# Patient Record
Sex: Female | Born: 1935 | Race: Black or African American | Hispanic: No | Marital: Married | Smoking: Never smoker
Health system: Southern US, Community
[De-identification: ages and names within clinical notes are randomized; demographics above are authoritative.]

## PROBLEM LIST (undated history)

## (undated) DIAGNOSIS — K222 Esophageal obstruction: Secondary | ICD-10-CM

## (undated) DIAGNOSIS — T8859XA Other complications of anesthesia, initial encounter: Secondary | ICD-10-CM

## (undated) DIAGNOSIS — K2940 Chronic atrophic gastritis without bleeding: Secondary | ICD-10-CM

## (undated) DIAGNOSIS — K219 Gastro-esophageal reflux disease without esophagitis: Secondary | ICD-10-CM

## (undated) DIAGNOSIS — I493 Ventricular premature depolarization: Secondary | ICD-10-CM

## (undated) DIAGNOSIS — K902 Blind loop syndrome, not elsewhere classified: Secondary | ICD-10-CM

## (undated) DIAGNOSIS — I10 Essential (primary) hypertension: Secondary | ICD-10-CM

## (undated) DIAGNOSIS — K760 Fatty (change of) liver, not elsewhere classified: Secondary | ICD-10-CM

## (undated) DIAGNOSIS — T4145XA Adverse effect of unspecified anesthetic, initial encounter: Secondary | ICD-10-CM

## (undated) DIAGNOSIS — I341 Nonrheumatic mitral (valve) prolapse: Secondary | ICD-10-CM

## (undated) DIAGNOSIS — D649 Anemia, unspecified: Secondary | ICD-10-CM

## (undated) DIAGNOSIS — K449 Diaphragmatic hernia without obstruction or gangrene: Secondary | ICD-10-CM

## (undated) DIAGNOSIS — E538 Deficiency of other specified B group vitamins: Secondary | ICD-10-CM

## (undated) DIAGNOSIS — K56609 Unspecified intestinal obstruction, unspecified as to partial versus complete obstruction: Secondary | ICD-10-CM

## (undated) DIAGNOSIS — M1990 Unspecified osteoarthritis, unspecified site: Secondary | ICD-10-CM

## (undated) DIAGNOSIS — F419 Anxiety disorder, unspecified: Secondary | ICD-10-CM

## (undated) DIAGNOSIS — T7840XA Allergy, unspecified, initial encounter: Secondary | ICD-10-CM

## (undated) HISTORY — PX: TUBAL LIGATION: SHX77

## (undated) HISTORY — PX: REPLACEMENT TOTAL KNEE: SUR1224

## (undated) HISTORY — DX: Nonrheumatic mitral (valve) prolapse: I34.1

## (undated) HISTORY — DX: Anxiety disorder, unspecified: F41.9

## (undated) HISTORY — DX: Chronic atrophic gastritis without bleeding: K29.40

## (undated) HISTORY — DX: Essential (primary) hypertension: I10

## (undated) HISTORY — PX: HEMICOLECTOMY: SHX854

## (undated) HISTORY — DX: Anemia, unspecified: D64.9

## (undated) HISTORY — DX: Ventricular premature depolarization: I49.3

## (undated) HISTORY — DX: Diaphragmatic hernia without obstruction or gangrene: K44.9

## (undated) HISTORY — DX: Blind loop syndrome, not elsewhere classified: K90.2

## (undated) HISTORY — DX: Fatty (change of) liver, not elsewhere classified: K76.0

## (undated) HISTORY — DX: Esophageal obstruction: K22.2

## (undated) HISTORY — DX: Unspecified osteoarthritis, unspecified site: M19.90

## (undated) HISTORY — DX: Deficiency of other specified B group vitamins: E53.8

## (undated) HISTORY — DX: Gastro-esophageal reflux disease without esophagitis: K21.9

## (undated) HISTORY — DX: Allergy, unspecified, initial encounter: T78.40XA

---

## 1997-08-20 ENCOUNTER — Other Ambulatory Visit: Admission: RE | Admit: 1997-08-20 | Discharge: 1997-08-20 | Payer: Self-pay | Admitting: Obstetrics and Gynecology

## 1998-08-27 ENCOUNTER — Other Ambulatory Visit: Admission: RE | Admit: 1998-08-27 | Discharge: 1998-08-27 | Payer: Self-pay | Admitting: Obstetrics and Gynecology

## 1998-11-02 ENCOUNTER — Emergency Department (HOSPITAL_COMMUNITY): Admission: EM | Admit: 1998-11-02 | Discharge: 1998-11-02 | Payer: Self-pay | Admitting: *Deleted

## 1998-11-09 ENCOUNTER — Emergency Department (HOSPITAL_COMMUNITY): Admission: EM | Admit: 1998-11-09 | Discharge: 1998-11-09 | Payer: Self-pay | Admitting: Emergency Medicine

## 1999-09-01 ENCOUNTER — Other Ambulatory Visit: Admission: RE | Admit: 1999-09-01 | Discharge: 1999-09-01 | Payer: Self-pay | Admitting: Obstetrics and Gynecology

## 2000-09-06 ENCOUNTER — Other Ambulatory Visit: Admission: RE | Admit: 2000-09-06 | Discharge: 2000-09-06 | Payer: Self-pay | Admitting: Obstetrics and Gynecology

## 2001-11-15 ENCOUNTER — Encounter: Payer: Self-pay | Admitting: Emergency Medicine

## 2001-11-15 ENCOUNTER — Emergency Department (HOSPITAL_COMMUNITY): Admission: EM | Admit: 2001-11-15 | Discharge: 2001-11-15 | Payer: Self-pay | Admitting: Emergency Medicine

## 2002-01-06 ENCOUNTER — Encounter: Payer: Self-pay | Admitting: Emergency Medicine

## 2002-01-06 ENCOUNTER — Emergency Department (HOSPITAL_COMMUNITY): Admission: EM | Admit: 2002-01-06 | Discharge: 2002-01-06 | Payer: Self-pay | Admitting: Emergency Medicine

## 2002-07-17 ENCOUNTER — Encounter: Payer: Self-pay | Admitting: Otolaryngology

## 2002-07-17 ENCOUNTER — Encounter: Admission: RE | Admit: 2002-07-17 | Discharge: 2002-07-17 | Payer: Self-pay | Admitting: Otolaryngology

## 2003-06-25 ENCOUNTER — Ambulatory Visit (HOSPITAL_COMMUNITY): Admission: RE | Admit: 2003-06-25 | Discharge: 2003-06-25 | Payer: Self-pay | Admitting: Orthopedic Surgery

## 2003-07-25 ENCOUNTER — Inpatient Hospital Stay (HOSPITAL_COMMUNITY): Admission: RE | Admit: 2003-07-25 | Discharge: 2003-07-26 | Payer: Self-pay | Admitting: Orthopedic Surgery

## 2004-04-29 ENCOUNTER — Ambulatory Visit: Payer: Self-pay | Admitting: Gastroenterology

## 2004-07-01 ENCOUNTER — Ambulatory Visit (HOSPITAL_COMMUNITY): Admission: RE | Admit: 2004-07-01 | Discharge: 2004-07-01 | Payer: Self-pay | Admitting: Internal Medicine

## 2004-08-13 ENCOUNTER — Ambulatory Visit: Payer: Self-pay | Admitting: Gastroenterology

## 2004-09-09 ENCOUNTER — Ambulatory Visit: Payer: Self-pay | Admitting: Gastroenterology

## 2004-12-16 ENCOUNTER — Ambulatory Visit: Payer: Self-pay | Admitting: Gastroenterology

## 2005-01-09 ENCOUNTER — Ambulatory Visit: Payer: Self-pay | Admitting: Gastroenterology

## 2005-07-06 ENCOUNTER — Ambulatory Visit (HOSPITAL_COMMUNITY): Admission: RE | Admit: 2005-07-06 | Discharge: 2005-07-06 | Payer: Self-pay | Admitting: Internal Medicine

## 2005-08-21 ENCOUNTER — Ambulatory Visit: Payer: Self-pay | Admitting: Gastroenterology

## 2005-08-26 ENCOUNTER — Ambulatory Visit: Payer: Self-pay | Admitting: Gastroenterology

## 2006-07-28 ENCOUNTER — Emergency Department (HOSPITAL_COMMUNITY): Admission: EM | Admit: 2006-07-28 | Discharge: 2006-07-28 | Payer: Self-pay | Admitting: Family Medicine

## 2006-08-27 ENCOUNTER — Encounter: Payer: Self-pay | Admitting: Gastroenterology

## 2007-07-25 ENCOUNTER — Encounter: Payer: Self-pay | Admitting: Gastroenterology

## 2007-07-25 DIAGNOSIS — K2940 Chronic atrophic gastritis without bleeding: Secondary | ICD-10-CM | POA: Insufficient documentation

## 2007-07-25 DIAGNOSIS — K219 Gastro-esophageal reflux disease without esophagitis: Secondary | ICD-10-CM

## 2007-07-25 DIAGNOSIS — E538 Deficiency of other specified B group vitamins: Secondary | ICD-10-CM

## 2007-07-25 DIAGNOSIS — K630 Abscess of intestine: Secondary | ICD-10-CM

## 2007-07-25 DIAGNOSIS — I4949 Other premature depolarization: Secondary | ICD-10-CM

## 2007-07-25 DIAGNOSIS — K449 Diaphragmatic hernia without obstruction or gangrene: Secondary | ICD-10-CM | POA: Insufficient documentation

## 2007-07-25 DIAGNOSIS — I059 Rheumatic mitral valve disease, unspecified: Secondary | ICD-10-CM | POA: Insufficient documentation

## 2007-07-25 DIAGNOSIS — I10 Essential (primary) hypertension: Secondary | ICD-10-CM

## 2007-07-25 DIAGNOSIS — K210 Gastro-esophageal reflux disease with esophagitis: Secondary | ICD-10-CM

## 2007-07-26 ENCOUNTER — Ambulatory Visit: Payer: Self-pay | Admitting: Gastroenterology

## 2007-07-26 DIAGNOSIS — R498 Other voice and resonance disorders: Secondary | ICD-10-CM | POA: Insufficient documentation

## 2007-08-29 ENCOUNTER — Telehealth: Payer: Self-pay | Admitting: Gastroenterology

## 2008-07-17 ENCOUNTER — Ambulatory Visit: Payer: Self-pay | Admitting: Gastroenterology

## 2008-07-17 DIAGNOSIS — R197 Diarrhea, unspecified: Secondary | ICD-10-CM

## 2008-07-19 ENCOUNTER — Telehealth: Payer: Self-pay | Admitting: Gastroenterology

## 2008-12-24 ENCOUNTER — Telehealth: Payer: Self-pay | Admitting: Gastroenterology

## 2008-12-25 ENCOUNTER — Encounter: Payer: Self-pay | Admitting: Gastroenterology

## 2008-12-25 ENCOUNTER — Ambulatory Visit: Payer: Self-pay | Admitting: Internal Medicine

## 2008-12-25 DIAGNOSIS — R1319 Other dysphagia: Secondary | ICD-10-CM

## 2008-12-25 DIAGNOSIS — R634 Abnormal weight loss: Secondary | ICD-10-CM

## 2008-12-25 LAB — CONVERTED CEMR LAB
ALT: 20 units/L (ref 0–35)
AST: 24 units/L (ref 0–37)
Albumin: 4 g/dL (ref 3.5–5.2)
Alkaline Phosphatase: 47 units/L (ref 39–117)
Basophils Absolute: 0 10*3/uL (ref 0.0–0.1)
Calcium: 9.6 mg/dL (ref 8.4–10.5)
Creatinine, Ser: 1.1 mg/dL (ref 0.4–1.2)
Eosinophils Absolute: 0.1 10*3/uL (ref 0.0–0.7)
GFR calc non Af Amer: 61.7 mL/min (ref >60)
Glucose, Bld: 98 mg/dL (ref 70–99)
HCT: 37.4 % (ref 36.0–46.0)
Hemoglobin: 12.4 g/dL (ref 12.0–15.0)
Lymphs Abs: 0.8 10*3/uL (ref 0.7–4.0)
Monocytes Absolute: 0.2 10*3/uL (ref 0.1–1.0)
Neutro Abs: 2.2 10*3/uL (ref 1.4–7.7)
Neutrophils Relative %: 69.6 % (ref 43.0–77.0)
Potassium: 4.7 meq/L (ref 3.5–5.1)
RBC: 4.35 M/uL (ref 3.87–5.11)
Total Bilirubin: 0.6 mg/dL (ref 0.3–1.2)
Total Protein: 7.2 g/dL (ref 6.0–8.3)
WBC: 3.3 10*3/uL — ABNORMAL LOW (ref 4.5–10.5)

## 2008-12-26 ENCOUNTER — Ambulatory Visit: Payer: Self-pay | Admitting: Gastroenterology

## 2008-12-26 DIAGNOSIS — K2970 Gastritis, unspecified, without bleeding: Secondary | ICD-10-CM | POA: Insufficient documentation

## 2008-12-26 DIAGNOSIS — K2990 Gastroduodenitis, unspecified, without bleeding: Secondary | ICD-10-CM

## 2008-12-31 ENCOUNTER — Encounter: Payer: Self-pay | Admitting: Gastroenterology

## 2009-07-17 ENCOUNTER — Telehealth: Payer: Self-pay | Admitting: Gastroenterology

## 2009-08-19 ENCOUNTER — Telehealth: Payer: Self-pay | Admitting: Gastroenterology

## 2009-08-26 ENCOUNTER — Ambulatory Visit: Payer: Self-pay | Admitting: Gastroenterology

## 2009-08-26 DIAGNOSIS — K902 Blind loop syndrome, not elsewhere classified: Secondary | ICD-10-CM

## 2009-08-26 LAB — CONVERTED CEMR LAB: Magnesium: 1.9 mg/dL (ref 1.5–2.5)

## 2009-09-03 ENCOUNTER — Ambulatory Visit (HOSPITAL_COMMUNITY): Admission: RE | Admit: 2009-09-03 | Discharge: 2009-09-03 | Payer: Self-pay | Admitting: Gastroenterology

## 2009-09-09 ENCOUNTER — Ambulatory Visit: Payer: Self-pay | Admitting: Cardiovascular Disease

## 2009-09-20 ENCOUNTER — Telehealth (INDEPENDENT_AMBULATORY_CARE_PROVIDER_SITE_OTHER): Payer: Self-pay | Admitting: *Deleted

## 2010-01-23 ENCOUNTER — Encounter: Payer: Self-pay | Admitting: Gastroenterology

## 2010-01-23 ENCOUNTER — Encounter (INDEPENDENT_AMBULATORY_CARE_PROVIDER_SITE_OTHER): Payer: Self-pay | Admitting: *Deleted

## 2010-01-23 ENCOUNTER — Ambulatory Visit
Admission: RE | Admit: 2010-01-23 | Discharge: 2010-01-23 | Payer: Self-pay | Source: Home / Self Care | Attending: Gastroenterology | Admitting: Gastroenterology

## 2010-01-23 ENCOUNTER — Other Ambulatory Visit: Payer: Self-pay | Admitting: Gastroenterology

## 2010-01-23 DIAGNOSIS — R1310 Dysphagia, unspecified: Secondary | ICD-10-CM | POA: Insufficient documentation

## 2010-01-23 LAB — HEPATIC FUNCTION PANEL
ALT: 22 U/L (ref 0–35)
AST: 28 U/L (ref 0–37)
Albumin: 3.8 g/dL (ref 3.5–5.2)
Alkaline Phosphatase: 60 U/L (ref 39–117)
Bilirubin, Direct: 0.1 mg/dL (ref 0.0–0.3)
Total Bilirubin: 0.3 mg/dL (ref 0.3–1.2)
Total Protein: 7.2 g/dL (ref 6.0–8.3)

## 2010-01-23 LAB — CBC WITH DIFFERENTIAL/PLATELET
Basophils Absolute: 0 10*3/uL (ref 0.0–0.1)
Basophils Relative: 0 % (ref 0.0–3.0)
Eosinophils Absolute: 0.1 10*3/uL (ref 0.0–0.7)
Eosinophils Relative: 1.2 % (ref 0.0–5.0)
HCT: 37.1 % (ref 36.0–46.0)
Hemoglobin: 12.2 g/dL (ref 12.0–15.0)
Lymphocytes Relative: 20.7 % (ref 12.0–46.0)
Lymphs Abs: 0.9 10*3/uL (ref 0.7–4.0)
MCHC: 32.9 g/dL (ref 30.0–36.0)
MCV: 85.8 fl (ref 78.0–100.0)
Monocytes Absolute: 0.3 10*3/uL (ref 0.1–1.0)
Monocytes Relative: 5.9 % (ref 3.0–12.0)
Neutro Abs: 3.1 10*3/uL (ref 1.4–7.7)
Neutrophils Relative %: 72.2 % (ref 43.0–77.0)
Platelets: 222 10*3/uL (ref 150.0–400.0)
RBC: 4.32 Mil/uL (ref 3.87–5.11)
RDW: 14.7 % — ABNORMAL HIGH (ref 11.5–14.6)
WBC: 4.3 10*3/uL — ABNORMAL LOW (ref 4.5–10.5)

## 2010-01-23 LAB — BASIC METABOLIC PANEL
BUN: 10 mg/dL (ref 6–23)
CO2: 24 mEq/L (ref 19–32)
Calcium: 9.4 mg/dL (ref 8.4–10.5)
Chloride: 103 mEq/L (ref 96–112)
Creatinine, Ser: 0.9 mg/dL (ref 0.4–1.2)
GFR: 75.62 mL/min (ref >60.00)
Glucose, Bld: 89 mg/dL (ref 70–99)
Potassium: 4.6 mEq/L (ref 3.5–5.1)
Sodium: 135 mEq/L (ref 135–145)

## 2010-01-23 LAB — TSH: TSH: 1.79 u[IU]/mL (ref 0.35–5.50)

## 2010-01-23 LAB — IBC PANEL
Iron: 61 ug/dL (ref 42–145)
Saturation Ratios: 13.6 % — ABNORMAL LOW (ref 20.0–50.0)
Transferrin: 319.3 mg/dL (ref 212.0–360.0)

## 2010-01-23 LAB — SEDIMENTATION RATE: Sed Rate: 29 mm/hr — ABNORMAL HIGH (ref 0–22)

## 2010-01-23 LAB — IGA: IgA: 275 mg/dL (ref 68–378)

## 2010-01-23 LAB — FOLATE: Folate: 11.7 ng/mL

## 2010-01-23 LAB — FERRITIN: Ferritin: 30.4 ng/mL (ref 10.0–291.0)

## 2010-01-23 LAB — VITAMIN B12: Vitamin B-12: 620 pg/mL (ref 211–911)

## 2010-01-23 LAB — CONVERTED CEMR LAB: Tissue Transglutaminase Ab, IgA: 4.6 units (ref <20)

## 2010-01-27 ENCOUNTER — Ambulatory Visit
Admission: RE | Admit: 2010-01-27 | Discharge: 2010-01-27 | Payer: Self-pay | Source: Home / Self Care | Attending: Gastroenterology | Admitting: Gastroenterology

## 2010-01-27 ENCOUNTER — Encounter: Payer: Self-pay | Admitting: Gastroenterology

## 2010-02-20 NOTE — Assessment & Plan Note (Signed)
Summary: food getting stuck, diarrhea, stomach pain, horseness..em    History of Present Illness Visit Type: Follow-up Visit Primary GI MD: Sheryn Bison MD FACP FAGA Primary Provider: Creola Corn, MD Requesting Provider: na  Chief Complaint: dysphagia, diarrhea, some hoarseness History of Present Illness:   77 year old African American female with chronic active liver growth related to previous right hemicolectomy. She continued with gas, bloating, and constipation. She also complains of new onset dysphagia for solid foods in her retrosternal area. She had endoscopy and dilation one year ago. She has known lactose intolerance and has chronic malabsorption of non-digested carbohydrates. With her gas ablating his vague abdominal discomfort but no rectal bleeding. She is up-to-date on her colonoscopy exams. She denies anorexia, weight loss, or systemic complaints.   GI Review of Systems    Reports abdominal pain, acid reflux, bloating, and  dysphagia with solids.     Location of  Abdominal pain: lower abdomen.    Denies belching, chest pain, dysphagia with liquids, heartburn, loss of appetite, nausea, vomiting, vomiting blood, weight loss, and  weight gain.      Reports diarrhea.     Denies anal fissure, black tarry stools, change in bowel habit, constipation, diverticulosis, fecal incontinence, heme positive stool, hemorrhoids, irritable bowel syndrome, jaundice, light color stool, liver problems, rectal bleeding, and  rectal pain.    Current Medications (verified): 1)  Altace 10 Mg  Caps (Ramipril) .... One Tablet By Mouth Two Times A Day 2)  Vitamin C 500 Mg  Tabs (Ascorbic Acid) .... One Tablet By Mouth Once Daily 3)  Tylenol Ex St Arthritis Pain 500 Mg  Tabs (Acetaminophen) .... Two Tablets By Mouth Once Daily 4)  Omega 3 Capsule (Unknown Dosage) .... Take 3 Capsules By Mouth Once Daily 5)  Vitamin D (Ergocalciferol) 50000 Unit Caps (Ergocalciferol) .... Take 1 Tablet By Mouth Every  Week 6)  Dexilant 60 Mg Cpdr (Dexlansoprazole) .... Take 1 Capsule Daily 30 Min Prior To Breakfast 7)  Ensure  Liqd (Nutritional Supplements) .... One in The Morning and One At Night 8)  [facility] Colon Health  Caps (Probiotic Product) .... Take 1 Capsule By Mouth Two Times A Day 9)  Alprazolam 0.25 Mg Tabs (Alprazolam) .... Take 1 Tablet By Mouth Once Daily As Needed 10)  Align 4 Mg Caps (Probiotic Product) .... Take 1 Capsule By Mouth Once Daily 11)  Alka-Seltzer [phone] Mg Tbef (Aspirin Effervescent) .... As Needed 12)  Tums Calcium For Life Bone 750 Mg Chew (Calcium Carbonate Antacid) .... As Needed 13)  Beano To Go  Tabs (Alpha-D-Galactosidase) .... As Needed 14)  Geri-Lanta 200-200-20 Mg/73ml Susp (Alum & Mag Hydroxide-Simeth) .... As Needed  Allergies (verified): No Known Drug Allergies  Past History:  Past medical, surgical, family and social histories (including risk factors) reviewed for relevance to current acute and chronic problems.  Past Medical History: Reviewed history from 07/26/2007 and no changes required. Current Problems:  HOARSENESS (ICD-784.49) ABSCESS OF INTESTINE (ICD-569.5) PREMATURE VENTRICULAR CONTRACTIONS (ICD-427.69) MITRAL VALVE PROLAPSE (ICD-424.0) B12 DEFICIENCY (ICD-266.2) HYPERTENSION (ICD-401.9) GERD (ICD-530.81) HIATAL HERNIA (ICD-553.3) ESOPHAGITIS, REFLUX (ICD-530.11) GASTRITIS, CHRONIC (ICD-535.10)  Past Surgical History: Reviewed history from 07/25/2007 and no changes required. right hemicolectomy and ileectomy  Dr. Rolene Course  Family History: Reviewed history from 07/17/2008 and no changes required. Family History of Diabetes: Sister Family History of Heart Disease: Brother, Neices x 2 No FH of Colon Cancer: Family History of Kidney Disease: Neice x 2  Social History: Reviewed history from 07/17/2008 and no changes required.  Alcohol Use - no Occupation: retired Patient has never smoked.  Illicit Drug Use - no Patient gets  regular exercise.  Review of Systems  The patient denies allergy/sinus, anemia, anxiety-new, arthritis/joint pain, back pain, blood in urine, breast changes/lumps, change in vision, confusion, cough, coughing up blood, depression-new, fainting, fatigue, fever, headaches-new, hearing problems, heart murmur, heart rhythm changes, itching, menstrual pain, muscle pains/cramps, night sweats, nosebleeds, pregnancy symptoms, shortness of breath, skin rash, sleeping problems, sore throat, swelling of feet/legs, swollen lymph glands, thirst - excessive , urination - excessive , urination changes/pain, urine leakage, vision changes, and voice change.    Vital Signs:  Patient profile:   77 year old female Height:      65 inches Weight:      161.25 pounds BMI:     26.93 Pulse rate:   68 / minute Pulse rhythm:   regular BP sitting:   128 / 80  (left arm) Cuff size:   regular  Vitals Entered By: June McMurray CMA Duncan Dull) (January 23, 2010 10:29 AM)  Physical Exam  General:  Well developed, well nourished, no acute distress.healthy appearing. She appears younger than her stated age.  Head:  Normocephalic and atraumatic. Eyes:  PERRLA, no icterus.exam deferred to patient's ophthalmologist.   Neck:  Supple; no masses or thyromegaly. Lungs:  Clear throughout to auscultation. Heart:  Regular rate and rhythm; no murmurs, rubs,  or bruits. Abdomen:  Soft, nontender and nondistended. No masses, hepatosplenomegaly or hernias noted. Normal bowel sounds. Extremities:  No clubbing, cyanosis, edema or deformities noted. Neurologic:  Alert and  oriented x4;  grossly normal neurologically. Cervical Nodes:  No significant cervical adenopathy. Psych:  Alert and cooperative. Normal mood and affect.   Impression & Recommendations:  Problem # 1:  DYSPHAGIA UNSPECIFIED (ICD-787.20) Assessment Deteriorated Probable recurrent esophageal stricture from chronic GERD. She requested discontinuation of Dexilant and trial  of Nexium 40 mg a day. Endoscopy and esophageal dilation schedule her convenience. Standard anti-reflux maneuvers reviewed. Orders: TLB-CBC Platelet - w/Differential (85025-CBCD) TLB-BMP (Basic Metabolic Panel-BMET) (80048-METABOL) TLB-Hepatic/Liver Function Pnl (80076-HEPATIC) TLB-TSH (Thyroid Stimulating Hormone) (84443-TSH) TLB-B12, Serum-Total ONLY (29562-Z30) TLB-Ferritin (82728-FER) TLB-Folic Acid (Folate) (82746-FOL) TLB-IBC Pnl (Iron/FE;Transferrin) (83550-IBC) TLB-Sedimentation Rate (ESR) (85652-ESR) T-Beta Carotene [phone]) T-Sprue Panel (Celiac Disease Aby Eval) (83516x3/[address]) TLB-IgA (Immunoglobulin A) (82784-IGA) EGD (EGD)  Problem # 2:  BLIND LOOP SYNDROME (ICD-579.2) Assessment: Deteriorated Trial of Xifaxan 550 mg b.i.d. for 2 weeks. We will continue probiotic therapy. She has had excellent response in the past to this therapy. Also advised to continue Ensure supplementation and multivitamins.  Problem # 3:  WEIGHT LOSS-ABNORMAL (ICD-783.21) Assessment: Improved  Problem # 4:  HOARSENESS (ICD-784.49) Assessment: Improved Related chronic GERD and seems to be improving with regular PPI therapy.  Problem # 5:  B12 DEFICIENCY (ICD-266.2) Assessment: Improved Continue parenteral replacement therapy as needed. His B12 deficiency is associated with her bacterial overgrowth syndrome.Repeat anemia profile ordered.  Patient Instructions: 1)  Copy sent to : Creola Corn, MD 2)  Your prescription(s) have been sent to you pharmacy.  3)  Please go to the basement today for your labs.  4)  Your procedure has been scheduled for 01/27/2010, please follow the seperate instructions.  5)  [facility] Patient Information Guide given to patient.  6)  Upper Endoscopy brochure given.  7)  The medication list was reviewed and reconciled.  All changed / newly prescribed medications were explained.  A complete medication list was provided to the patient / caregiver. 8)  Avoid foods high in acid content ( tomatoes, citrus juices, spicy foods) . Avoid eating within 3 to 4 hours of lying down or before exercising. Do not over eat; try smaller more frequent meals. Elevate head of bed four inches when sleeping.  Prescriptions: XIFAXAN 550 MG TABS (RIFAXIMIN) take one by mouth two times a day for 10 days  #20 x 0   Entered by:   Harlow Mares CMA (AAMA)   Authorized by:   Mardella Layman MD [facility]   Signed by:   Harlow Mares CMA (AAMA) on 01/23/2010   Method used:   Electronically to        [facility]  [address] [phone]* (retail)       [address]       [place], [place]  [id number]       Ph: [phone] or [phone]       Fax: [phone]   RxID:   [id number]

## 2010-02-20 NOTE — Progress Notes (Signed)
Summary: Samples of Dexilant  Medications Added DEXILANT 60 MG CPDR (DEXLANSOPRAZOLE) Take 1 capsule daily 30 min prior to breakfast       Phone Note Call from Patient Call back at Home Phone [phone]   Caller: Patient Call For: Dr. Jarold Motto Reason for Call: Talk to Nurse Summary of Call: pt is asking for samples of Dexilant Initial call taken by: Karna Christmas,  July 17, 2009 2:40 PM  Follow-up for Phone Call        Samples given. Follow-up by: Ashok Cordia RN,  July 17, 2009 3:21 PM    New/Updated Medications: DEXILANT 60 MG CPDR (DEXLANSOPRAZOLE) Take 1 capsule daily 30 min prior to breakfast

## 2010-02-20 NOTE — Assessment & Plan Note (Signed)
Summary: Loss of appetite/dfs    History of Present Illness Visit Type: Follow-up Visit Primary GI MD: Sheryn Bison MD FACP FAGA Primary Provider: Creola Corn, MD Requesting Provider: na  Chief Complaint: loss of appetite and bad taste in her mouth  History of Present Illness:   pleasant 77 year old African American female followed for many years with bacterial overgrowth related to previous right hemicolectomy from an ileal abscess many years ago. She is up-to-date on her colonoscopy exams. She is followed by Dr. Timothy Lasso in  primary care and otherwise is doing well taking daily Dexilant 60 mg for chronic GERD, Altace 10 mg a day for hypertension, and vitamin D.  Then a gastrointestinal complaints except for loss of appetite but no associated weight loss. She is taking Ensure one can twice a day. Chronically she has gas and bloating but denies current bowel irregularity, melena or hematochezia. She is on B12 replacement therapy. Last ultrasound was 7 years ago.She Denies any hepatobiliary symptomatology.   GI Review of Systems    Reports loss of appetite.      Denies abdominal pain, acid reflux, belching, bloating, chest pain, dysphagia with liquids, dysphagia with solids, heartburn, nausea, vomiting, vomiting blood, weight loss, and  weight gain.        Denies anal fissure, black tarry stools, change in bowel habit, constipation, diarrhea, diverticulosis, fecal incontinence, heme positive stool, hemorrhoids, irritable bowel syndrome, jaundice, light color stool, liver problems, rectal bleeding, and  rectal pain.    Current Medications (verified): 1)  Altace 10 Mg  Caps (Ramipril) .... One Tablet By Mouth Two Times A Day 2)  Vitamin C 500 Mg  Tabs (Ascorbic Acid) .... One Tablet By Mouth Once Daily 3)  Tylenol Ex St Arthritis Pain 500 Mg  Tabs (Acetaminophen) .... Two Tablets By Mouth Once Daily 4)  Omega 3 Capsule (Unknown Dosage) .... Take 3 Capsules By Mouth Once Daily 5)  Vitamin D  (Ergocalciferol) 50000 Unit Caps (Ergocalciferol) .... Take 1 Tablet By Mouth Every Week 6)  Dexilant 60 Mg Cpdr (Dexlansoprazole) .... Take 1 Capsule Daily 30 Min Prior To Breakfast 7)  Ensure  Liqd (Nutritional Supplements) .... One in The Morning and One At Night  Allergies (verified): No Known Drug Allergies  Past History:  Past medical, surgical, family and social histories (including risk factors) reviewed for relevance to current acute and chronic problems.  Past Medical History: Reviewed history from 07/26/2007 and no changes required. Current Problems:  HOARSENESS (ICD-784.49) ABSCESS OF INTESTINE (ICD-569.5) PREMATURE VENTRICULAR CONTRACTIONS (ICD-427.69) MITRAL VALVE PROLAPSE (ICD-424.0) B12 DEFICIENCY (ICD-266.2) HYPERTENSION (ICD-401.9) GERD (ICD-530.81) HIATAL HERNIA (ICD-553.3) ESOPHAGITIS, REFLUX (ICD-530.11) GASTRITIS, CHRONIC (ICD-535.10)  Past Surgical History: Reviewed history from 07/25/2007 and no changes required. right hemicolectomy and ileectomy  Dr. Rolene Course  Family History: Reviewed history from 07/17/2008 and no changes required. Family History of Diabetes: Sister Family History of Heart Disease: Brother, Neices x 2 No FH of Colon Cancer: Family History of Kidney Disease: Neice x 2  Social History: Reviewed history from 07/17/2008 and no changes required. Alcohol Use - no Occupation: retired Patient has never smoked.  Illicit Drug Use - no Patient gets regular exercise.  Review of Systems       The patient complains of anxiety-new, arthritis/joint pain, back pain, depression-new, fatigue, and headaches-new.  The patient denies allergy/sinus, anemia, blood in urine, breast changes/lumps, change in vision, confusion, cough, coughing up blood, fainting, fever, hearing problems, heart murmur, heart rhythm changes, itching, menstrual pain, muscle pains/cramps, night sweats, nosebleeds,  pregnancy symptoms, shortness of breath, skin rash, sleeping  problems, sore throat, swelling of feet/legs, swollen lymph glands, thirst - excessive , urination - excessive , urination changes/pain, urine leakage, vision changes, and voice change.    Vital Signs:  Patient profile:   77 year old female Height:      65 inches Weight:      164 pounds BMI:     27.39 BSA:     1.82 Pulse rate:   76 / minute Pulse rhythm:   regular BP sitting:   132 / 84  (left arm) Cuff size:   regular  Vitals Entered By: Ok Anis CMA (August 26, 2009 2:26 PM)  Physical Exam  General:  Well developed, well nourished, no acute distress.healthy appearing.   Head:  Normocephalic and atraumatic. Eyes:  PERRLA, no icterus.exam deferred to patient's ophthalmologist.   Mouth:  No deformity or lesions, dentition normal. Lungs:  Clear throughout to auscultation. Heart:  Regular rate and rhythm; no murmurs, rubs,  or bruits. Abdomen:  Soft, nontender and nondistended. No masses, hepatosplenomegaly or hernias noted. Normal bowel sounds.Multiple well-healed surgical scars visible. Bowel sounds are nonobstructive in nature. Extremities:  No clubbing, cyanosis, edema or deformities noted. Neurologic:  Alert and  oriented x4;  grossly normal neurologically. Psych:  Alert and cooperative. Normal mood and affect.   Impression & Recommendations:  Problem # 1:  BLIND LOOP SYNDROME (ICD-579.2) Assessment Improved Trial of [employer] Colon Health probiotic tabs twice a day. Orders: TLB-Magnesium (Mg) (83735-MG) T- * Misc. Laboratory test [phone]) T- * Misc. Laboratory test [phone]) Ultrasound Abdomen (UAS)  Problem # 2:  WEIGHT LOSS-ABNORMAL (ICD-783.21) ultrasound tech that of upper abdomen scheduled. Orders: TLB-Magnesium (Mg) (83735-MG) T- * Misc. Laboratory test [phone]) T- * Misc. Laboratory test [phone]) Ultrasound Abdomen (UAS)  Problem # 3:  B12 DEFICIENCY (ICD-266.2) Assessment: Improved Continue replacement therapy. Because of a constant bad taste in her mouth  despite PPI therapy have ordered serum zinc, magnesium, and thiamine levels.  Problem # 4:  GERD (ICD-530.81) Assessment: Improved Continue Exelon 60 mg a day as tolerated and Ensure p.o. supplementation.  Patient Instructions: 1)  Please go to the basement to have your lab tests drawn today.  2)  Your Abdominal Ultrasound is scheduled at [facility].  See seperate instructions. 3)  We will call you with further follow up after reviewing these results. 4)  Please purchase [employer] [facility] at your pharmacy.  You will need to take this two times a day. 5)  The medication list was reviewed and reconciled.  All changed / newly prescribed medications were explained.  A complete medication list was provided to the patient / caregiver. 6)  Copy sent to : Dr. Creola Corn 7)  Please continue current medications.

## 2010-02-20 NOTE — Procedures (Signed)
Summary: Upper Endoscopy  Patient: [name] [name] Note: All result statuses are Final unless otherwise noted.  Tests: (1) Upper Endoscopy (EGD)   EGD Upper Endoscopy       DONE     [facility]     [address]     [place], [place]  [address]           ENDOSCOPY PROCEDURE REPORT           PATIENT:  [name] [name], [name] [name]  MR#:  [id number]     BIRTHDATE:  Sep 15, 1928, 81 yrs. old  GENDER:  female           ENDOSCOPIST:  Vania Rea. Jarold Motto, MD, [facility]     Referred by:           PROCEDURE DATE:  01/27/2010     PROCEDURE:  EGD, diagnostic 43235, Maloney Dilation of Esophagus     ASA CLASS:  Class II     INDICATIONS:  dysphagia, early satiety, weight loss           MEDICATIONS:   Fentanyl 50 mcg IV, Versed 5 mg IV     TOPICAL ANESTHETIC:  Exactacain Spray           DESCRIPTION OF PROCEDURE:   After the risks benefits and     alternatives of the procedure were thoroughly explained, informed     consent was obtained.  The LB [id number] endoscope was     introduced through the mouth and advanced to the second portion of     the duodenum, without limitations.  The instrument was slowly     withdrawn as the mucosa was fully examined.     <<PROCEDUREIMAGES>>           A hiatal hernia was found. 4 CM. HH NOTED.DILATED#1F MALONEY     DILATOR.  The upper, middle, and distal third of the esophagus     were carefully inspected and no abnormalities were noted. The     z-line was well seen at the GEJ. The endoscope was pushed into the     fundus which was normal including a retroflexed view. The     antrum,gastric body, first and second part of the duodenum were     unremarkable.    Retroflexed views revealed a hiatal hernia.     The scope was then withdrawn from the patient and the procedure     completed.           COMPLICATIONS:  None           ENDOSCOPIC IMPRESSION:     1) Hiatal hernia     2) Normal EGD     3) A hiatal hernia     CHRONIC GERD AND SECONDARY STRICTURE  DILATED.     RECOMMENDATIONS:     1) Clear liquids until, then soft foods rest iof day. Resume     prior diet tomorrow.     2) continue PPI           REPEAT EXAM:  No           ______________________________     Vania Rea. Jarold Motto, MD, Clement[facility] Graham           CC:  Creola Corn, MD           n.     Rosalie Doctor:   Vania Rea. Michoel Kunin at 01/27/2010 04:03 PM           Maxwell Caul, [id number]  Note: An  exclamation mark (!) indicates a result that was not dispersed into the flowsheet. Document Creation Date: 01/27/2010 4:03 PM _______________________________________________________________________  (1) Order result status: Final Collection or observation date-time: 01/27/2010 15:54 Requested date-time:  Receipt date-time:  Reported date-time:  Referring Physician:   Ordering Physician: Sheryn Bison [phone]) Specimen Source:  Source: Launa Grill Order Number: [id number] Lab site:

## 2010-02-20 NOTE — Progress Notes (Signed)
Summary: Supporting lab dx code  ---- Converted from flag ---- ---- 09/18/2009 9:43 AM, Mardella Layman MD [facility] wrote: CHRONIC MALABSORPTION...  ---- 09/18/2009 8:44 AM, Leodis Liverpool wrote: Could you supply supporting diagnosis codes for Zinc, and VitB1. [facility] did not accept 579.2 and 783.21.Thanks ------------------------------

## 2010-02-20 NOTE — Progress Notes (Signed)
Summary: Triage   Phone Note Call from Patient Call back at Home Phone [phone]   Caller: Patient Call For: Dr. Jarold Motto Reason for Call: Talk to Nurse Summary of Call: pt. has no appetite, bitter taste in her throat.Marland Kitchen...would like to discuss Initial call taken by: Karna Christmas,  August 19, 2009 12:15 PM  Follow-up for Phone Call        Pt c/o loss of appetitie and bitter taste in mouth.  No pain.  REq OV.  Apt sch for 08/26/09 with Dr. Jarold Motto. Follow-up by: Ashok Cordia RN,  August 19, 2009 12:28 PM

## 2010-02-20 NOTE — Miscellaneous (Signed)
Summary: Dexilant samples  Clinical Lists Changes  Medications: Added new medication of DEXILANT 60 MG CPDR (DEXLANSOPRAZOLE) 1 by mouth once daily

## 2010-02-20 NOTE — Letter (Signed)
Summary: EGD Instructions  [facility]  [address] [place], [place] [address]   Phone: [phone]  Fax: [phone]       [name]    Feb 24, 1928    MRN: [id number]       Procedure Day /Date: Monday 01/27/2010     Arrival Time: 2:00pm     Procedure Time: 3:00pm     Location of Procedure:                    [facility] (4th Floor)   PREPARATION FOR ENDOSCOPY   On 01/27/2010 THE DAY OF THE PROCEDURE:  1.   No solid foods, milk or milk products are allowed after midnight the night before your procedure.  2.   Do not drink anything colored red or purple.  Avoid juices with pulp.  No orange juice.  3.  You may drink clear liquids until 1:00pm, which is 2 hours before your procedure.                                                                                                CLEAR LIQUIDS INCLUDE: Water Jello Ice Popsicles Tea (sugar ok, no milk/cream) Powdered fruit flavored drinks Coffee (sugar ok, no milk/cream) Gatorade Juice: apple, white grape, white cranberry  Lemonade Clear bullion, consomm, broth Carbonated beverages (any kind) Strained chicken noodle soup Hard Candy   MEDICATION INSTRUCTIONS  Unless otherwise instructed, you should take regular prescription medications with a small sip of water as early as possible the morning of your procedure.                 OTHER INSTRUCTIONS  You will need a responsible adult at least 75 years of age to accompany you and drive you home.   This person must remain in the waiting room during your procedure.  Wear loose fitting clothing that is easily removed.  Leave jewelry and other valuables at home.  However, you may wish to bring a book to read or an iPod/MP3 player to listen to music as you wait for your procedure to start.  Remove all body piercing jewelry and leave at home.  Total time from sign-in until discharge is approximately 2-3 hours.  You should go home  directly after your procedure and rest.  You can resume normal activities the day after your procedure.  The day of your procedure you should not:   Drive   Make legal decisions   Operate machinery   Drink alcohol   Return to work  You will receive specific instructions about eating, activities and medications before you leave.    The above instructions have been reviewed and explained to me by   _______________________    I fully understand and can verbalize these instructions _____________________________ Date _________

## 2010-03-12 ENCOUNTER — Ambulatory Visit (INDEPENDENT_AMBULATORY_CARE_PROVIDER_SITE_OTHER): Payer: Medicare HMO | Admitting: Cardiovascular Disease

## 2010-03-12 DIAGNOSIS — R079 Chest pain, unspecified: Secondary | ICD-10-CM

## 2010-05-23 ENCOUNTER — Other Ambulatory Visit: Payer: Self-pay | Admitting: Nurse Practitioner

## 2010-06-06 NOTE — Assessment & Plan Note (Signed)
[facility]                           GASTROENTEROLOGY OFFICE NOTE   ELNORA, QUIZON                      MRN:          [id number]  DATE:08/21/2005                            DOB:          12/06/1928    [name] [name] is a 77 year old black female who has had previous right  hemicolectomy and ileectomy because of an ileal abscess many years ago of  unexplained etiology by Dr. Rolene Course.  It was either a perforated appendix or  Crohn's disease and the pathology report is unclear as to what the cause of  her perforation was.  She also had follow-up surgery and had repair of an  ileostomy and had multiple adhesions which were lysed.  She has had  admission since that time for partial small-bowel obstruction secondary to  adhesions but has not been hospitalized in several years.  I have done  frequent colonoscopies on this patient for screening purposes, and her last  exam did show a well formed right hemicolectomy with patent anastomosis.  She has also been treated for chronic acid reflux with chronic PPI therapy.  As mentioned above, last colonoscopic exam was September 2004, and she is  due for follow-up exam at this time.  The last endoscopy was in January of  2004, she had a peptic stricture of her esophagus and was dilated.   She comes to the office today complaining of two weeks of postprandial pain  in her lower abdomen 15 to 30 minutes after eating with associated nausea  but no emesis, change in bowel habits, fever, chills, or systemic  complaints.  She does relate that she has been using a fair amount of NSAIDs  recently.  She has known NSAID sensitivity.  She denies any hepatobiliary  complaints or upper GI problems.   MEDICATIONS:  1.  Altace 100 mg a day for hypertension.  2.  Vitamin C daily.  3.  Caltrate daily.  4.  Protonix 40 mg a day.  5.  Carafate p.r.n.   ALLERGIES:  NO KNOWN DRUG ALLERGIES.   LABORATORY DATA:  Hemoccult cards  were negative in December of 2004.  CBC,  liver function tests, B12 and folate levels have been normal.  Last upper  abdominal ultrasound was in December of 2004 and was normal.   FAMILY HISTORY:  Noncontributory.   PHYSICAL EXAMINATION:  GENERAL APPEARANCE:  She is a healthy-appearing,  nontoxic black female appearing her stated age.  VITAL SIGNS:  The patient weighs 158 pounds.  Her blood pressure is 118/70.  CHEST:  Clear.  CARDIOVASCULAR:  I could not appreciate murmurs, rubs, or gallops.  She has  a regular rhythm.  ABDOMEN:  Multiple well-healed lower abdominal and midline surgical scars.  I could not appreciate any definite distention, organomegaly, masses or  tenderness.  She did have a prominent epigastric bruit.  RECTUM:  Unremarkable except for external hemorrhoids.  There was soft stool  in the rectal vault that was +2 guaiac positive, however.  MENTAL STATUS:  Clear.   ASSESSMENT:  1.  Chronic acid reflux disease doing well on proton pump inhibitor therapy.  2.  Probable nonsteroidal anti-inflammatory drug damage to her lower      gastrointestinal tract accounting for guaiac positive stools and      symptomatology.  3.  Need for further colonoscopy for a vague history of possible      inflammatory bowel disease, although I think this is unlikely.  4.  Well controlled essential hypertension.  5.  Status post right hemicolectomy with a history of some mild anastomotic      stenosis.   RECOMMENDATIONS:  1.  Check laboratory parameters.  2.  Follow-up colonoscopy exam.  3.  Because of her bloating, gas, and symptomatology, I have decided to      empirically treat her for bacterial overgrowth syndrome with Xifaxan 400      mg t.i.d. for 10 days along with Flora-Q probiotic therapy.  4.  Continue other medications as per primary care physician, Dr. Creola Corn.                                   Vania Rea. Jarold Motto, MD, Clementeen Graham, [place]   DRP/MedQ  DD:  08/21/2005  DT:   08/21/2005  Job #:  [id number]   cc:   Gwen Pounds, MD

## 2010-06-06 NOTE — Op Note (Signed)
[name] [name], [name] [name]:  [id number]   MEDICAL RECORD NO.:  [id number]                   PATIENT TYPE:  AMB   LOCATION:  DAY                                  FACILITY:  [facility]   PHYSICIAN:  Marlowe Kays, M.D.               DATE OF BIRTH:  1928-02-10   DATE OF PROCEDURE:  07/25/2003  DATE OF DISCHARGE:                                 OPERATIVE REPORT   PREOPERATIVE DIAGNOSES:  1. Degenerative arthritis acromioclavicular joint.  2. Full-thickness retracted rotator cuff tear, right shoulder.   POSTOPERATIVE DIAGNOSES:  1. Degenerative arthritis acromioclavicular joint.  2. Full-thickness retracted rotator cuff tear, right shoulder.   OPERATION:  Anterior acromionectomy and repair of torn rotator cuff and open  excision distal clavicle right shoulder.   SURGEON:  Marlowe Kays, M.D.   ASSISTANTDruscilla Brownie. Cherlynn June.   ANESTHESIA:  General.   PATHOLOGY AND JUSTIFICATION FOR PROCEDURE:  Progressive pain right shoulder  over the last year.  MR arthrogram has demonstrated a complete retracted  rotator cuff tear.  This is confirmed at surgery as discussed above. She  also had hypertrophic arthritis of the [facility] joint.   DESCRIPTION OF PROCEDURE:  Prophylactic antibiotics, satisfactory general  anesthesia, beach chair position on the Schlein frame. The right shoulder  girdle was prepped with Duraprep, draped in a sterile field, Ioban employed.  The anatomy of the shoulder joint was marked out and a vertical incision was  made going superiorly over the distal clavicle. With subperiosteal  dissection, I exposed the distal clavicle, AC joint and anterior acromion.  I measured off a centimeter and a half from the [facility] joint, undermined the  distal clavicle at that point and protecting it with baby Bennett's resected  the clavicle with a microsaw.  I then removed the distal clavicle grasping  it with a towel clip and excising with cautery.  Small  spicules of bone were  removed from the underneath surface of the distal clavicle and I then  covered the cut bone with bone wax.  I then placed a Cobb elevator beneath  the anterior acromion dissecting off the muscle fibers and with a microsaw  made my initial anterior acromionectomy removing the anterior acromion. I  then undermined with a saw fully decompressing the subacromial joint.  The  rotator cuff was identified and the tear delineated. There was a long  vertical tear going up beneath the clavicle with almost no visible posterior  cuff remaining. I was able to dissect this out from the overlying tissue.  The anterior flap of rotator cuff was very movable and I was able to suture  it to the small posterior remnant with multiple interrupted #1 Ethibond  suture without any undue tension on the repair. I was able to accomplish  this with downward pull on the arm and upward pull with the Sims retractor  on the remaining anterior acromion.  I then worked laterally and used a  super Mitek anchor stabilizing the terminal portions of the two parts of  rotator cuff and then supplement it with additional side to side suture of  both #1 Ethibond and #1 Vicryl.  This gave what I felt to be stable repair.  I then irrigated the wound well with sterile saline. She had had an  interscalene block and I did not infiltrate with Marcaine.  I repaired the  deltoid muscle in two layers with interrupted #1 Vicryl and the fascia over  the distal acromion after placing Gelfoam in the resected site and also  closed the fascia over the anterior acromion with the same and got a nice  tight repair. The subcutaneous tissue was closed with 2-0 Vicryl deep, 4-0  Vicryl subcuticularly and Steri-Strips on the skin. A dry sterile dressing  and shoulder immobilizer applied. She tolerated the procedure well and was  taken to the recovery room in satisfactory condition with no known  complications.                                                Marlowe Kays, M.D.    JA/MEDQ  D:  07/25/2003  T:  07/25/2003  Job:  [id number]

## 2010-09-16 ENCOUNTER — Other Ambulatory Visit: Payer: Self-pay | Admitting: Nurse Practitioner

## 2010-09-17 ENCOUNTER — Other Ambulatory Visit: Payer: Self-pay | Admitting: Nurse Practitioner

## 2010-09-20 HISTORY — PX: OTHER SURGICAL HISTORY: SHX169

## 2010-09-23 ENCOUNTER — Other Ambulatory Visit: Payer: Self-pay | Admitting: Nurse Practitioner

## 2010-09-24 ENCOUNTER — Other Ambulatory Visit: Payer: Self-pay | Admitting: Gastroenterology

## 2010-09-24 NOTE — Telephone Encounter (Signed)
Advised pt that we have been oking rx refills since 05/2010 and pt states that they have not received rx so I called it in, pt aware

## 2010-09-27 ENCOUNTER — Emergency Department (HOSPITAL_COMMUNITY): Payer: Medicare HMO

## 2010-09-27 ENCOUNTER — Inpatient Hospital Stay (HOSPITAL_COMMUNITY)
Admission: EM | Admit: 2010-09-27 | Discharge: 2010-09-29 | DRG: 313 | Disposition: A | Discharge status: Home / Self Care | Payer: Medicare HMO | Attending: Cardiovascular Disease | Admitting: Cardiovascular Disease

## 2010-09-27 DIAGNOSIS — I10 Essential (primary) hypertension: Secondary | ICD-10-CM | POA: Diagnosis present

## 2010-09-27 DIAGNOSIS — K219 Gastro-esophageal reflux disease without esophagitis: Secondary | ICD-10-CM | POA: Diagnosis present

## 2010-09-27 DIAGNOSIS — R197 Diarrhea, unspecified: Secondary | ICD-10-CM | POA: Diagnosis present

## 2010-09-27 DIAGNOSIS — Z79899 Other long term (current) drug therapy: Secondary | ICD-10-CM

## 2010-09-27 DIAGNOSIS — R079 Chest pain, unspecified: Secondary | ICD-10-CM

## 2010-09-27 DIAGNOSIS — E871 Hypo-osmolality and hyponatremia: Secondary | ICD-10-CM | POA: Diagnosis present

## 2010-09-27 DIAGNOSIS — R0789 Other chest pain: Principal | ICD-10-CM | POA: Diagnosis present

## 2010-09-27 LAB — CBC
Hemoglobin: 12.8 g/dL (ref 12.0–15.0)
MCH: 30 pg (ref 26.0–34.0)
MCHC: 34 g/dL (ref 30.0–36.0)
MCV: 88.3 fL (ref 78.0–100.0)
RBC: 4.27 MIL/uL (ref 3.87–5.11)
WBC: 11.8 10*3/uL — ABNORMAL HIGH (ref 4.0–10.5)

## 2010-09-27 LAB — COMPREHENSIVE METABOLIC PANEL
ALT: 23 U/L (ref 0–35)
CO2: 25 mEq/L (ref 19–32)
Calcium: 9.3 mg/dL (ref 8.4–10.5)
Chloride: 92 mEq/L — ABNORMAL LOW (ref 96–112)
GFR calc non Af Amer: 52 mL/min — ABNORMAL LOW (ref >60)
Glucose, Bld: 126 mg/dL — ABNORMAL HIGH (ref 70–99)
Potassium: 4.3 mEq/L (ref 3.5–5.1)
Sodium: 128 mEq/L — ABNORMAL LOW (ref 135–145)

## 2010-09-27 LAB — POCT I-STAT TROPONIN I

## 2010-09-27 LAB — CK TOTAL AND CKMB (NOT AT ARMC): Relative Index: 1.4 (ref 0.0–2.5)

## 2010-09-28 LAB — PROTIME-INR: Prothrombin Time: 13.2 seconds (ref 11.6–15.2)

## 2010-09-28 LAB — LIPID PANEL
HDL: 62 mg/dL (ref >39)
Total CHOL/HDL Ratio: 2.7 RATIO
Triglycerides: 130 mg/dL (ref <150)
VLDL: 26 mg/dL (ref 0–40)

## 2010-09-28 LAB — CARDIAC PANEL(CRET KIN+CKTOT+MB+TROPI)
CK, MB: 3.7 ng/mL (ref 0.3–4.0)
Relative Index: 1.5 (ref 0.0–2.5)
Total CK: 344 U/L — ABNORMAL HIGH (ref 7–177)
Total CK: 295 U/L — ABNORMAL HIGH (ref 7–177)
Total CK: 240 U/L — ABNORMAL HIGH (ref 7–177)

## 2010-09-28 NOTE — H&P (Signed)
NAMERENISHA, COCKRUM NO.:  [id number]  MEDICAL RECORD NO.:  [id number]  LOCATION:  2034                         FACILITY:  [facility]  PHYSICIAN:  Zacarias Pontes, MD       DATE OF BIRTH:  19-Feb-1928  DATE OF ADMISSION:  09/27/2010 DATE OF DISCHARGE:                             HISTORY & PHYSICAL   PRIMARY CARDIOLOGIST:  Vesta Mixer, MD  CHIEF COMPLAINT:  Chest pain.  HISTORY OF PRESENT ILLNESS:  Ms. Goodhart is a pleasant 77 year old woman with no prior coronary history and with an extensive GI history, including GERD and esophageal stricture, who presents with new-onset chest pain.  This week her usually good appetite has been poor.  She has not had any nausea but has had diarrhea and overall has felt tired.  She denies any overt fevers, chills, or night sweats.  On the day of admission, she felt substernal burning, radiating to her back which was slightly worse when lying flat.  It was not pleuritic and was not associated with diaphoresis or shortness of breath.  The pain became significantly more bothersome upon her return from church in the evening and thus she made the decision to seek medical attention for further evaluation and management.  Of note, she did receive nitroglycerin in the emergency room with eventual relief of her chest burning and discomfort.  PAST MEDICAL HISTORY: 1. Hypertension. 2. GERD. 3. Esophageal stricture status post dilatation in the past. 4. Status post right hemicolectomy in the past. 5. Status post partial small bowel obstructions subsequent to her     right hemicolectomy.  SOCIAL HISTORY:  She is a nonsmoker and nondrinker and exercises regularly and takes pride in a heart healthy diet.  FAMILY HISTORY:  Noncontributory.  REVIEW OF SYSTEMS:  As per HPI, otherwise, is comprehensively negative.  ALLERGIES:  No known drug allergies.  MEDICATIONS: 1. Ramipril 10 mg p.o. b.i.d. 2. Flonase inhaler daily. 3.  Acetaminophen as needed for pain. 4. Dexlansoprazole 60 mg p.o. daily.  PHYSICAL EXAMINATION:  VITAL SIGNS:  The patient's heart rate is 75 with a blood pressure of 123/82, respiratory rate is 14 with an oxygen saturation of 98% on room air. GENERAL:  She is in no acute distress and is breathing comfortably with no use of accessory muscles. NECK:  Notable for a supple neck with no masses or lymphadenopathy.  Her JVP is not appreciably elevated. CARDIAC:  Normal S1 and S2 and a regular rate with no murmurs, rubs appreciated. LUNGS:  Clear to auscultation bilaterally with no crackles or wheezing present. ABDOMEN:  Soft, nontender, nondistended with positive bowel sounds and no abdominal bruits. EXTREMITIES:  Warm and well perfused.  No lower extremity edema.  Her pulses are 2+ in the DP location bilaterally. NEUROLOGIC:  She is alert and oriented x3 with no focal neurologic deficits detected.  LABORATORY EVALUATION:  Her white count is 11.8, hematocrit 37, platelets 318,000.  Sodium is low at 128, potassium 4.3, chloride 92, bicarb 25, BUN 13, creatinine 1.0 with a glucose of 126.  Initial set of cardiac biomarkers include a CK of 416 and CK-MB of 5.8, a troponin of 0.01.  Her electrocardiogram demonstrates normal sinus  rhythm with no ST changes or deviation and no Q-waves. A chest x-ray shows no focal infiltrates or effusions.  IMPRESSION:  This is an 77 year old woman with chest and back pain today suggesting the potential for myocardial ischemia versus esophageal spasm versus gastroesophageal reflux disease or some other gastrointestinal etiology.  Given her negative electrocardiogram and negative initial enzymes, we will plan to complete an enzyme trend without the need or use of intravenous heparin.  We will admit her to telemetry for serial enzymes and ECGs.  Also continue her home blood pressure medication and home gastroesophageal reflux disease medication.  If her ECGs  and enzymes remain negative, we would then consider alternative diagnosis such as esophageal spasm, particularly given its response to sublingual nitroglycerin.  Further reassurance regarding her coronary status could result from more elective noninvasive stress testing.  Her appetite has been poor over the last several days in the setting of some diarrhea.  Her poor p.o. intake together with her labs suggest that she might be somewhat volume depleted.  She will also receive gentle hydration in the form of intravenous boluses, and we will reevaluate her volume and gastrointestinal status accordingly.  The patient voiced understanding with the plan, and her questions were answered to the best of my ability.          ______________________________ Zacarias Pontes, MD     DM/MEDQ  D:  09/28/2010  T:  09/28/2010  Job:  [id number]  Electronically Signed by Zacarias Pontes MD on 09/28/2010 05:21:03 PM

## 2010-09-29 ENCOUNTER — Inpatient Hospital Stay (HOSPITAL_COMMUNITY): Payer: Medicare HMO

## 2010-09-29 DIAGNOSIS — R079 Chest pain, unspecified: Secondary | ICD-10-CM

## 2010-09-29 MED ORDER — TECHNETIUM TC 99M TETROFOSMIN IV KIT
30.0000 | PACK | Freq: Once | INTRAVENOUS | Status: AC | PRN
  Administered 2010-09-29: 30 via INTRAVENOUS

## 2010-09-29 MED ORDER — TECHNETIUM TC 99M TETROFOSMIN IV KIT
10.0000 | PACK | Freq: Once | INTRAVENOUS | Status: AC | PRN
  Administered 2010-09-29: 10 via INTRAVENOUS

## 2010-10-02 ENCOUNTER — Encounter: Payer: Self-pay | Admitting: Nurse Practitioner

## 2010-10-06 ENCOUNTER — Encounter: Payer: Self-pay | Admitting: *Deleted

## 2010-10-06 NOTE — Discharge Summary (Addendum)
[name], WILL NO.:  [id number]  MEDICAL RECORD NO.:  [id number]  LOCATION:  2034                         FACILITY:  [facility]  PHYSICIAN:  Vesta Mixer, M.D. DATE OF BIRTH:  1928/02/02  DATE OF ADMISSION:  09/27/2010 DATE OF DISCHARGE:  09/29/2010                              DISCHARGE SUMMARY   DISCHARGE DIAGNOSES: 1. Chest pain.     a.     Elevated CKs and MBs up to 416/5.8, but negative troponins      x5.     b.     Negative Lexiscan Myoview with no evidence of ischemia with      EF 56% by stress test on September 29, 2010. 2. Hyponatremia, for outpatient followup. 3. Hypertension. 4. Esophageal stricture status post dilatation in the past. 5. Gastroesophageal reflux disease. 6. Status post right hemicolectomy in the past. 7. Status post partial small-bowel obstruction subsequent to her right     hemicolectomy.  HOSPITAL COURSE:  [name] [name] is an 77 year old female with no prior history of coronary artery disease with an extensive GI history who presented with new onset chest pain.  She had not had any nausea, but had, had diarrhea and overall felt tired.  On day of admission, she felt substernal chest burning radiating to her back, which was slightly worse when lying flat.  The pain became more bothersome when she presented to the ER for further evaluation and management.  EKG demonstrated normal sinus rhythm with no ST changes or deviation and no Q-waves.  Initial enzymes were negative.  Her pain was felt somewhat atypical in etiology, consideration of alternative diagnosis such as esophageal spasm particularly given his response to sublingual nitroglycerin.  The patient was observed over the weekend in the hospital with some elevation in her CKs and MBs as above, but negative troponins.  She underwent Lexiscan nuclear stress testing today September 29, 2010, showing no evidence of ischemia.  EF of 56%.  She was noted to have hyponatremia  this admission with sodium 128, but this may have been related to her diarrhea and she had no mental status changes.  Dr. Elease Hashimoto would like to address this as an outpatient.  Dr. Elease Hashimoto has seen and examined the patient today and feels she is stable for discharge.  DISCHARGE LABS:  WBC 11.8, hemoglobin 12.8, hematocrit 37.7, platelet count 388.  Sodium 128, potassium 4.2, chloride 92, CO2 of 25, glucose 126, BUN 13, creatinine 1.03.  LFTs were normal with the exception of total bilirubin of 0.2.  A1c 6.  Total cholesterol 166, triglycerides 130, HDL 62, LDL 70.  STUDIES: 1. Chest x-ray September 27, 2010, showed minimal left basilar     atelectasis.  Lungs otherwise clear. 2. Nuclear stress test September 29, 2010, showed normal examination     without evidence of pharmacologically-induced myocardial ischemia.     EF is 56%.  DISCHARGE MEDICATIONS: 1. Acetaminophen 500 mg 1-2 tablets q.6 h. p.r.n. pain. 2. [place] Probiotic 1 tablet daily. 3. Dexilant 60 mg daily 30 minutes before breakfast. 4. Flonase 1 spray each nostril b.i.d. p.r.n. seasonal allergies. 5. Ramipril 10 mg b.i.d. 6. Vitamin D2 50,000 every Friday.  DISPOSITION:  [name] [name] will be discharged in stable condition to home.  She is to follow a low-sodium heart-healthy diet and keep her IV site clean and dry.  She will follow up with Dr. Elease Hashimoto as an outpatient and our office will call her with this appointment.  DURATION OF DISCHARGE ENCOUNTER:  Greater than 30 minutes including physician and PA time.     Ronie Spies, P.A.C.   ______________________________ Vesta Mixer, M.D.    DD/MEDQ  D:  09/29/2010  T:  09/30/2010  Job:  [id number]  Electronically Signed by Kristeen Miss M.D. on 10/06/2010 05:36:03 AM Electronically Signed by Ronie Spies  on 10/06/2010 07:48:57 AM

## 2010-10-09 ENCOUNTER — Encounter: Payer: Self-pay | Admitting: Gastroenterology

## 2010-10-09 ENCOUNTER — Ambulatory Visit (INDEPENDENT_AMBULATORY_CARE_PROVIDER_SITE_OTHER): Payer: Medicare HMO | Admitting: Gastroenterology

## 2010-10-09 ENCOUNTER — Other Ambulatory Visit (INDEPENDENT_AMBULATORY_CARE_PROVIDER_SITE_OTHER): Payer: Medicare HMO

## 2010-10-09 VITALS — BP 120/70 | HR 72 | Ht 65.0 in | Wt 162.6 lb

## 2010-10-09 DIAGNOSIS — K6389 Other specified diseases of intestine: Secondary | ICD-10-CM

## 2010-10-09 DIAGNOSIS — E538 Deficiency of other specified B group vitamins: Secondary | ICD-10-CM

## 2010-10-09 DIAGNOSIS — R1310 Dysphagia, unspecified: Secondary | ICD-10-CM | POA: Insufficient documentation

## 2010-10-09 DIAGNOSIS — K219 Gastro-esophageal reflux disease without esophagitis: Secondary | ICD-10-CM

## 2010-10-09 DIAGNOSIS — Z9049 Acquired absence of other specified parts of digestive tract: Secondary | ICD-10-CM

## 2010-10-09 DIAGNOSIS — R1314 Dysphagia, pharyngoesophageal phase: Secondary | ICD-10-CM

## 2010-10-09 DIAGNOSIS — I10 Essential (primary) hypertension: Secondary | ICD-10-CM

## 2010-10-09 DIAGNOSIS — Z79899 Other long term (current) drug therapy: Secondary | ICD-10-CM

## 2010-10-09 DIAGNOSIS — Z9889 Other specified postprocedural states: Secondary | ICD-10-CM

## 2010-10-09 LAB — CBC WITH DIFFERENTIAL/PLATELET
Eosinophils Relative: 1.1 % (ref 0.0–5.0)
HCT: 40.6 % (ref 36.0–46.0)
Hemoglobin: 13.2 g/dL (ref 12.0–15.0)
Lymphs Abs: 1 10*3/uL (ref 0.7–4.0)
Monocytes Relative: 5.8 % (ref 3.0–12.0)
Neutro Abs: 3.5 10*3/uL (ref 1.4–7.7)
RBC: 4.75 Mil/uL (ref 3.87–5.11)
WBC: 4.8 10*3/uL (ref 4.5–10.5)

## 2010-10-09 LAB — SEDIMENTATION RATE: Sed Rate: 29 mm/hr — ABNORMAL HIGH (ref 0–22)

## 2010-10-09 LAB — IBC PANEL
Iron: 87 ug/dL (ref 42–145)
Transferrin: 361.6 mg/dL — ABNORMAL HIGH (ref 212.0–360.0)

## 2010-10-09 LAB — BASIC METABOLIC PANEL
BUN: 10 mg/dL (ref 6–23)
CO2: 25 mEq/L (ref 19–32)
Calcium: 10 mg/dL (ref 8.4–10.5)
Chloride: 103 mEq/L (ref 96–112)
Creatinine, Ser: 0.9 mg/dL (ref 0.4–1.2)
GFR: 75.48 mL/min (ref >60.00)
Glucose, Bld: 93 mg/dL (ref 70–99)
Potassium: 5.2 mEq/L — ABNORMAL HIGH (ref 3.5–5.1)
Sodium: 137 mEq/L (ref 135–145)

## 2010-10-09 LAB — HEPATIC FUNCTION PANEL
ALT: 35 U/L (ref 0–35)
AST: 30 U/L (ref 0–37)
Albumin: 4.7 g/dL (ref 3.5–5.2)
Alkaline Phosphatase: 73 U/L (ref 39–117)
Bilirubin, Direct: 0.1 mg/dL (ref 0.0–0.3)
Total Bilirubin: 0.6 mg/dL (ref 0.3–1.2)
Total Protein: 8.5 g/dL — ABNORMAL HIGH (ref 6.0–8.3)

## 2010-10-09 LAB — LIPASE: Lipase: 43 U/L (ref 11.0–59.0)

## 2010-10-09 LAB — FERRITIN: Ferritin: 41.6 ng/mL (ref 10.0–291.0)

## 2010-10-09 LAB — TSH: TSH: 1.61 u[IU]/mL (ref 0.35–5.50)

## 2010-10-09 NOTE — Progress Notes (Signed)
This is a 77 year old African American female with recurrent bacterial overgrowth syndrome, chronic GERD, and associated B12 deficiencies. She complains of mild reflux symptoms but has had progressive solid food dysphagia over the last several weeks, and is currently on a clear liquid diet. She denies any specific hepatobiliary complaints. Other problems include periodic diarrhea, gas, bloating, and distention. She is on Dexilant 60 mg a day and Altace 10 mg a day. She denies abuse of alcohol, cigarettes, or NSAIDs. She has had several abdominal ultrasounds with no evidence of cholelithiasis. Previously she had partial colectomy because of a perforated viscus. This is been felt to be the source of her recurrent bacterial overgrowth syndrome. She has not had postsurgical problems with intestinal obstruction.  Current Medications, Allergies, Past Medical History, Past Surgical History, Family History and Social History were reviewed in [employer] record.  Pertinent Review of Systems Negative.. she denies current cardiovascular, pulmonary, genitourinary or general medical problems such as fever or chills. There is no past history of hepatitis or pancreatitis.   Physical Exam: Awake and alert in no distress appearing much younger than her stated age. Cannot appreciate stigmata of chronic liver disease. Chest is clear, and she is in a regular rhythm without murmurs gallops or rubs. There is no organomegaly, abdominal masses or distention. Bowel sounds are normal.  Extremities unremarkable. Mental status is normal.    Assessment and Plan: Current bacterial overgrowth related to previous right hemicolectomy. She has had excellent response the past to Xifaxan therapy which I have reinstituted 550 mg twice a day for 2 weeks with probiotic therapy. Because of her dysphagia we will repeat her endoscopy and dilatation. Renal laboratory parameters also repeated today were reviewed. Otherwise  she is to continue medications as listed and reviewed her record. Encounter Diagnosis  Name Primary?  . Esophageal reflux Yes

## 2010-10-09 NOTE — Patient Instructions (Signed)
Your procedure has been scheduled for 10/13/2010, please follow the seperate instructions.  Please go to the basement today for your labs.  Take Xifaxan one tablet by mouth twice a day for 10 days (rx sent to your pharmacy), also take Align (Samples given)  one tablet by mouth once a day while taking Xifaxan. Alternate drinking water and gateraid.

## 2010-10-10 ENCOUNTER — Ambulatory Visit: Payer: Medicare HMO | Admitting: Nurse Practitioner

## 2010-10-10 LAB — CELIAC PANEL 10
Tissue Transglut Ab: 27 U/mL — ABNORMAL HIGH (ref <20)
Tissue Transglutaminase Ab, IgA: 7.6 U/mL (ref <20)

## 2010-10-13 ENCOUNTER — Telehealth: Payer: Self-pay | Admitting: *Deleted

## 2010-10-13 ENCOUNTER — Ambulatory Visit: Payer: Medicare HMO | Admitting: Nurse Practitioner

## 2010-10-13 ENCOUNTER — Encounter: Payer: Self-pay | Admitting: Gastroenterology

## 2010-10-13 ENCOUNTER — Ambulatory Visit (AMBULATORY_SURGERY_CENTER): Payer: Medicare HMO | Admitting: Gastroenterology

## 2010-10-13 ENCOUNTER — Encounter: Payer: Self-pay | Admitting: *Deleted

## 2010-10-13 DIAGNOSIS — D1330 Benign neoplasm of unspecified part of small intestine: Secondary | ICD-10-CM

## 2010-10-13 DIAGNOSIS — D130 Benign neoplasm of esophagus: Secondary | ICD-10-CM

## 2010-10-13 DIAGNOSIS — R634 Abnormal weight loss: Secondary | ICD-10-CM

## 2010-10-13 DIAGNOSIS — K900 Celiac disease: Secondary | ICD-10-CM

## 2010-10-13 DIAGNOSIS — R1310 Dysphagia, unspecified: Secondary | ICD-10-CM

## 2010-10-13 DIAGNOSIS — K219 Gastro-esophageal reflux disease without esophagitis: Secondary | ICD-10-CM

## 2010-10-13 DIAGNOSIS — R1314 Dysphagia, pharyngoesophageal phase: Secondary | ICD-10-CM

## 2010-10-13 DIAGNOSIS — K6389 Other specified diseases of intestine: Secondary | ICD-10-CM

## 2010-10-13 MED ORDER — SODIUM CHLORIDE 0.9 % IV SOLN: 500.0000 mL | INTRAVENOUS | Status: DC

## 2010-10-13 NOTE — Telephone Encounter (Signed)
Spoke with [name] [name] to inform her Dr Jarold Motto would like for you to begin a Gluten Free diet and f/u with him in 1 month. [name] [name] stated understanding; she has a procedure here today in the [facility], so I took info on diet and appt to [facility].

## 2010-10-13 NOTE — Telephone Encounter (Signed)
Message copied by Florene Glen on Mon Oct 13, 2010 10:51 AM ------      Message from: Jarold Motto, DAVID R      Created: Fri Oct 10, 2010  4:54 PM       PLACE ON GLUTEN FREE DIET  SEE ME 1 MONTH

## 2010-10-13 NOTE — Patient Instructions (Signed)
Follow your instructions for your diet for the remainder of the day. Nothing to eat or drink until 3 pm. Clear liquids only from 3 pm to 4 pm today. Soft diet from 4 pm through the remainder of the day.  New prescription for Xifaxan twice daily.Samples given.  Resume your other medications.  Await pathology results.

## 2010-10-14 ENCOUNTER — Telehealth: Payer: Self-pay | Admitting: *Deleted

## 2010-10-14 NOTE — Telephone Encounter (Signed)

## 2010-10-20 ENCOUNTER — Encounter: Payer: Self-pay | Admitting: Nurse Practitioner

## 2010-10-20 ENCOUNTER — Ambulatory Visit (INDEPENDENT_AMBULATORY_CARE_PROVIDER_SITE_OTHER): Payer: Medicare HMO | Admitting: Nurse Practitioner

## 2010-10-20 ENCOUNTER — Encounter: Payer: Self-pay | Admitting: Gastroenterology

## 2010-10-20 DIAGNOSIS — R079 Chest pain, unspecified: Secondary | ICD-10-CM | POA: Insufficient documentation

## 2010-10-20 NOTE — Assessment & Plan Note (Signed)
She has had no recurrence of symptoms. Her stress test was ok. She will continue with her GI follow up. She will let us know if she has any more problems. We will see her back prn and otherwise defer her management back to primary care. Patient is agreeable to this plan and will call if any problems develop in the interim.

## 2010-10-20 NOTE — Progress Notes (Signed)
[name] [name] Date of Birth: 1928-12-28   History of Present Illness: [name] [name] is seen today for a follow up visit. She is seen for Dr. Elease Hashimoto. She was in the hospital earlier this month with chest pain. She had a negative stress test. Her symptoms were felt to be more GI related. She is seeing Dr. Jarold Motto for GI follow up. She did have some hyponatremia which has already been rechecked by her PCP. She has had no further symptoms and feels well. Blood pressure at home is good. She is tolerating her medicines.   Current Outpatient Prescriptions on File Prior to Visit  Medication Sig Dispense Refill  . acetaminophen (TYLENOL) 500 MG tablet Take 500 mg by mouth every 6 (six) hours as needed.        . ALPRAZolam (XANAX) 0.25 MG tablet Take 0.25 mg by mouth at bedtime as needed.        Marland Kitchen DEXILANT 60 MG capsule TAKE 1 CAPSULE DAILY 30 MIN PRIOR TO BREAKFAST  30 capsule  3  . fexofenadine (ALLEGRA) 180 MG tablet Take 180 mg by mouth as needed.        . fish oil-omega-3 fatty acids 1000 MG capsule Take 3 g by mouth daily.        Marland Kitchen loratadine (CLARITIN) 10 MG tablet Take 10 mg by mouth as needed.        . Probiotic Product (ALIGN) 4 MG CAPS Take by mouth daily.        . ramipril (ALTACE) 10 MG tablet Take 10 mg by mouth 2 (two) times daily.        . vitamin C (ASCORBIC ACID) 500 MG tablet Take 500 mg by mouth daily.        . Vitamin D, Ergocalciferol, (DRISDOL) 50000 UNITS CAPS Take 50,000 Units by mouth every 7 (seven) days.        Marland Kitchen DISCONTD: cyanocobalamin 1000 MCG tablet Take 100 mcg by mouth as needed.          No Known Allergies  Past Medical History  Diagnosis Date  . Chest heaviness Sept 8th, 2012    negative stress test in the hospital Sept 2012  . Hypertension   . GERD (gastroesophageal reflux disease)   . MVP (mitral valve prolapse)   . PVC's (premature ventricular contractions)   . Hiatal hernia   . Anemia   . Fatty liver   . Blind loop syndrome   . Loss of weight     . Schatzki's ring   . Abscess of intestine   . Vitamin B12 deficiency   . 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 Allergy     SEASONAL  . Anxiety   . Arthritis     KNEES/HANDS  . Cataract     BILATERAL    Past Surgical History  Procedure Date  . Hemicolectomy     and ileectomy Dr Rolene Course  . Rotator cuff repair   . Nuclear stress test Sept 2012    Normal    History  Smoking status  . Never Smoker   Smokeless tobacco  . Never Used    History  Alcohol Use No    Family History  Problem Relation Age of Onset  . Hypertension Sister   . Diabetes Sister   . Diabetes Brother   . Coronary artery disease Brother   . Deep vein thrombosis Sister     Review of Systems: The review of systems is positive for  GERD. Blood pressure at home is good.  All other systems were reviewed and are negative.  Physical Exam: BP 142/82  Pulse 72  Ht 5\' 5"  (1.651 m)  Wt 164 lb 12.8 oz (74.753 kg)  BMI 27.42 kg/m2 Patient is very pleasant and in no acute distress. Skin is warm and dry. Color is normal.  HEENT is unremarkable. Normocephalic/atraumatic. PERRL. Sclera are nonicteric. Neck is supple. No masses. No JVD. Lungs are clear. Cardiac exam shows a regular rate and rhythm. Abdomen is soft. Extremities are without edema. Gait and ROM are intact. No gross neurologic deficits noted.   LABORATORY DATA:  N/A   Assessment / Plan:

## 2010-10-20 NOTE — Patient Instructions (Signed)
I think you are doing well. Keep a check on your blood pressure at home. Let us know if it stays above 135/85  We will see you back as needed. If you have any more chest pain, let us know or go back to the ER.  Call for any problems.

## 2010-10-28 ENCOUNTER — Other Ambulatory Visit: Payer: Self-pay | Admitting: Orthopaedic Surgery

## 2010-10-28 ENCOUNTER — Encounter (HOSPITAL_COMMUNITY): Payer: Medicare HMO

## 2010-10-28 LAB — URINE MICROSCOPIC-ADD ON

## 2010-10-28 LAB — BASIC METABOLIC PANEL
Calcium: 9.4 mg/dL (ref 8.4–10.5)
GFR calc Af Amer: 74 mL/min — ABNORMAL LOW (ref >90)
GFR calc non Af Amer: 64 mL/min — ABNORMAL LOW (ref >90)
Glucose, Bld: 82 mg/dL (ref 70–99)
Sodium: 139 mEq/L (ref 135–145)

## 2010-10-28 LAB — URINALYSIS, ROUTINE W REFLEX MICROSCOPIC
Bilirubin Urine: NEGATIVE
Glucose, UA: NEGATIVE mg/dL
Ketones, ur: NEGATIVE mg/dL
pH: 7 (ref 5.0–8.0)

## 2010-10-28 LAB — CBC
MCH: 26.6 pg (ref 26.0–34.0)
MCV: 85.5 fL (ref 78.0–100.0)
Platelets: 233 10*3/uL (ref 150–400)
RDW: 15.2 % (ref 11.5–15.5)

## 2010-10-28 LAB — SURGICAL PCR SCREEN
MRSA, PCR: NEGATIVE
Staphylococcus aureus: NEGATIVE

## 2010-10-31 ENCOUNTER — Inpatient Hospital Stay (HOSPITAL_COMMUNITY): Payer: Medicare HMO

## 2010-10-31 ENCOUNTER — Inpatient Hospital Stay (HOSPITAL_COMMUNITY)
Admission: RE | Admit: 2010-10-31 | Discharge: 2010-11-05 | DRG: 470 | Disposition: A | Discharge status: Skilled Nursing Facility | Payer: Medicare HMO | Source: Ambulatory Visit | Attending: Orthopaedic Surgery | Admitting: Orthopaedic Surgery

## 2010-10-31 DIAGNOSIS — D62 Acute posthemorrhagic anemia: Secondary | ICD-10-CM | POA: Diagnosis not present

## 2010-10-31 DIAGNOSIS — K219 Gastro-esophageal reflux disease without esophagitis: Secondary | ICD-10-CM | POA: Diagnosis present

## 2010-10-31 DIAGNOSIS — I10 Essential (primary) hypertension: Secondary | ICD-10-CM | POA: Diagnosis present

## 2010-10-31 DIAGNOSIS — F411 Generalized anxiety disorder: Secondary | ICD-10-CM | POA: Diagnosis present

## 2010-10-31 DIAGNOSIS — M1710 Unilateral primary osteoarthritis, unspecified knee: Principal | ICD-10-CM | POA: Diagnosis present

## 2010-10-31 DIAGNOSIS — Z01812 Encounter for preprocedural laboratory examination: Secondary | ICD-10-CM

## 2010-11-01 LAB — CBC
HCT: 30.5 % — ABNORMAL LOW (ref 36.0–46.0)
MCH: 26.8 pg (ref 26.0–34.0)
MCV: 85.2 fL (ref 78.0–100.0)
RDW: 15.3 % (ref 11.5–15.5)
WBC: 7.4 10*3/uL (ref 4.0–10.5)

## 2010-11-01 LAB — BASIC METABOLIC PANEL
BUN: 5 mg/dL — ABNORMAL LOW (ref 6–23)
CO2: 25 mEq/L (ref 19–32)
Calcium: 8.7 mg/dL (ref 8.4–10.5)
Creatinine, Ser: 0.78 mg/dL (ref 0.50–1.10)
Glucose, Bld: 129 mg/dL — ABNORMAL HIGH (ref 70–99)

## 2010-11-02 LAB — BASIC METABOLIC PANEL WITH GFR
BUN: 6 mg/dL (ref 6–23)
CO2: 25 meq/L (ref 19–32)
Calcium: 8.9 mg/dL (ref 8.4–10.5)
Chloride: 103 meq/L (ref 96–112)
Creatinine, Ser: 0.71 mg/dL (ref 0.50–1.10)
GFR calc Af Amer: >90 mL/min
GFR calc non Af Amer: 79 mL/min — ABNORMAL LOW
Glucose, Bld: 128 mg/dL — ABNORMAL HIGH (ref 70–99)
Potassium: 3.9 meq/L (ref 3.5–5.1)
Sodium: 136 meq/L (ref 135–145)

## 2010-11-02 LAB — CBC
HCT: 29.7 % — ABNORMAL LOW (ref 36.0–46.0)
Hemoglobin: 9.6 g/dL — ABNORMAL LOW (ref 12.0–15.0)
MCH: 27.4 pg (ref 26.0–34.0)
MCHC: 32.3 g/dL (ref 30.0–36.0)
MCV: 84.6 fL (ref 78.0–100.0)
Platelets: 181 K/uL (ref 150–400)
RBC: 3.51 MIL/uL — ABNORMAL LOW (ref 3.87–5.11)
RDW: 15.6 % — ABNORMAL HIGH (ref 11.5–15.5)
WBC: 9.5 K/uL (ref 4.0–10.5)

## 2010-11-03 LAB — CBC
MCH: 27.6 pg (ref 26.0–34.0)
Platelets: 175 10*3/uL (ref 150–400)
RBC: 3.19 MIL/uL — ABNORMAL LOW (ref 3.87–5.11)
RDW: 15.2 % (ref 11.5–15.5)

## 2010-11-03 LAB — BASIC METABOLIC PANEL
Calcium: 8.7 mg/dL (ref 8.4–10.5)
Creatinine, Ser: 0.7 mg/dL (ref 0.50–1.10)
GFR calc non Af Amer: 80 mL/min — ABNORMAL LOW (ref >90)
Glucose, Bld: 112 mg/dL — ABNORMAL HIGH (ref 70–99)
Sodium: 137 mEq/L (ref 135–145)

## 2010-11-04 NOTE — Discharge Summary (Signed)
  NAMEBRITANEE, [name] [name]:  [id number]  MEDICAL RECORD NO.:  [id number]  LOCATION:  1609                         FACILITY:  [facility]  PHYSICIAN:  Vanita Panda. Magnus Ivan, M.D.DATE OF BIRTH:  [birth year]  DATE OF ADMISSION:  10/31/2010 DATE OF DISCHARGE:  11/04/2010                              DISCHARGE SUMMARY   ADMITTING DIAGNOSES:  Severe osteoarthritis and degenerative joint disease, right knee.  SECONDARY DIAGNOSES: 1. Hypertension. 2. Gastroesophageal reflux disease. 3. Anemia. 4. Anxiety. 5. Bilateral cataract surgery.  DISCHARGE DIAGNOSIS:  Status post, right total knee arthroplasty on October 31, 2010.  PROCEDURE:  Right total knee arthroplasty on October 31, 2010.  HOSPITAL COURSE:  [name] is a very pleasant 77 year old female, well-known to me.  She has debilitating arthritis of both the knees with the right, worse than the left.  She had multiple injections in both these knees and has now got to the point where it affects her activities of daily living.  She tried to ambulate with the cane.  She does not want injections anymore because they are not working and she wished to proceed with total knee arthroplasty on her right knee.  I have talked to her about this in length and have this affects her activities of daily living.  She would like to have the surgery.  She was taken to the operating room on the day of admission where she underwent a right total knee arthroplasty without difficulty.  She was then admitted to the orthopedic floor and had an uneventful hospital stay.  It was felt that she would benefit from skilled nursing facility short-term somewhat secondary to her need for further therapy as well as the fact that the person at home with her is her husband who has mild dementia.  By the day of discharge, her incision was clean, dry, and intact.  She was afebrile with stable vital signs and it is felt that she will be discharged  safely to skilled nursing facility.  DISPOSITION:  Discharged to short-term skilled nursing facility.  DISCHARGE INSTRUCTIONS:  While she is at the skilled nursing facility, she can get her incision wet daily.  A dry dressing should be placed daily on the right knee.  She can weightbear as tolerated with assistance and therapy can work on range of motion of her right knee as well as some subtle strengthening.  Followup with home health should be established in my office at [facility] and 2 weeks after discharge, area code [phone].  DISCHARGE MEDICATIONS: 1. Percocet 5/325 1 to 2 p.o. q.6 hours p.r.n. pain. 2. Xarelto 10 mg p.o. daily x1 week. 3. Robaxin 500 mg one p.o. q.6 hours p.r.n. spasms. 4. Vitamin B12 injection q.monthly. 5. Calcium carbonate/vitamin D 2 tablets p.o. q.a.m. 6. Vitamin D2 50,000 units p.o. q.weekly. 7. Loratadine 10 mg p.o. q.a.m. 8. Ramipril 10 mg p.o. b.i.d. 9. Dexilant one tablet p.o. q.a.m. 10.Probiotic one tablet p.o. daily. 11.Xanax 0.25 mg daily p.r.n.     Vanita Panda. Magnus Ivan, M.D.     CYB/MEDQ  D:  11/03/2010  T:  11/03/2010  Job:  [id number]  Electronically Signed by Doneen Poisson M.D. on 11/04/2010 09:11:21 PM

## 2010-11-04 NOTE — Op Note (Signed)
[name] [name], [name] NO.:  [id number]  MEDICAL RECORD NO.:  [id number]  LOCATION:  1609                         FACILITY:  [facility]  PHYSICIAN:  Vanita Panda. Magnus Ivan, M.D.DATE OF BIRTH:  17-Apr-1928  DATE OF PROCEDURE:  10/31/2010 DATE OF DISCHARGE:                              OPERATIVE REPORT   PREOPERATIVE DIAGNOSES:  Severe osteoarthritis and degenerative joint disease, right knee.  POSTOPERATIVE DIAGNOSES:  Severe osteoarthritis and degenerative joint disease, right knee.  PROCEDURE:  Right total hip arthroplasty utilizing computer navigation as assistance.  IMPLANTS:  DePuy PFC Sigma Rotating Platform knee with size 4 narrow femur, size 3 tibial tray, 15 mm polyethylene insert, 35 mm patella button.  SURGEON:  Vanita Panda. Magnus Ivan, MD.  ANESTHESIA: 1. Right femoral nerve block. 2. Spinal with MAC inhalation and IV sedation.  ANTIBIOTICS:  1 g IV Ancef.  BLOOD LOSS:  50 to 75 cc.  TOURNIQUET TIME:  83 minutes.  COMPLICATIONS:  None.  INDICATIONS:  [name] [name] [name] is an 77 year old with end-stage arthritis of her right knee.  She has failed conservative treatment with multiple injections ambulating with an assistive device and has greatly affected her activities of daily living.  X-rays show end-stage arthritis of her knee as well.  She wishes to proceed with a total knee arthroplasty. Risks and benefits of surgery were explained to her in detail including the risk of DVT, PE, acute blood loss anemia, nerve and vessel injury in depth.  The benefits include improvement in her quality of life and decreased pain and increased mobility.  PROCEDURE DESCRIPTION:  After informed consent was obtained, the appropriate right knee was marked.  She was brought to the operating room, placed supine on the operating room table, a femoral nerve block was already placed and then she was set up and the spinal anesthesia was obtained as well.  She was laid  back in the supine position and Foley catheter was placed.  Tourniquet was placed around her upper right thigh and her leg was prepped and draped with DuraPrep and sterile drapes including sterile stockinette. A time-out was called and she was identified as the correct patient and correct right knee.  I then used Esmarch to wrap out her leg and tourniquet was insufflated to 300 mm of pressure.  A midline incision was then made after testing her for adequate anesthesia.  We carried this proximally and distally and dissected down to the joint and medial parapatellar arthrotomy was then carried out.  There was a large effusion encountered in her knee and significant cartilage changes throughout the knee with complete loss of cartilage in all 3 compartments.  I then cleaned the knee of osteophytes and other debris and proceeded with computer navigation portion of the case.  Two small Steinmann pins were placed in the tibia in the anterior, medial, posterior, lateral direction.  Likewise 2 pins were placed in the femur.  Small globes were placed over this and we could generate a computer model of the knee.  Based of this computer model we were able to make distal tibia cut and then a distal femoral cut.  We then finally made our chamfer cuts after setting a rotation guide.  We made our femoral box cut and then back to the tibia, drilled for tibia post and punch for a keel and all this was done based on computer navigation plan and our cuts.  We placed the trial sized 4 narrow femur followed by the 3 tibia.  All in the cortices have our navigation plan of the knee was completely balanced and flexion and extension with a 15 mm polyethylene insert there was no instability in varus and valgus stressing and her drawer sign was negative.  We then cut for patellar button and drilled locks for this as well.  We then removed all trial components.  We copiously irrigated the knee with normal saline  solution using pulsatile lavage.  We then cemented the real size 3 tibial tray and the real 4 nerve femur.  We cleaned the knee of cement debris and then placed the real 15 mm polyethylene inserts and cemented the 35 patella button.  Once the cement had hardened, hemostatics was obtained with electrocautery.  Once the tourniquet was let down, we then copiously irrigated the knee with 3 L normal saline solution using pulsatile lavage.  Next, a medium Hemovac drain was placed.  I closed the arthrotomy with interrupted #1 Vicryl suture followed by 2-0 Vicryl in subcutaneous tissue and ended up with staples on the skin.  Xeroform followed  by a well-padded sterile dressings were applied.  The patient was awakened, extubated, and taken to recovery room in stable condition. Postoperatively, she will be admitted as an inpatient and will be treated according to protocol with PT/OT and nursing care.     Vanita Panda. Magnus Ivan, M.D.     CYB/MEDQ  D:  10/31/2010  T:  11/01/2010  Job:  [id number]  Electronically Signed by Doneen Poisson M.D. on 11/04/2010 09:11:18 PM

## 2010-11-04 NOTE — H&P (Signed)
  NAMELAURIS, KEEPERS NO.:  [id number]  MEDICAL RECORD NO.:  [id number]  LOCATION:  0009                         FACILITY:  [facility]  PHYSICIAN:  Vanita Panda. Magnus Ivan, M.D.DATE OF BIRTH:  1928/06/13  DATE OF ADMISSION:  10/31/2010 DATE OF DISCHARGE:                             HISTORY & PHYSICAL   CHIEF COMPLAINT:  Severe right knee pain.  HISTORY OF PRESENT ILLNESS:  Ms. Tegethoff is an 77 year old female whom I have not seen for some time now, and she has severe arthritis on both her knees.  She has had multiple injections on these knees and as to the point where it is affecting her activities of daily living.  She has tried to ambulate with cane.  She does not want injections anymore at this point because __________ wishes to proceed now with total knee replacement on her right knee.  I have talked her about this in length __________ affects her activities of daily living.  She would like to have this done.  I have explained to her the risks and benefits of the surgery in detail including the risks of acute blood loss anemia, DVT, PE, nerve or vessel injury, and even death.  I have explained to her that my goal of surgery is hopefully a successful surgery with decreased pain and increased mobility and increased quality of life.  PAST MEDICAL HISTORY: 1. High blood pressure. 2. Acid reflux. 3. Anxiety. 4. Arthritis.  MEDICATIONS:  Altace, vitamin C, vitamin D, and Dexilant.  ALLERGIES:  No known drug allergies.  PREVIOUS SURGERIES:  Carpal tunnel release in the right hand, and rotator cuff surgery in the right shoulder.  FAMILY MEDICAL HISTORY:  Diabetes, heart disease, cancer, and high blood pressure.  SOCIAL HISTORY:  She is married.  She is retired.  She does not smoke and does not drink.  REVIEW OF SYSTEMS:  Negative for chest pain, shortness of breath, fever, chills, nausea, or vomiting.  PHYSICAL EXAMINATION:  VITAL SIGNS:  She is afebrile  with stable vital signs.  On her chart, we can see her vital signs, her laboratory exams, and chest x-ray. GENERAL:  She is alert and oriented x3, in no acute distress. HEENT:  Normocephalic and atraumatic.  Pupils are equal, round, and reactive to light.  Extraocular muscles intact. NECK:  Supple. LUNGS:  Clear to auscultation bilaterally. HEART:  Regular rate and rhythm. ABDOMEN:  Benign. EXTREMITIES:  Left knee shows warmth and a mild effusion with painful medial and lateral joint lines.  Painful range of motion with patellofemoral crepitations.  X-rays show tricompartmental arthritis of her right knee.  IMPRESSION:  This is an 77 year old female with debilitating painful arthritis of the right knee.  PLAN:  We will proceed today with a total knee arthroplasty.  Again, she understands the risks and benefits of the surgery, and we are proceeding.     Vanita Panda. Magnus Ivan, M.D.     CYB/MEDQ  D:  10/31/2010  T:  10/31/2010  Job:  [id number]  Electronically Signed by Doneen Poisson M.D. on 11/04/2010 09:11:16 PM

## 2010-11-18 ENCOUNTER — Ambulatory Visit: Payer: Medicare HMO | Admitting: Gastroenterology

## 2010-12-01 NOTE — Op Note (Signed)
[name] [name], [name] [name]:  [id number]  MEDICAL RECORD NO.:  [id number]  LOCATION:  1609                         FACILITY:  [facility]  PHYSICIAN:  Vanita Panda. Magnus Ivan, M.D.DATE OF BIRTH:  1928-02-27  DATE OF PROCEDURE:  10/31/2010 DATE OF DISCHARGE:  11/05/2010                              OPERATIVE REPORT   PREOPERATIVE DIAGNOSIS:  Severe osteoarthritis and degenerative joint disease, right knee.  POSTOPERATIVE DIAGNOSIS:  Severe osteoarthritis and degenerative joint disease, right knee.  PROCEDURE:  Right total knee arthroplasty utilizing computer navigation as assistance.  IMPLANTS:  DePuy PFC Sigma platform knee with size 4 narrow femur, size 3 tibial tray, 15 mm polyethylene insert, 35 mm patellar button.  SURGEON:  Vanita Panda. Magnus Ivan, M.D.  ANESTHESIA: 1. Right femoral nerve block. 2. Spinal with mask ventilation and IV sedation.  ANTIBIOTICS:  1 g IV Ancef.  BLOOD LOSS:  50 cc to 75 cc.  TOURNIQUET TIME:  83 minutes.  COMPLICATIONS:  None.  INDICATIONS:  [name] [name] is an 77 year old female with end-stage arthritis of her right knee.  She has failed conservative treatment with multiple injections, ambulate with an assistive device, and is greatly affected her activities of daily living.  X-rays show end-stage arthritis of her right knee.  She wished to proceed with a total knee arthroplasty.  The risks and benefits of surgery were explained to her in detail including the risks of DVT, PE, acute blood loss anemia, nerve and vessel injury, and even death.  The benefits include improvement in her quality of life and decreased pain and increased mobility.  PROCEDURE DESCRIPTION:  After informed consent was obtained, the appropriate right knee was marked, she was brought to the operating room, placed supine on the operating table.  Femoral nerve block was already placed in the holding room and then she was set up and then spinal  anesthesia was obtained as well.  She was laid back in the supine position and Foley catheter was placed.  A tourniquet was placed around her upper right thigh and her leg was prepped and draped with DuraPrep and sterile drapes including a sterile stockinette.  A time-out was called, in which we identified the correct patient and correct right knee.  I then used an Esmarch to wrap out her leg and the tourniquet was inflated to 300 mm of pressure.  A midline incision was then made after testing her for adequate anesthesia.  We carried this proximally and distally and dissected down the knee joint and a medial parapatellar arthrotomy was then carried out.  There was a large effusion encountered in her knee and significant cartilage changes throughout the knee consistent with complete loss of the cartilage in all 3 compartments.  I then cleaned the knee of osteophytes and other debris and proceeded with navigation portion of the case.  Two small Steinmann pins were placed in the tibia in the anteromedial and posterolateral directions, likewise 2 pins were placed in the femur.  Small globes were placed over this pins and we did use a computer generated model of the knee.  Based on this computer model, we were able to make a distal tibia cut and then a distal femoral  cut.  We then finally made the chamfer cuts after setting at the rotation guide.  We made a femoral box cut and then back to the tibia, drilled for a tibial post and punched for a keel and all this was done based on the computer navigation plan with the cuts.  We placed the trial size 4 narrow femur, followed by the 4 tibia.  In all planes, navigation of the knee was completely balanced in flexion and extension with a 15 mm polyethylene insert.  There was no instability of varus or valgus dressing and her drawer sign was negative.  We then cut the patella button and drilled pegs for this as well.  We then removed all trial  components.  We copiously irrigated the knee with normal saline solution using pulsatile lavage.  We then cemented the real size 3 tibial tray, followed by the real size 4 narrow femur.  We cleaned the knee of cement debris and then placed the real 15 mm polyethylene insert and cemented the 35 mm patellar button.  Once the cement had hardened, hemostasis was obtained using electrocautery.  Once the tourniquet was let down , we then copiously irrigated the knee with another 3 L normal saline solution using pulsatile lavage.  Next, a medium Hemovac drain was placed.  I closed the arthrotomy with interrupted #1 Vicryl suture, followed by a 2-0 Vicryl in the subcutaneous tissue, and staples on the skin.  Xeroform followed by a well-padded sterile dressing was applied. The patient was awakened, then taken to recovery room in stable condition.  Postoperatively, she will be admitted as an inpatient, will be treated accordingly protocol with physical therapy, occupational therapy, and nursing care.     Vanita Panda. Magnus Ivan, M.D.     CYB/MEDQ  D:  12/01/2010  T:  12/01/2010  Job:  [id number]

## 2010-12-24 ENCOUNTER — Ambulatory Visit: Payer: Medicare HMO | Attending: Orthopaedic Surgery | Admitting: Physical Therapy

## 2010-12-24 DIAGNOSIS — IMO0001 Reserved for inherently not codable concepts without codable children: Secondary | ICD-10-CM | POA: Insufficient documentation

## 2010-12-24 DIAGNOSIS — M25569 Pain in unspecified knee: Secondary | ICD-10-CM | POA: Insufficient documentation

## 2010-12-24 DIAGNOSIS — M6281 Muscle weakness (generalized): Secondary | ICD-10-CM | POA: Insufficient documentation

## 2010-12-24 DIAGNOSIS — M25669 Stiffness of unspecified knee, not elsewhere classified: Secondary | ICD-10-CM | POA: Insufficient documentation

## 2010-12-24 DIAGNOSIS — R262 Difficulty in walking, not elsewhere classified: Secondary | ICD-10-CM | POA: Insufficient documentation

## 2010-12-25 ENCOUNTER — Ambulatory Visit: Payer: Medicare HMO | Admitting: Physical Therapy

## 2010-12-30 ENCOUNTER — Ambulatory Visit: Payer: Medicare HMO | Admitting: Rehabilitation

## 2011-01-01 ENCOUNTER — Ambulatory Visit: Payer: Medicare HMO | Admitting: Rehabilitation

## 2011-01-06 ENCOUNTER — Ambulatory Visit: Payer: Medicare HMO | Admitting: Physical Therapy

## 2011-01-07 ENCOUNTER — Ambulatory Visit: Payer: Medicare HMO | Admitting: Physical Therapy

## 2011-01-19 ENCOUNTER — Other Ambulatory Visit (HOSPITAL_COMMUNITY): Payer: Self-pay | Admitting: Orthopaedic Surgery

## 2011-01-19 ENCOUNTER — Encounter (HOSPITAL_COMMUNITY): Payer: Self-pay | Admitting: Pharmacy Technician

## 2011-01-21 ENCOUNTER — Encounter (HOSPITAL_COMMUNITY): Payer: Self-pay

## 2011-01-21 ENCOUNTER — Ambulatory Visit (HOSPITAL_COMMUNITY)
Admission: RE | Admit: 2011-01-21 | Discharge: 2011-01-21 | Disposition: A | Discharge status: Home / Self Care | Payer: Medicare Other | Source: Ambulatory Visit | Attending: Anesthesiology | Admitting: Anesthesiology

## 2011-01-21 ENCOUNTER — Encounter (HOSPITAL_COMMUNITY)
Admission: RE | Admit: 2011-01-21 | Discharge: 2011-01-21 | Disposition: A | Discharge status: Home / Self Care | Payer: Medicare Other | Source: Ambulatory Visit | Attending: Orthopaedic Surgery | Admitting: Orthopaedic Surgery

## 2011-01-21 LAB — CBC
MCV: 81 fL (ref 78.0–100.0)
Platelets: 243 10*3/uL (ref 150–400)
RBC: 4.27 MIL/uL (ref 3.87–5.11)
RDW: 17 % — ABNORMAL HIGH (ref 11.5–15.5)
WBC: 4.1 10*3/uL (ref 4.0–10.5)

## 2011-01-21 LAB — BASIC METABOLIC PANEL
CO2: 26 mEq/L (ref 19–32)
Calcium: 9.5 mg/dL (ref 8.4–10.5)
GFR calc Af Amer: 69 mL/min — ABNORMAL LOW (ref >90)
GFR calc non Af Amer: 60 mL/min — ABNORMAL LOW (ref >90)
Sodium: 136 mEq/L (ref 135–145)

## 2011-01-21 LAB — SURGICAL PCR SCREEN
MRSA, PCR: NEGATIVE
Staphylococcus aureus: NEGATIVE

## 2011-01-21 NOTE — Pre-Procedure Instructions (Signed)
20 [name] [name]  01/21/2011   Your procedure is scheduled on:  Thursday, January 3  Report to [facility] at 1:00 PM.  Call this number if you have problems the morning of surgery: [phone]   Remember:   Do not eat food:After Midnight.  May have clear liquids: up to 4 Hours before arrival.  Clear liquids include soda, tea, black coffee, apple or grape juice, broth.  Take these medicines the morning of surgery with A SIP OF WATER: Xanax if needed, Dexilant, allegra, claritin, percocet, Align   Do not wear jewelry, make-up or nail polish.  Do not wear lotions, powders, or perfumes. You may wear deodorant.  Do not shave 48 hours prior to surgery.  Do not bring valuables to the hospital.  Contacts, dentures or bridgework may not be worn into surgery.  Leave suitcase in the car. After surgery it may be brought to your room.  For patients admitted to the hospital, checkout time is 11:00 AM the day of discharge.   Patients discharged the day of surgery will not be allowed to drive home.  Name and phone number of your driver: [name] [phone]   Special Instructions: CHG Shower Use Special Wash: 1/2 bottle night before surgery and 1/2 bottle morning of surgery.   Please read over the following fact sheets that you were given: Pain Booklet, Coughing and Deep Breathing and Surgical Site Infection Prevention

## 2011-01-22 ENCOUNTER — Encounter (HOSPITAL_COMMUNITY): Payer: Self-pay | Admitting: Certified Registered"

## 2011-01-22 ENCOUNTER — Ambulatory Visit (HOSPITAL_COMMUNITY)
Admission: RE | Admit: 2011-01-22 | Discharge: 2011-01-22 | Disposition: A | Discharge status: Home / Self Care | Payer: Medicare Other | Source: Ambulatory Visit | Attending: Orthopaedic Surgery | Admitting: Orthopaedic Surgery

## 2011-01-22 ENCOUNTER — Ambulatory Visit (HOSPITAL_COMMUNITY): Payer: Medicare Other | Admitting: Certified Registered"

## 2011-01-22 ENCOUNTER — Encounter (HOSPITAL_COMMUNITY)
Admission: RE | Disposition: A | Discharge status: Home / Self Care | Payer: Self-pay | Source: Ambulatory Visit | Attending: Orthopaedic Surgery

## 2011-01-22 DIAGNOSIS — I10 Essential (primary) hypertension: Secondary | ICD-10-CM | POA: Insufficient documentation

## 2011-01-22 DIAGNOSIS — I499 Cardiac arrhythmia, unspecified: Secondary | ICD-10-CM | POA: Insufficient documentation

## 2011-01-22 DIAGNOSIS — Z96659 Presence of unspecified artificial knee joint: Secondary | ICD-10-CM | POA: Insufficient documentation

## 2011-01-22 DIAGNOSIS — Z01818 Encounter for other preprocedural examination: Secondary | ICD-10-CM | POA: Insufficient documentation

## 2011-01-22 DIAGNOSIS — K219 Gastro-esophageal reflux disease without esophagitis: Secondary | ICD-10-CM | POA: Insufficient documentation

## 2011-01-22 DIAGNOSIS — M24669 Ankylosis, unspecified knee: Secondary | ICD-10-CM

## 2011-01-22 DIAGNOSIS — K449 Diaphragmatic hernia without obstruction or gangrene: Secondary | ICD-10-CM | POA: Insufficient documentation

## 2011-01-22 DIAGNOSIS — Z01812 Encounter for preprocedural laboratory examination: Secondary | ICD-10-CM | POA: Insufficient documentation

## 2011-01-22 HISTORY — PX: KNEE CLOSED REDUCTION: SHX995

## 2011-01-22 SURGERY — MANIPULATION, KNEE, CLOSED
Anesthesia: General | Site: Knee | Laterality: Right

## 2011-01-22 MED ORDER — HYDROMORPHONE HCL PF 1 MG/ML IJ SOLN
INTRAMUSCULAR | Status: AC
  Filled 2011-01-22: qty 1

## 2011-01-22 MED ORDER — MORPHINE SULFATE 2 MG/ML IJ SOLN: 0.0500 mg/kg | INTRAMUSCULAR | Status: DC | PRN

## 2011-01-22 MED ORDER — ONDANSETRON HCL 4 MG/2ML IJ SOLN: 4.0000 mg | Freq: Once | INTRAMUSCULAR | Status: DC | PRN

## 2011-01-22 MED ORDER — BUPIVACAINE HCL 0.25 % IJ SOLN
INTRAMUSCULAR | Status: DC | PRN
  Administered 2011-01-22: 4 mL

## 2011-01-22 MED ORDER — FENTANYL CITRATE 0.05 MG/ML IJ SOLN
INTRAMUSCULAR | Status: DC | PRN
  Administered 2011-01-22 (×2): 25 ug via INTRAVENOUS
  Administered 2011-01-22: 50 ug via INTRAVENOUS

## 2011-01-22 MED ORDER — METHYLPREDNISOLONE ACETATE PF 40 MG/ML IJ SUSP
INTRAMUSCULAR | Status: DC | PRN
  Administered 2011-01-22: 40 mg via INTRA_ARTICULAR

## 2011-01-22 MED ORDER — LACTATED RINGERS IV SOLN
INTRAVENOUS | Status: DC | PRN
  Administered 2011-01-22: 15:00:00 via INTRAVENOUS

## 2011-01-22 MED ORDER — LACTATED RINGERS IV SOLN
INTRAVENOUS | Status: DC
  Administered 2011-01-22: 14:00:00 via INTRAVENOUS

## 2011-01-22 MED ORDER — PROPOFOL 10 MG/ML IV EMUL
INTRAVENOUS | Status: DC | PRN
  Administered 2011-01-22: 200 mg via INTRAVENOUS

## 2011-01-22 MED ORDER — ONDANSETRON HCL 4 MG/2ML IJ SOLN
INTRAMUSCULAR | Status: DC | PRN
  Administered 2011-01-22: 4 mg via INTRAVENOUS

## 2011-01-22 MED ORDER — MEPERIDINE HCL 25 MG/ML IJ SOLN: 6.2500 mg | INTRAMUSCULAR | Status: DC | PRN

## 2011-01-22 MED ORDER — HYDROMORPHONE HCL PF 1 MG/ML IJ SOLN
0.2500 mg | INTRAMUSCULAR | Status: DC | PRN
  Administered 2011-01-22: 0.25 mg via INTRAVENOUS
  Administered 2011-01-22 (×2): 0.5 mg via INTRAVENOUS

## 2011-01-22 MED ORDER — OXYCODONE-ACETAMINOPHEN 5-325 MG PO TABS: 1.0000 | ORAL_TABLET | ORAL | Status: AC | PRN

## 2011-01-22 SURGICAL SUPPLY — 5 items
BANDAID FLEXIBLE 1X3 (GAUZE/BANDAGES/DRESSINGS) ×2 IMPLANT
KIT ROOM TURNOVER OR (KITS) ×2 IMPLANT
NEEDLE 18GX1X1/2 (RX/OR ONLY) (NEEDLE) ×2 IMPLANT
PAD ARMBOARD 7.5X6 YLW CONV (MISCELLANEOUS) ×4 IMPLANT
SYR CONTROL 10ML LL (SYRINGE) ×2 IMPLANT

## 2011-01-22 NOTE — Anesthesia Postprocedure Evaluation (Signed)
  Anesthesia Post-op Note  Patient: ELVY MCLARTY  Procedure(s) Performed:  CLOSED MANIPULATION KNEE - Manipulation under anesthesia right knee  Patient Location: PACU  Anesthesia Type: General  Level of Consciousness: awake, alert  and oriented  Airway and Oxygen Therapy: Patient Spontanous Breathing and Patient connected to nasal cannula oxygen  Post-op Pain: mild  Post-op Assessment: Post-op Vital signs reviewed, Patient's Cardiovascular Status Stable, Respiratory Function Stable, Patent Airway, No signs of Nausea or vomiting and Pain level controlled  Post-op Vital Signs: Reviewed and stable  Complications: No apparent anesthesia complications

## 2011-01-22 NOTE — Anesthesia Procedure Notes (Signed)
Procedure Name: LMA Insertion Date/Time: 01/22/2011 2:43 PM Performed by: Romie Minus Pre-anesthesia Checklist: Patient identified, Emergency Drugs available, Suction available and Patient being monitored Patient Re-evaluated:Patient Re-evaluated prior to inductionOxygen Delivery Method: Circle System Utilized Preoxygenation: Pre-oxygenation with 100% oxygen Intubation Type: IV induction Ventilation: Mask ventilation without difficulty LMA: LMA with gastric port inserted LMA Size: 4.0 Number of attempts: 1 Tube secured with: Tape

## 2011-01-22 NOTE — Preoperative (Signed)
Beta Blockers   Reason not to administer Beta Blockers:Not Applicable 

## 2011-01-22 NOTE — Transfer of Care (Signed)
Immediate Anesthesia Transfer of Care Note  Patient: [name] [name]  Procedure(s) Performed:  CLOSED MANIPULATION KNEE - Manipulation under anesthesia right knee  Patient Location: PACU  Anesthesia Type: General  Level of Consciousness: awake  Airway & Oxygen Therapy: Patient Spontanous Breathing and Patient connected to nasal cannula oxygen  Post-op Assessment: Report given to PACU RN and Post -op Vital signs reviewed and stable  Post vital signs: stable  Complications: No apparent anesthesia complications

## 2011-01-22 NOTE — Progress Notes (Signed)
Dr Katrinka Blazing aware pt BP is 190/75.  No orders received.  Will monitor.

## 2011-01-22 NOTE — H&P (Signed)
[name] [name] is an 78 y.o. female.   Chief Complaint:   Right knee stiffness/arthrofibrosis post total knee replacement HPI: 78 yo female status-post a right total knee replacement.  She has been slowed down by post-op scar tissue/arthrofibrosis and has had difficulty gaining full motion of her knee.  She now presents for a manipulation under anesthesia of her right knee.  Past Medical History  Diagnosis Date  . Chest heaviness Sept 8th, 2012    negative stress test in the hospital Sept 2012  . Hypertension   . GERD (gastroesophageal reflux disease)   . MVP (mitral valve prolapse)   . PVC's (premature ventricular contractions)   . Hiatal hernia   . Anemia   . Fatty liver   . Blind loop syndrome   . Loss of weight   . Schatzki's ring   . Abscess of intestine   . Vitamin B12 deficiency   . 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 Allergy     SEASONAL  . Anxiety   . Arthritis     KNEES/HANDS  . Cataract     BILATERAL    Past Surgical History  Procedure Date  . Hemicolectomy     and ileectomy Dr Rolene Course  . Rotator cuff repair   . Nuclear stress test Sept 2012    Normal  . Joint replacement     R knee  . Eye surgery     bilateral    Family History  Problem Relation Age of Onset  . Hypertension Sister   . Diabetes Sister   . Diabetes Brother   . Coronary artery disease Brother   . Deep vein thrombosis Sister    Social History:  reports that she has never smoked. She has never used smokeless tobacco. She reports that she does not drink alcohol or use illicit drugs.  Allergies: No Known Allergies  No current facility-administered medications on file as of 01/22/2011.   Medications Prior to Admission  Medication Sig Dispense Refill  . ALPRAZolam (XANAX) 0.25 MG tablet Take 0.25 mg by mouth 4 (four) times daily as needed. ANXIETY/ SPASM      . DEXILANT 60 MG capsule TAKE 1 CAPSULE DAILY 30 MIN PRIOR TO BREAKFAST  30 capsule  3  . fexofenadine (ALLEGRA)  180 MG tablet Take 180 mg by mouth as needed. ALLERGIES      . fish oil-omega-3 fatty acids 1000 MG capsule Take 3 g by mouth daily.        Marland Kitchen loratadine (CLARITIN) 10 MG tablet Take 10 mg by mouth as needed. ALLERGIES      . Probiotic Product (ALIGN) 4 MG CAPS Take by mouth daily.        . ramipril (ALTACE) 10 MG tablet Take 10 mg by mouth 2 (two) times daily.        . vitamin C (ASCORBIC ACID) 500 MG tablet Take 500 mg by mouth daily.        . Vitamin D, Ergocalciferol, (DRISDOL) 50000 UNITS CAPS Take 50,000 Units by mouth every 7 (seven) days.         Results for orders placed during the hospital encounter of 01/21/11 (from the past 48 hour(s))  SURGICAL PCR SCREEN     Status: Normal   Collection Time   01/21/11  9:25 AM      Component Value Range Comment   MRSA, PCR NEGATIVE  NEGATIVE     Staphylococcus aureus NEGATIVE  NEGATIVE    BASIC  METABOLIC PANEL     Status: Abnormal   Collection Time   01/21/11  9:32 AM      Component Value Range Comment   Sodium 136  135 - 145 (mEq/L)    Potassium 4.3  3.5 - 5.1 (mEq/L)    Chloride 101  96 - 112 (mEq/L)    CO2 26  19 - 32 (mEq/L)    Glucose, Bld 82  70 - 99 (mg/dL)    BUN 6  6 - 23 (mg/dL)    Creatinine, Ser 1.61  0.50 - 1.10 (mg/dL)    Calcium 9.5  8.4 - 10.5 (mg/dL)    GFR calc non Af Amer 60 (*) >90 (mL/min)    GFR calc Af Amer 69 (*) >90 (mL/min)   CBC     Status: Abnormal   Collection Time   01/21/11  9:32 AM      Component Value Range Comment   WBC 4.1  4.0 - 10.5 (K/uL)    RBC 4.27  3.87 - 5.11 (MIL/uL)    Hemoglobin 10.9 (*) 12.0 - 15.0 (g/dL)    HCT 09.6 (*) 04.5 - 46.0 (%)    MCV 81.0  78.0 - 100.0 (fL)    MCH 25.5 (*) 26.0 - 34.0 (pg)    MCHC 31.5  30.0 - 36.0 (g/dL)    RDW 40.9 (*) 81.1 - 15.5 (%)    Platelets 243  150 - 400 (K/uL)    Dg Chest 2 View  01/21/2011  *RADIOLOGY REPORT*  Clinical Data: Preoperative evaluation for right knee manipulation. Hypertension.  Nonsmoker.  No current chest complaints  CHEST - 2 VIEW   Comparison: 09/27/2010  Findings: Heart size is within normal limits.  Ectasia of the thoracic aorta is seen and would correlate with the patient's history of hypertension.  The lung fields are clear with the exception of subsegmental atelectasis at the right lung base.  No focal infiltrates or signs of congestive failure seen.  No pleural fluid or significant peribronchial cuffing is noted.  Bony structures appear intact  IMPRESSION: Stable aortic ectasia compatible with history of hypertension.  No new worrisome focal or acute abnormality seen.  Original Report Authenticated By: Bertha Stakes, M.D.    Review of Systems  All other systems reviewed and are negative.    Blood pressure 187/90, pulse 84, temperature 98.1 F (36.7 C), temperature source Oral, resp. rate 18, SpO2 100.00%. Physical Exam  Constitutional: She is oriented to person, place, and time. She appears well-developed and well-nourished.  HENT:  Head: Normocephalic and atraumatic.  Eyes: EOM are normal. Pupils are equal, round, and reactive to light.  Neck: Normal range of motion. Neck supple.  Cardiovascular: Normal rate and regular rhythm.   Respiratory: Effort normal and breath sounds normal.  GI: Soft. Bowel sounds are normal.  Musculoskeletal:       Right knee: She exhibits decreased range of motion.  Neurological: She is alert and oriented to person, place, and time.  Skin: Skin is warm and dry.  Psychiatric: She has a normal mood and affect.     Assessment/Plan To the OR today as an outpatient for manipulation of her right total knee joint under anesthesia.  She fully understands the risks and benefits.  Samy Ryner Y 01/22/2011, 1:44 PM

## 2011-01-22 NOTE — Anesthesia Preprocedure Evaluation (Addendum)
Anesthesia Evaluation  Patient identified by MRN, date of birth, ID band Patient awake    Reviewed: Allergy & Precautions, H&P , NPO status , Patient's Chart, lab work & pertinent test results  History of Anesthesia Complications Negative for: history of anesthetic complications  Airway Mallampati: I TM Distance: >3 FB Neck ROM: Full    Dental No notable dental hx. (+) Teeth Intact and Dental Advisory Given   Pulmonary  clear to auscultation  Pulmonary exam normal       Cardiovascular hypertension, Pt. on medications + dysrhythmias Regular Normal    Neuro/Psych  Neuromuscular disease    GI/Hepatic hiatal hernia, GERD-  Medicated and Controlled,  Endo/Other    Renal/GU      Musculoskeletal   Abdominal   Peds  Hematology   Anesthesia Other Findings   Reproductive/Obstetrics                          Anesthesia Physical Anesthesia Plan  ASA: II  Anesthesia Plan: General   Post-op Pain Management:    Induction: Intravenous  Airway Management Planned: LMA  Additional Equipment:   Intra-op Plan:   Post-operative Plan:   Informed Consent: I have reviewed the patients History and Physical, chart, labs and discussed the procedure including the risks, benefits and alternatives for the proposed anesthesia with the patient or authorized representative who has indicated his/her understanding and acceptance.   Dental advisory given  Plan Discussed with: CRNA, Anesthesiologist and Surgeon  Anesthesia Plan Comments:         Anesthesia Quick Evaluation

## 2011-01-23 ENCOUNTER — Encounter (HOSPITAL_COMMUNITY): Payer: Self-pay | Admitting: Orthopaedic Surgery

## 2011-01-23 NOTE — Op Note (Signed)
NAMETAMRA, KOOS NO.:  [id number]  MEDICAL RECORD NO.:  [id number]  LOCATION:  [facility]                         FACILITY:  [facility]  PHYSICIAN:  Vanita Panda. Magnus Ivan, M.D.DATE OF BIRTH:  December 07, 1928  DATE OF PROCEDURE:  01/22/2011 DATE OF DISCHARGE:  01/22/2011                              OPERATIVE REPORT   PREOPERATIVE DIAGNOSIS:  Right knee arthrofibrosis and decreased range of motion, post total knee arthroplasty.  POSTOPERATIVE DIAGNOSIS:  Right knee arthrofibrosis and decreased range of motion, post total knee arthroplasty.  PROCEDURE:  Right knee closed manipulation under anesthesia.  SURGEON:  Vanita Panda. Magnus Ivan M.D.  ANESTHESIA:  General.  BLOOD LOSS:  Not applicable.  COMPLICATION:  None.  INDICATIONS:  Ms. Salaz is an 78 year old female, who just a few months ago underwent a right total knee arthroplasty.  She had a postoperative pain and difficulty with gaining her full mobility after extensive physical therapy on her knee.  Her preoperative range of motion, she lacks full extension by between 5 and 10 degrees and can flex to between 80 and 85 degrees.  We wished to increase her motion with manipulation.  I explained the risks and benefits of this to her. She does wish to proceed with surgery.  PROCEDURE DESCRIPTION:  After informed consent was obtained, appropriate right knee was marked.  She was brought to the operating and placed supine on the operating table.  General anesthesia was then obtained.  A time-out was called and she was identified as correct patient and correct right knee.  I then performed gentle manipulation with both hyper extension and flexion of the knee, and really took my time doing this.  I did feel free scar tissue free up and we were able to get her lacking full extension by bowing about 3 degrees.  I could easily flex her to 120 degrees.  I then cleaned the knee with Betadine and alcohol prior to injection  of 4 mL of Marcaine and 1 mL of Depo-Medrol.  We placed a Band-Aid over this.  She was awakened, extubated and taken recovery room in stable condition.  Postoperatively, we will get her knee moving with extensive therapy, and I will see her back in the office in a couple weeks.     Vanita Panda. Magnus Ivan, M.D.     CYB/MEDQ  D:  01/22/2011  T:  01/23/2011  Job:  [id number]

## 2011-03-19 ENCOUNTER — Other Ambulatory Visit (HOSPITAL_COMMUNITY): Payer: Self-pay | Admitting: Orthopaedic Surgery

## 2011-03-25 ENCOUNTER — Encounter (HOSPITAL_COMMUNITY): Payer: Self-pay | Admitting: Pharmacy Technician

## 2011-03-26 ENCOUNTER — Encounter (HOSPITAL_COMMUNITY): Payer: Self-pay

## 2011-03-26 ENCOUNTER — Encounter (HOSPITAL_COMMUNITY)
Admission: RE | Admit: 2011-03-26 | Discharge: 2011-03-26 | Disposition: A | Discharge status: Home / Self Care | Payer: Medicare Other | Source: Ambulatory Visit | Attending: Orthopaedic Surgery | Admitting: Orthopaedic Surgery

## 2011-03-26 HISTORY — PX: ROTATOR CUFF REPAIR: SHX139

## 2011-03-26 HISTORY — PX: CATARACT EXTRACTION, BILATERAL: SHX1313

## 2011-03-26 LAB — BASIC METABOLIC PANEL
CO2: 25 mEq/L (ref 19–32)
Calcium: 9.6 mg/dL (ref 8.4–10.5)
GFR calc Af Amer: 68 mL/min — ABNORMAL LOW (ref >90)
GFR calc non Af Amer: 59 mL/min — ABNORMAL LOW (ref >90)
Sodium: 139 mEq/L (ref 135–145)

## 2011-03-26 LAB — CBC
MCH: 26 pg (ref 26.0–34.0)
MCHC: 32 g/dL (ref 30.0–36.0)
MCV: 81.1 fL (ref 78.0–100.0)
RBC: 4.12 MIL/uL (ref 3.87–5.11)
WBC: 5.2 10*3/uL (ref 4.0–10.5)

## 2011-03-26 LAB — SURGICAL PCR SCREEN
MRSA, PCR: NEGATIVE
Staphylococcus aureus: NEGATIVE

## 2011-03-26 NOTE — Patient Instructions (Signed)
20 [name] [name]  03/26/2011   Your procedure is scheduled on: 04-03-11  Report to [facility] at   0530     AM.  Call this number if you have problems the morning of surgery: [phone]   Remember:   Do not eat food:After Midnight.    Take these medicines the morning of surgery with A SIP OF WATER: Dexilant, Aprazolam, Allegra, Hydrocodone   Do not wear jewelry, make-up or nail polish.  Do not wear lotions, powders, or perfumes. You may wear deodorant.  Do not shave 48 hours prior to surgery.(no shaving legs or axilla)  Do not bring valuables to the hospital.  Contacts, dentures or bridgework may not be worn into surgery.  Leave suitcase in the car. After surgery it may be brought to your room.  For patients admitted to the hospital, checkout time is 11:00 AM the day of discharge.   Patients discharged the day of surgery will not be allowed to drive home.  Name and phone number of your driver: daughter- May Mihalic [phone] cell  Special Instructions: CHG Shower Use Special Wash: 1/2 bottle night before surgery and 1/2 bottle morning of surgery.(Avoid face and vagina)   Please read over the following fact sheets that you were given: MRSA Information, Incentive Spirometry, Blood Fact sheet.

## 2011-03-26 NOTE — Pre-Procedure Instructions (Addendum)
03-26-11 EKG(09-30-10)/ CXR (09-30-10 /01-21-11 )reports with chart.

## 2011-04-02 NOTE — Anesthesia Preprocedure Evaluation (Addendum)
Anesthesia Evaluation  Patient identified by MRN, date of birth, ID band Patient awake    Reviewed: Allergy & Precautions, H&P , NPO status , Patient's Chart, lab work & pertinent test results, reviewed documented beta blocker date and time   Airway Mallampati: II TM Distance: >3 FB Neck ROM: full    Dental  (+) Caps and Dental Advisory Given Bridge left upper side:   Pulmonary neg pulmonary ROS,  breath sounds clear to auscultation  Pulmonary exam normal       Cardiovascular Exercise Tolerance: Good hypertension, Pt. on medications + Valvular Problems/Murmurs MVP Rhythm:regular Rate:Normal  Had chest heaviness and did cardiolite 9/12, which was neg. For ischemia.  PVCs.  MVP   Neuro/Psych Anxiety negative neurological ROS  negative psych ROS   GI/Hepatic Neg liver ROS, hiatal hernia, GERD-  Medicated and Controlled,dysphagia   Endo/Other  negative endocrine ROS  Renal/GU negative Renal ROS  negative genitourinary   Musculoskeletal   Abdominal   Peds  Hematology negative hematology ROS (+) Blood dyscrasia, anemia , Hgb 10.7   Anesthesia Other Findings   Reproductive/Obstetrics negative OB ROS                         Anesthesia Physical Anesthesia Plan  ASA: III  Anesthesia Plan: General   Post-op Pain Management:    Induction:   Airway Management Planned: Oral ETT  Additional Equipment:   Intra-op Plan:   Post-operative Plan: Extubation in OR  Informed Consent: I have reviewed the patients History and Physical, chart, labs and discussed the procedure including the risks, benefits and alternatives for the proposed anesthesia with the patient or authorized representative who has indicated his/her understanding and acceptance.   Dental Advisory Given  Plan Discussed with: CRNA and Surgeon  Anesthesia Plan Comments:        Anesthesia Quick Evaluation

## 2011-04-03 ENCOUNTER — Encounter (HOSPITAL_COMMUNITY)
Admission: RE | Disposition: A | Discharge status: Home / Self Care | Payer: Self-pay | Source: Ambulatory Visit | Attending: Orthopaedic Surgery

## 2011-04-03 ENCOUNTER — Ambulatory Visit (HOSPITAL_COMMUNITY): Payer: Medicare Other | Admitting: Anesthesiology

## 2011-04-03 ENCOUNTER — Inpatient Hospital Stay (HOSPITAL_COMMUNITY)
Admission: RE | Admit: 2011-04-03 | Discharge: 2011-04-06 | DRG: 489 | Disposition: A | Discharge status: Home / Self Care | Payer: Medicare Other | Source: Ambulatory Visit | Attending: Orthopaedic Surgery | Admitting: Orthopaedic Surgery

## 2011-04-03 ENCOUNTER — Encounter (HOSPITAL_COMMUNITY): Payer: Self-pay | Admitting: *Deleted

## 2011-04-03 ENCOUNTER — Ambulatory Visit (HOSPITAL_COMMUNITY): Payer: Medicare Other

## 2011-04-03 ENCOUNTER — Encounter (HOSPITAL_COMMUNITY): Payer: Self-pay | Admitting: Anesthesiology

## 2011-04-03 DIAGNOSIS — I10 Essential (primary) hypertension: Secondary | ICD-10-CM | POA: Diagnosis present

## 2011-04-03 DIAGNOSIS — M659 Unspecified synovitis and tenosynovitis, unspecified site: Secondary | ICD-10-CM | POA: Diagnosis present

## 2011-04-03 DIAGNOSIS — I059 Rheumatic mitral valve disease, unspecified: Secondary | ICD-10-CM | POA: Diagnosis present

## 2011-04-03 DIAGNOSIS — K219 Gastro-esophageal reflux disease without esophagitis: Secondary | ICD-10-CM | POA: Diagnosis present

## 2011-04-03 DIAGNOSIS — M24669 Ankylosis, unspecified knee: Secondary | ICD-10-CM

## 2011-04-03 DIAGNOSIS — Z01812 Encounter for preprocedural laboratory examination: Secondary | ICD-10-CM

## 2011-04-03 DIAGNOSIS — T8489XA Other specified complication of internal orthopedic prosthetic devices, implants and grafts, initial encounter: Principal | ICD-10-CM | POA: Diagnosis present

## 2011-04-03 DIAGNOSIS — Y831 Surgical operation with implant of artificial internal device as the cause of abnormal reaction of the patient, or of later complication, without mention of misadventure at the time of the procedure: Secondary | ICD-10-CM | POA: Diagnosis present

## 2011-04-03 DIAGNOSIS — Z96659 Presence of unspecified artificial knee joint: Secondary | ICD-10-CM

## 2011-04-03 HISTORY — PX: I&D KNEE WITH POLY EXCHANGE: SHX5024

## 2011-04-03 HISTORY — PX: SYNOVECTOMY: SHX5180

## 2011-04-03 LAB — TYPE AND SCREEN: ABO/RH(D): A POS

## 2011-04-03 SURGERY — IRRIGATION AND DEBRIDEMENT KNEE WITH POLY EXCHANGE
Anesthesia: General | Site: Knee | Laterality: Right | Wound class: Clean

## 2011-04-03 MED ORDER — NEOSTIGMINE METHYLSULFATE 1 MG/ML IJ SOLN
INTRAMUSCULAR | Status: DC | PRN
  Administered 2011-04-03: 4 mg via INTRAVENOUS

## 2011-04-03 MED ORDER — RIVAROXABAN 10 MG PO TABS
10.0000 mg | ORAL_TABLET | Freq: Every day | ORAL | Status: DC
  Administered 2011-04-04 – 2011-04-06 (×3): 10 mg via ORAL
  Filled 2011-04-03 (×3): qty 1

## 2011-04-03 MED ORDER — PROPOFOL 10 MG/ML IV BOLUS
INTRAVENOUS | Status: DC | PRN
  Administered 2011-04-03: 120 mg via INTRAVENOUS

## 2011-04-03 MED ORDER — VITAMIN D (ERGOCALCIFEROL) 1.25 MG (50000 UNIT) PO CAPS
50000.0000 [IU] | ORAL_CAPSULE | ORAL | Status: DC
  Administered 2011-04-03: 50000 [IU] via ORAL
  Filled 2011-04-03: qty 1

## 2011-04-03 MED ORDER — FENTANYL CITRATE 0.05 MG/ML IJ SOLN
INTRAMUSCULAR | Status: DC | PRN
  Administered 2011-04-03 (×3): 50 ug via INTRAVENOUS
  Administered 2011-04-03: 100 ug via INTRAVENOUS

## 2011-04-03 MED ORDER — OXYCODONE HCL 5 MG PO TABS
5.0000 mg | ORAL_TABLET | ORAL | Status: DC | PRN
  Administered 2011-04-04: 5 mg via ORAL
  Filled 2011-04-03: qty 1

## 2011-04-03 MED ORDER — RAMIPRIL 10 MG PO CAPS
10.0000 mg | ORAL_CAPSULE | Freq: Two times a day (BID) | ORAL | Status: DC
  Administered 2011-04-03 – 2011-04-06 (×6): 10 mg via ORAL
  Filled 2011-04-03 (×7): qty 1

## 2011-04-03 MED ORDER — ROPIVACAINE HCL 5 MG/ML IJ SOLN
INTRAMUSCULAR | Status: AC
  Filled 2011-04-03: qty 30

## 2011-04-03 MED ORDER — ACETAMINOPHEN 650 MG RE SUPP: 650.0000 mg | Freq: Four times a day (QID) | RECTAL | Status: DC | PRN

## 2011-04-03 MED ORDER — ONDANSETRON HCL 4 MG/2ML IJ SOLN
INTRAMUSCULAR | Status: DC | PRN
  Administered 2011-04-03: 4 mg via INTRAVENOUS

## 2011-04-03 MED ORDER — ACETAMINOPHEN 10 MG/ML IV SOLN
INTRAVENOUS | Status: AC
  Filled 2011-04-03: qty 100

## 2011-04-03 MED ORDER — CEFAZOLIN SODIUM 1-5 GM-% IV SOLN
1.0000 g | Freq: Four times a day (QID) | INTRAVENOUS | Status: AC
  Administered 2011-04-03 – 2011-04-04 (×3): 1 g via INTRAVENOUS
  Filled 2011-04-03 (×3): qty 50

## 2011-04-03 MED ORDER — ONDANSETRON HCL 4 MG PO TABS: 4.0000 mg | ORAL_TABLET | Freq: Four times a day (QID) | ORAL | Status: DC | PRN

## 2011-04-03 MED ORDER — METOCLOPRAMIDE HCL 10 MG PO TABS: 5.0000 mg | ORAL_TABLET | Freq: Three times a day (TID) | ORAL | Status: DC | PRN

## 2011-04-03 MED ORDER — PHENOL 1.4 % MT LIQD: 1.0000 | OROMUCOSAL | Status: DC | PRN

## 2011-04-03 MED ORDER — VITAMIN C 500 MG PO TABS
500.0000 mg | ORAL_TABLET | Freq: Every day | ORAL | Status: DC
  Administered 2011-04-03 – 2011-04-06 (×4): 500 mg via ORAL
  Filled 2011-04-03 (×4): qty 1

## 2011-04-03 MED ORDER — SODIUM CHLORIDE 0.9 % IR SOLN
Status: DC | PRN
  Administered 2011-04-03: 1000 mL

## 2011-04-03 MED ORDER — LACTATED RINGERS IV SOLN: INTRAVENOUS | Status: DC

## 2011-04-03 MED ORDER — ALPRAZOLAM 0.25 MG PO TABS: 0.2500 mg | ORAL_TABLET | Freq: Four times a day (QID) | ORAL | Status: DC | PRN

## 2011-04-03 MED ORDER — ACETAMINOPHEN 10 MG/ML IV SOLN
INTRAVENOUS | Status: DC | PRN
  Administered 2011-04-03: 1000 mg via INTRAVENOUS

## 2011-04-03 MED ORDER — LACTATED RINGERS IV SOLN
INTRAVENOUS | Status: DC | PRN
  Administered 2011-04-03 (×2): via INTRAVENOUS

## 2011-04-03 MED ORDER — DEXAMETHASONE SODIUM PHOSPHATE 10 MG/ML IJ SOLN
INTRAMUSCULAR | Status: DC | PRN
  Administered 2011-04-03: 10 mg via INTRAVENOUS

## 2011-04-03 MED ORDER — MIDAZOLAM HCL 5 MG/5ML IJ SOLN
INTRAMUSCULAR | Status: DC | PRN
  Administered 2011-04-03 (×2): 1 mg via INTRAVENOUS

## 2011-04-03 MED ORDER — BUPIVACAINE HCL (PF) 0.25 % IJ SOLN
INTRAMUSCULAR | Status: AC
  Filled 2011-04-03: qty 30

## 2011-04-03 MED ORDER — RAMIPRIL 10 MG PO TABS: 10.0000 mg | ORAL_TABLET | Freq: Two times a day (BID) | ORAL | Status: DC

## 2011-04-03 MED ORDER — SODIUM CHLORIDE 0.9 % IV SOLN
INTRAVENOUS | Status: DC
  Administered 2011-04-03: 14:00:00 via INTRAVENOUS
  Administered 2011-04-04: 1000 mL via INTRAVENOUS

## 2011-04-03 MED ORDER — METOCLOPRAMIDE HCL 5 MG/ML IJ SOLN: 5.0000 mg | Freq: Three times a day (TID) | INTRAMUSCULAR | Status: DC | PRN

## 2011-04-03 MED ORDER — PANTOPRAZOLE SODIUM 40 MG PO TBEC
40.0000 mg | DELAYED_RELEASE_TABLET | Freq: Every day | ORAL | Status: DC
  Administered 2011-04-04 – 2011-04-06 (×3): 40 mg via ORAL
  Filled 2011-04-03 (×3): qty 1

## 2011-04-03 MED ORDER — METHOCARBAMOL 100 MG/ML IJ SOLN
500.0000 mg | Freq: Four times a day (QID) | INTRAVENOUS | Status: DC | PRN
  Administered 2011-04-03: 500 mg via INTRAVENOUS
  Filled 2011-04-03: qty 5

## 2011-04-03 MED ORDER — ALIGN 4 MG PO CAPS: 4.0000 mg | ORAL_CAPSULE | Freq: Every day | ORAL | Status: DC

## 2011-04-03 MED ORDER — ONDANSETRON HCL 4 MG/2ML IJ SOLN: 4.0000 mg | Freq: Four times a day (QID) | INTRAMUSCULAR | Status: DC | PRN

## 2011-04-03 MED ORDER — MENTHOL 3 MG MT LOZG: 1.0000 | LOZENGE | OROMUCOSAL | Status: DC | PRN

## 2011-04-03 MED ORDER — MORPHINE SULFATE 2 MG/ML IJ SOLN
1.0000 mg | INTRAMUSCULAR | Status: DC | PRN
  Administered 2011-04-03 – 2011-04-04 (×2): 1 mg via INTRAVENOUS
  Filled 2011-04-03 (×3): qty 1

## 2011-04-03 MED ORDER — HYDROCODONE-ACETAMINOPHEN 5-325 MG PO TABS
1.0000 | ORAL_TABLET | ORAL | Status: DC | PRN
  Administered 2011-04-04 – 2011-04-06 (×7): 2 via ORAL
  Filled 2011-04-03 (×8): qty 2

## 2011-04-03 MED ORDER — RISAQUAD PO CAPS
1.0000 | ORAL_CAPSULE | Freq: Every day | ORAL | Status: DC
  Administered 2011-04-03 – 2011-04-06 (×4): 1 via ORAL
  Filled 2011-04-03 (×4): qty 1

## 2011-04-03 MED ORDER — VITAMIN D3 25 MCG (1000 UNIT) PO TABS
1000.0000 [IU] | ORAL_TABLET | Freq: Every day | ORAL | Status: DC
  Administered 2011-04-04 – 2011-04-06 (×3): 1000 [IU] via ORAL
  Filled 2011-04-03 (×4): qty 1

## 2011-04-03 MED ORDER — VANCOMYCIN HCL IN DEXTROSE 1-5 GM/200ML-% IV SOLN
INTRAVENOUS | Status: AC
  Filled 2011-04-03: qty 200

## 2011-04-03 MED ORDER — DIPHENHYDRAMINE HCL 12.5 MG/5ML PO ELIX: 12.5000 mg | ORAL_SOLUTION | ORAL | Status: DC | PRN

## 2011-04-03 MED ORDER — DOCUSATE SODIUM 100 MG PO CAPS
100.0000 mg | ORAL_CAPSULE | Freq: Two times a day (BID) | ORAL | Status: DC
  Administered 2011-04-03 – 2011-04-06 (×6): 100 mg via ORAL
  Filled 2011-04-03 (×5): qty 1

## 2011-04-03 MED ORDER — VANCOMYCIN HCL IN DEXTROSE 1-5 GM/200ML-% IV SOLN
1000.0000 mg | INTRAVENOUS | Status: AC
  Administered 2011-04-03: 1000 mg via INTRAVENOUS

## 2011-04-03 MED ORDER — LORATADINE 10 MG PO TABS
10.0000 mg | ORAL_TABLET | Freq: Every day | ORAL | Status: DC
  Administered 2011-04-03 – 2011-04-06 (×4): 10 mg via ORAL
  Filled 2011-04-03 (×4): qty 1

## 2011-04-03 MED ORDER — ACETAMINOPHEN 325 MG PO TABS: 650.0000 mg | ORAL_TABLET | Freq: Four times a day (QID) | ORAL | Status: DC | PRN

## 2011-04-03 MED ORDER — METHOCARBAMOL 500 MG PO TABS
500.0000 mg | ORAL_TABLET | Freq: Four times a day (QID) | ORAL | Status: DC | PRN
  Administered 2011-04-04 – 2011-04-06 (×6): 500 mg via ORAL
  Filled 2011-04-03 (×6): qty 1

## 2011-04-03 MED ORDER — HYDROMORPHONE HCL PF 1 MG/ML IJ SOLN: 0.2500 mg | INTRAMUSCULAR | Status: DC | PRN

## 2011-04-03 MED ORDER — ROCURONIUM BROMIDE 100 MG/10ML IV SOLN
INTRAVENOUS | Status: DC | PRN
  Administered 2011-04-03: 30 mg via INTRAVENOUS

## 2011-04-03 MED ORDER — GLYCOPYRROLATE 0.2 MG/ML IJ SOLN
INTRAMUSCULAR | Status: DC | PRN
  Administered 2011-04-03: .5 mg via INTRAVENOUS

## 2011-04-03 MED ORDER — ZOLPIDEM TARTRATE 5 MG PO TABS
5.0000 mg | ORAL_TABLET | Freq: Every evening | ORAL | Status: DC | PRN
  Administered 2011-04-04: 5 mg via ORAL
  Filled 2011-04-03: qty 1

## 2011-04-03 SURGICAL SUPPLY — 59 items
BAG ZIPLOCK 12X15 (MISCELLANEOUS) ×2 IMPLANT
BANDAGE ELASTIC 4 VELCRO ST LF (GAUZE/BANDAGES/DRESSINGS) ×2 IMPLANT
BANDAGE ELASTIC 6 VELCRO ST LF (GAUZE/BANDAGES/DRESSINGS) ×2 IMPLANT
BANDAGE ESMARK 6X9 LF (GAUZE/BANDAGES/DRESSINGS) ×1 IMPLANT
BLADE SAG 18X100X1.27 (BLADE) ×2 IMPLANT
BLADE SAW SGTL 13.0X1.19X90.0M (BLADE) ×2 IMPLANT
BNDG ESMARK 6X9 LF (GAUZE/BANDAGES/DRESSINGS) ×2
CLOTH BEACON ORANGE TIMEOUT ST (SAFETY) ×2 IMPLANT
CUFF TOURN SGL QUICK 34 (TOURNIQUET CUFF) ×1
CUFF TRNQT CYL 34X4X40X1 (TOURNIQUET CUFF) ×1 IMPLANT
DRAPE ORTHO SPLIT 77X108 STRL (DRAPES) ×2
DRAPE POUCH INSTRU U-SHP 10X18 (DRAPES) ×2 IMPLANT
DRAPE SURG ORHT 6 SPLT 77X108 (DRAPES) ×2 IMPLANT
DRAPE U-SHAPE 47X51 STRL (DRAPES) ×2 IMPLANT
DRSG PAD ABDOMINAL 8X10 ST (GAUZE/BANDAGES/DRESSINGS) ×2 IMPLANT
DURAPREP 26ML APPLICATOR (WOUND CARE) ×2 IMPLANT
ELECT REM PT RETURN 9FT ADLT (ELECTROSURGICAL) ×2
ELECTRODE REM PT RTRN 9FT ADLT (ELECTROSURGICAL) ×1 IMPLANT
EVACUATOR 1/8 PVC DRAIN (DRAIN) ×2 IMPLANT
FACESHIELD LNG OPTICON STERILE (SAFETY) ×10 IMPLANT
GAUZE XEROFORM 4X4 STRL (GAUZE/BANDAGES/DRESSINGS) ×2 IMPLANT
GAUZE XEROFORM 5X9 LF (GAUZE/BANDAGES/DRESSINGS) ×2 IMPLANT
GLOVE BIOGEL PI IND STRL 7.0 (GLOVE) ×2 IMPLANT
GLOVE BIOGEL PI INDICATOR 7.0 (GLOVE) ×2
GLOVE ORTHO TXT STRL SZ7.5 (GLOVE) ×2 IMPLANT
GLOVE SURG SS PI 6.5 STRL IVOR (GLOVE) ×4 IMPLANT
GLOVE SURG SS PI 7.0 STRL IVOR (GLOVE) ×4 IMPLANT
GLOVE SURG SS PI 8.0 STRL IVOR (GLOVE) ×2 IMPLANT
GOWN BRE IMP PREV XXLGXLNG (GOWN DISPOSABLE) ×2 IMPLANT
GOWN STRL NON-REIN LRG LVL3 (GOWN DISPOSABLE) ×2 IMPLANT
GOWN STRL REIN XL XLG (GOWN DISPOSABLE) ×4 IMPLANT
HANDPIECE INTERPULSE COAX TIP (DISPOSABLE) ×1
IMMOBILIZER KNEE 20 (SOFTGOODS)
IMMOBILIZER KNEE 20 THIGH 36 (SOFTGOODS) IMPLANT
KIT BASIN OR (CUSTOM PROCEDURE TRAY) ×2 IMPLANT
NEEDLE HYPO 21X1.5 SAFETY (NEEDLE) ×2 IMPLANT
NS IRRIG 1000ML POUR BTL (IV SOLUTION) ×2 IMPLANT
PACK TOTAL JOINT (CUSTOM PROCEDURE TRAY) ×2 IMPLANT
PAD CAST 4YDX4 CTTN HI CHSV (CAST SUPPLIES) ×2 IMPLANT
PADDING CAST COTTON 4X4 STRL (CAST SUPPLIES) ×2
PADDING CAST COTTON 6X4 STRL (CAST SUPPLIES) ×4 IMPLANT
PLATE ROT INSERT 10MM SIZE 4 (Plate) ×2 IMPLANT
POSITIONER SURGICAL ARM (MISCELLANEOUS) ×2 IMPLANT
SET HNDPC FAN SPRY TIP SCT (DISPOSABLE) ×1 IMPLANT
SET PAD KNEE POSITIONER (MISCELLANEOUS) ×2 IMPLANT
SPONGE GAUZE 4X4 12PLY (GAUZE/BANDAGES/DRESSINGS) ×2 IMPLANT
SPONGE LAP 18X18 X RAY DECT (DISPOSABLE) ×2 IMPLANT
STAPLER VISISTAT 35W (STAPLE) ×2 IMPLANT
SUCTION FRAZIER 12FR DISP (SUCTIONS) ×2 IMPLANT
SUT VIC AB 0 CT1 27 (SUTURE)
SUT VIC AB 0 CT1 27XBRD ANTBC (SUTURE) IMPLANT
SUT VIC AB 1 CT1 27 (SUTURE)
SUT VIC AB 1 CT1 27XBRD ANTBC (SUTURE) IMPLANT
SUT VIC AB 2-0 CT1 27 (SUTURE) ×2
SUT VIC AB 2-0 CT1 TAPERPNT 27 (SUTURE) ×2 IMPLANT
SYR CONTROL 10ML LL (SYRINGE) ×2 IMPLANT
TOWEL OR 17X26 10 PK STRL BLUE (TOWEL DISPOSABLE) ×4 IMPLANT
TRAY FOLEY CATH 14FRSI W/METER (CATHETERS) ×2 IMPLANT
WATER STERILE IRR 1500ML POUR (IV SOLUTION) ×2 IMPLANT

## 2011-04-03 NOTE — Anesthesia Procedure Notes (Signed)
Anesthesia Regional Block: Narrative:       

## 2011-04-03 NOTE — Anesthesia Postprocedure Evaluation (Signed)
  Anesthesia Post-op Note  Patient: [name] [name]  Procedure(s) Performed: Procedure(s) (LRB): IRRIGATION AND DEBRIDEMENT KNEE WITH POLY EXCHANGE (Right) SYNOVECTOMY (Right)  Patient Location: PACU  Anesthesia Type: GA combined with regional for post-op pain  Level of Consciousness: awake and alert   Airway and Oxygen Therapy: Patient Spontanous Breathing  Post-op Pain: mild  Post-op Assessment: Post-op Vital signs reviewed, Patient's Cardiovascular Status Stable, Respiratory Function Stable, Patent Airway and No signs of Nausea or vomiting  Post-op Vital Signs: stable  Complications: No apparent anesthesia complications

## 2011-04-03 NOTE — Plan of Care (Signed)
Problem: Consults Goal: Diagnosis- Total Joint Replacement Right total knee revision

## 2011-04-03 NOTE — H&P (Signed)
[name] [name] is an 78 y.o. female.   Chief Complaint:   Poor motion right total knee HPI: 78 yo female who last year underwent a right total knee replacement due to severe arthritis.  Developed scar tissue post-operatively.  Also, due to pain, could not get her full motion.  Was taken to the OR for a manipulation under anesthesia which improved her motion greatly, but she was not able to maintain this.  She now presents for an open lysis of adhesions, synovectomy, and downsizing of the poly insert of her current total knee.  She understands that she may need a revision pending our intraoperative findings.  She understands the risks and benefits involved.  Past Medical History  Diagnosis Date  . Chest heaviness Sept 8th, 2012    negative stress test in the hospital Sept 2012  . Hypertension   . GERD (gastroesophageal reflux disease)   . MVP (mitral valve prolapse)   . PVC's (premature ventricular contractions)   . Hiatal hernia   . Anemia   . Fatty liver   . Blind loop syndrome   . Loss of weight   . Schatzki's ring   . Abscess of intestine   . Vitamin B12 deficiency   . 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 Allergy     SEASONAL  . Arthritis     KNEES/HANDS  . Cataract     BILATERAL  . Anxiety 03-26-11    concerns of pt. spouse dx. with Alzheimers, she cares for at home.    Past Surgical History  Procedure Date  . Hemicolectomy     and ileectomy Dr Rolene Course  . Rotator cuff repair 03-26-11    right arm  . Nuclear stress test Sept 2012    Normal  . Joint replacement     R knee  . Knee closed reduction 01/22/2011    Procedure: CLOSED MANIPULATION KNEE;  Surgeon: Kathryne Hitch;  Location: [facility] OR;  Service: Orthopedics;  Laterality: Right;  Manipulation under anesthesia right knee  . Eye surgery 03-26-11    bilateral cataract    Family History  Problem Relation Age of Onset  . Hypertension Sister   . Diabetes Sister   . Diabetes Brother   . Coronary  artery disease Brother   . Deep vein thrombosis Sister    Social History:  reports that she has never smoked. She has never used smokeless tobacco. She reports that she does not drink alcohol or use illicit drugs.  Allergies: No Known Allergies  Medications Prior to Admission  Medication Dose Route Frequency Provider Last Rate Last Dose  . vancomycin (VANCOCIN) IVPB 1000 mg/200 mL premix  1,000 mg Intravenous 60 min Pre-Op Kathryne Hitch, MD       Medications Prior to Admission  Medication Sig Dispense Refill  . ALPRAZolam (XANAX) 0.25 MG tablet Take 0.25 mg by mouth 4 (four) times daily as needed. ANXIETY/ SPASM      . DEXILANT 60 MG capsule TAKE 1 CAPSULE DAILY 30 MIN PRIOR TO BREAKFAST  30 capsule  3  . fexofenadine (ALLEGRA) 180 MG tablet Take 180 mg by mouth daily as needed. ALLERGIES      . fish oil-omega-3 fatty acids 1000 MG capsule Take 3 g by mouth daily.       Marland Kitchen loratadine (CLARITIN) 10 MG tablet Take 10 mg by mouth daily as needed. ALLERGIES      . Probiotic Product (ALIGN) 4 MG CAPS Take 4 mg  by mouth daily.       . ramipril (ALTACE) 10 MG tablet Take 10 mg by mouth 2 (two) times daily.        . vitamin C (ASCORBIC ACID) 500 MG tablet Take 500 mg by mouth daily.        . Vitamin D, Ergocalciferol, (DRISDOL) 50000 UNITS CAPS Take 50,000 Units by mouth every 7 (seven) days. Friday         Results for orders placed during the hospital encounter of 04/03/11 (from the past 48 hour(s))  TYPE AND SCREEN     Status: Normal (Preliminary result)   Collection Time   04/03/11  5:50 AM      Component Value Range Comment   ABO/RH(D) A POS      Antibody Screen PENDING      Sample Expiration 04/06/2011      No results found.  ROS  Blood pressure 156/73, pulse 70, temperature 97.6 F (36.4 C), resp. rate 20, SpO2 98.00%. Physical Exam  Constitutional: She is oriented to person, place, and time. She appears well-developed and well-nourished.  HENT:  Head: Normocephalic  and atraumatic.  Eyes: EOM are normal. Pupils are equal, round, and reactive to light.  Neck: Normal range of motion. Neck supple.  Cardiovascular: Normal rate and regular rhythm.   Respiratory: Effort normal and breath sounds normal.  GI: Soft. Bowel sounds are normal.  Musculoskeletal:       Right knee: She exhibits decreased range of motion and swelling.  Neurological: She is alert and oriented to person, place, and time.  Skin: Skin is warm and dry.  Psychiatric: She has a normal mood and affect.     Assessment/Plan To the OR today for open lysis of adhesions, synovectomy and poly exchange of her right total knee replacement.  Ehtan Delfavero Y 04/03/2011, 6:40 AM

## 2011-04-03 NOTE — Brief Op Note (Signed)
04/03/2011  9:03 AM  PATIENT:  [name] [name]  78 y.o. female  PRE-OPERATIVE DIAGNOSIS:  arthrofibrosis right total knee replacement  POST-OPERATIVE DIAGNOSIS:  Arthrofibrosis right total knee replacement  PROCEDURE:  Procedure(s) (LRB): IRRIGATION AND DEBRIDEMENT KNEE WITH POLY EXCHANGE (Right) SYNOVECTOMY (Right)  SURGEON:  Surgeon(s) and Role:    * Kathryne Hitch, MD - Primary  PHYSICIAN ASSISTANT:   ASSISTANTS: Maud Deed, PA-C   ANESTHESIA:   regional and general  EBL:  Total I/O In: 1200 [I.V.:1200] Out: 200 [Urine:100; Blood:100]  BLOOD ADMINISTERED:none  DRAINS: medium hemovac  LOCAL MEDICATIONS USED:  NONE  SPECIMEN:  No Specimen  DISPOSITION OF SPECIMEN:  N/A  COUNTS:  YES  TOURNIQUET:   Total Tourniquet Time Documented: Thigh (Right) - 30 minutes  DICTATION: .Other Dictation: Dictation Number [id number]  PLAN OF CARE: Admit to inpatient   PATIENT DISPOSITION:  PACU - hemodynamically stable.   Delay start of Pharmacological VTE agent (>24hrs) due to surgical blood loss or risk of bleeding: yes

## 2011-04-03 NOTE — Transfer of Care (Signed)
Immediate Anesthesia Transfer of Care Note  Patient: [name] [name]  Procedure(s) Performed: Procedure(s) (LRB): IRRIGATION AND DEBRIDEMENT KNEE WITH POLY EXCHANGE (Right) SYNOVECTOMY (Right)  Patient Location: PACU  Anesthesia Type: General  Level of Consciousness: awake, alert , oriented and patient cooperative  Airway & Oxygen Therapy: Patient Spontanous Breathing and Patient connected to face mask oxygen  Post-op Assessment: Report given to PACU RN, Post -op Vital signs reviewed and stable and Patient moving all extremities  Post vital signs: Reviewed and stable  Complications: No apparent anesthesia complications

## 2011-04-03 NOTE — Progress Notes (Signed)
Utilization review completed.  

## 2011-04-04 LAB — BASIC METABOLIC PANEL
BUN: 7 mg/dL (ref 6–23)
CO2: 25 mEq/L (ref 19–32)
Chloride: 106 mEq/L (ref 96–112)
GFR calc non Af Amer: 78 mL/min — ABNORMAL LOW (ref >90)
Glucose, Bld: 86 mg/dL (ref 70–99)
Potassium: 4.6 mEq/L (ref 3.5–5.1)
Sodium: 138 mEq/L (ref 135–145)

## 2011-04-04 LAB — CBC
HCT: 27.9 % — ABNORMAL LOW (ref 36.0–46.0)
Hemoglobin: 9 g/dL — ABNORMAL LOW (ref 12.0–15.0)
MCHC: 32.3 g/dL (ref 30.0–36.0)
RBC: 3.41 MIL/uL — ABNORMAL LOW (ref 3.87–5.11)
WBC: 9.6 10*3/uL (ref 4.0–10.5)

## 2011-04-04 NOTE — Progress Notes (Signed)
Cm spoke with pt concerning d/c planning. Pt states using HH service upon surgery 5 months ago, but unsure of agency. Pt given choice list for [facility]. Will continue to follow.   Leonie Green [phone]

## 2011-04-04 NOTE — Evaluation (Signed)
Physical Therapy Evaluation Patient Details Name: [name] [name] MRN: [id number] DOB: [birth year] Today's Date: 04/04/2011  Problem List:  Patient Active Problem List  Diagnoses  . B12 DEFICIENCY  . HYPERTENSION  . MITRAL VALVE PROLAPSE  . PREMATURE VENTRICULAR CONTRACTIONS  . ESOPHAGITIS, REFLUX  . GERD  . GASTRITIS, CHRONIC  . GASTRITIS  . HIATAL HERNIA  . ABSCESS OF INTESTINE  . BLIND LOOP SYNDROME  . WEIGHT LOSS-ABNORMAL  . HOARSENESS  . DYSPHAGIA  . DIARRHEA  . CELIAC SPRUE  . DYSPHAGIA UNSPECIFIED  . Bacterial overgrowth syndrome  . Status post right hemicolectomy  . Esophageal dysphagia  . B12 deficiency  . Chest pain  . Fibrosis of knee joint  . Ankylosis of knee joint    Past Medical History:  Past Medical History  Diagnosis Date  . Chest heaviness Sept 8th, 2012    negative stress test in the hospital Sept 2012  . Hypertension   . GERD (gastroesophageal reflux disease)   . MVP (mitral valve prolapse)   . PVC's (premature ventricular contractions)   . Hiatal hernia   . Anemia   . Fatty liver   . Blind loop syndrome   . Loss of weight   . Schatzki's ring   . Abscess of intestine   . Vitamin B12 deficiency   . 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 Allergy     SEASONAL  . Arthritis     KNEES/HANDS  . Cataract     BILATERAL  . Anxiety 03-26-11    concerns of pt. spouse dx. with Alzheimers, she cares for at home.   Past Surgical History:  Past Surgical History  Procedure Date  . Hemicolectomy     and ileectomy Dr Rolene Course  . Rotator cuff repair 03-26-11    right arm  . Nuclear stress test Sept 2012    Normal  . Joint replacement     R knee  . Knee closed reduction 01/22/2011    Procedure: CLOSED MANIPULATION KNEE;  Surgeon: Kathryne Hitch;  Location: [facility] OR;  Service: Orthopedics;  Laterality: Right;  Manipulation under anesthesia right knee  . Eye surgery 03-26-11    bilateral cataract    PT  Assessment/Plan/Recommendation PT Assessment Clinical Impression Statement: Pt with R TKR revision presents with decreased R LE strength/ROM and ltd functional mobility.   PT Recommendation/Assessment: Patient will need skilled PT in the acute care venue PT Problem List: Decreased strength;Decreased range of motion;Decreased activity tolerance;Decreased knowledge of use of DME;Pain;Decreased mobility PT Therapy Diagnosis : Difficulty walking PT Plan PT Frequency: 7X/week PT Treatment/Interventions: DME instruction;Gait training;Stair training;Functional mobility training;Therapeutic activities;Therapeutic exercise;Patient/family education PT Recommendation Follow Up Recommendations: Home health PT Equipment Recommended: None recommended by PT PT Goals  Acute Rehab PT Goals PT Goal Formulation: With patient Time For Goal Achievement: 7 days Pt will go Supine/Side to Sit: with supervision PT Goal: Supine/Side to Sit - Progress: Goal set today Pt will go Sit to Supine/Side: with supervision PT Goal: Sit to Supine/Side - Progress: Goal set today Pt will go Sit to Stand: with supervision PT Goal: Sit to Stand - Progress: Goal set today Pt will go Stand to Sit: with supervision PT Goal: Stand to Sit - Progress: Goal set today Pt will Ambulate: 51 - 150 feet;with supervision;with rolling walker PT Goal: Ambulate - Progress: Goal set today Pt will Go Up / Down Stairs: 3-5 stairs;with min assist;with least restrictive assistive device PT Goal: Up/Down Stairs - Progress: Goal set  today  PT Evaluation Precautions/Restrictions  Precautions Precautions: Knee Required Braces or Orthoses: No Restrictions Weight Bearing Restrictions: No Other Position/Activity Restrictions: WBAT Prior Functioning  Home Living Lives With: Spouse Receives Help From: Family Type of Home: House Home Layout: One level Home Access: Stairs to enter Entrance Stairs-Rails: Left Entrance Stairs-Number of Steps:  5 Home Adaptive Equipment: Walker - rolling Prior Function Level of Independence: Independent with basic ADLs;Independent with homemaking with ambulation;Independent with gait;Independent with transfers Able to Take Stairs?: Yes Cognition Cognition Arousal/Alertness: Awake/alert Overall Cognitive Status: Appears within functional limits for tasks assessed Orientation Level: Oriented X4 Sensation/Coordination Coordination Gross Motor Movements are Fluid and Coordinated: Yes Extremity Assessment RUE Assessment RUE Assessment: Within Functional Limits LUE Assessment LUE Assessment: Within Functional Limits RLE Assessment RLE Assessment: Exceptions to WFL (3-/5 quads; -10 - 65) LLE Assessment LLE Assessment: Within Functional Limits Mobility (including Balance) Bed Mobility Bed Mobility: Yes Supine to Sit: 4: Min assist Supine to Sit Details (indicate cue type and reason): min cues for sequence and min assist with R LE Transfers Transfers: Yes Sit to Stand: 4: Min assist Sit to Stand Details (indicate cue type and reason): cues for use of UEs and for LE position Stand to Sit: 4: Min assist Stand to Sit Details: cues for use of UEs and for LE position Ambulation/Gait Ambulation/Gait: Yes Ambulation/Gait Assistance: 4: Min assist Ambulation/Gait Assistance Details (indicate cue type and reason): cues for posture. position from RW, stride length and sequence Ambulation Distance (Feet): 100 Feet Assistive device: Rolling walker Gait Pattern: Step-to pattern    Exercise  Total Joint Exercises Ankle Circles/Pumps: AROM;10 reps;Supine;Both Quad Sets: AROM;10 reps;Supine;Both Heel Slides: AAROM;10 reps;Supine;Right Straight Leg Raises: AAROM;10 reps;Supine;Right End of Session PT - End of Session Activity Tolerance: Patient tolerated treatment well Patient left: in chair;with call bell in reach Nurse Communication: Mobility status for transfers;Mobility status for  ambulation General Behavior During Session: [facility] for tasks performed  Giordan Fordham 04/04/2011, 1:09 PM

## 2011-04-04 NOTE — Progress Notes (Signed)
Physical Therapy Treatment Patient Details Name: [name] [name] MRN: [id number] DOB: [birth year] Today's Date: 04/04/2011  PT Assessment/Plan  PT - Assessment/Plan PT Plan: Discharge plan remains appropriate PT Frequency: 7X/week Follow Up Recommendations: Home health PT Equipment Recommended: None recommended by PT PT Goals  Acute Rehab PT Goals PT Goal Formulation: With patient Time For Goal Achievement: 7 days Pt will go Supine/Side to Sit: with supervision PT Goal: Supine/Side to Sit - Progress: Goal set today Pt will go Sit to Supine/Side: with supervision PT Goal: Sit to Supine/Side - Progress: Goal set today Pt will go Sit to Stand: with supervision PT Goal: Sit to Stand - Progress: Progressing toward goal Pt will go Stand to Sit: with supervision PT Goal: Stand to Sit - Progress: Progressing toward goal Pt will Ambulate: 51 - 150 feet;with supervision;with rolling walker PT Goal: Ambulate - Progress: Progressing toward goal Pt will Go Up / Down Stairs: 3-5 stairs;with min assist;with least restrictive assistive device PT Goal: Up/Down Stairs - Progress: Goal set today  PT Treatment Precautions/Restrictions  Precautions Precautions: Knee Required Braces or Orthoses: No Restrictions Weight Bearing Restrictions: No Other Position/Activity Restrictions: WBAT Mobility (including Balance) Bed Mobility Sit to Supine: 4: Min assist Sit to Supine - Details (indicate cue type and reason): min cues for sequence and min assist with R LE Transfers Sit to Stand: 4: Min assist Sit to Stand Details (indicate cue type and reason): cues for use of UEs and for LE position Stand to Sit: 4: Min assist Stand to Sit Details: cues for use of UEs and for LE position Ambulation/Gait Ambulation/Gait Assistance: 4: Min assist Ambulation/Gait Assistance Details (indicate cue type and reason): min cues for position from RW and posture Ambulation Distance (Feet): 200 Feet Assistive device:  Rolling walker Gait Pattern: Step-to pattern;Step-through pattern    Exercise    End of Session PT - End of Session Activity Tolerance: Patient tolerated treatment well Patient left: with call bell in reach;in bed;with family/visitor present Nurse Communication: Mobility status for transfers;Mobility status for ambulation General Behavior During Session: [facility] for tasks performed Cognition: [facility] for tasks performed  [name] [name] 04/04/2011, 4:37 PM

## 2011-04-04 NOTE — Progress Notes (Signed)
Occupational Therapy Note Spoke with patient who states that she doesn't feel she needs OT. She is familiar with all tasks from her initial surgery. She has higher commodes at home and has assist PRN with ADL. She plans to sponge bathe initially until able to get into the tub. Will sign off for OT. Judithann Sauger OTR/L [phone] 04/04/2011

## 2011-04-04 NOTE — Progress Notes (Signed)
Subjective: 1 Day Post-Op Procedure(s) (LRB): IRRIGATION AND DEBRIDEMENT KNEE WITH POLY EXCHANGE (Right) SYNOVECTOMY (Right) Patient reports pain as moderate.    Objective: Vital signs in last 24 hours: Temp:  [96.5 F (35.8 C)-98 F (36.7 C)] 97.9 F (36.6 C) (03/16 0622) Pulse Rate:  [47-80] 59  (03/16 0622) Resp:  [11-20] 16  (03/16 0622) BP: (114-159)/(63-80) 118/69 mmHg (03/16 0622) SpO2:  [100 %] 100 % (03/16 0622) Weight:  [72.122 kg (159 lb)] 72.122 kg (159 lb) (03/15 1212)  Intake/Output from previous day: 03/15 0701 - 03/16 0700 In: 2830 [P.O.:180; I.V.:2600; IV Piggyback:50] Out: 2900 [Urine:2675; Drains:125; Blood:100] Intake/Output this shift:     Basename 04/04/11 0446  HGB 9.0*    Basename 04/04/11 0446  WBC 9.6  RBC 3.41*  HCT 27.9*  PLT 195    Basename 04/04/11 0446  NA 138  K 4.6  CL 106  CO2 25  BUN 7  CREATININE 0.76  GLUCOSE 86  CALCIUM 8.8   No results found for this basename: LABPT:2,INR:2 in the last 72 hours  Neurovascular intact Sensation intact distally Intact pulses distally Incision: dressing C/D/I Compartment soft  Assessment/Plan: 1 Day Post-Op Procedure(s) (LRB): IRRIGATION AND DEBRIDEMENT KNEE WITH POLY EXCHANGE (Right) SYNOVECTOMY (Right) Up with therapy WBAT right knee  Lopaka Karge Y 04/04/2011, 9:07 AM

## 2011-04-04 NOTE — Op Note (Signed)
NAMEZARAYA, [name]:  [id number]  MEDICAL RECORD NO.:  [id number]  LOCATION:  1610                         FACILITY:  [facility]  PHYSICIAN:  Vanita Panda. 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  PROCEDURES: 1. Open lysis of adhesions, right total knee arthroplasty. 2. Right knee synovectomy. 3. Polyethylene exchange of right total knee arthroplasty.  FINDINGS:  No evidence of infection or loosening of prosthesis with significant scar tissue and synovitis.  Downsizing of polyethylene liner from 15 mm down to 10 mm.  SURGEON:  Vanita Panda. Magnus Ivan, M.D.  ASSISTANT:  Wende Neighbors, P.A.  ANESTHESIA: 1. Right leg femoral nerve block. 2. General.  TOURNIQUET TIME:  Less than 1 hour.  ESTIMATED BLOOD LOSS:  Less than 200 cc.  COMPLICATIONS:  None.  INDICATIONS:  [name] [name] is a very pleasant 78 year old female, who late last year performed a right total knee arthroplasty secondary to severe arthritis in her knee.  Postoperatively, she had certainly issues with pain as well as scar tissue and her range of motion was very limited.  She worked hard with Physical therapy and then earlier this year, I took her to the operating room for manipulation under anesthesia and I was able to get her flexion to almost 120 degrees and almost full extension; however, she quickly lost this again.  At this point, she wished to proceed with an open lysis of adhesions, synovectomy, and removal of as much scar tissue as possible with downsizing her poly to hopefully improve her motion.  The risks and benefits of this were explained to her in detail.  She did wish to proceed with  surgery.  DESCRIPTION OF PROCEDURE:  After informed consent was obtained, the appropriate right leg was marked and anesthesia obtained with a femoral nerve block.  She was then brought to the operating room and placed supine on the operating table.  General anesthesia was then obtained.  A nonsterile tourniquet was placed around her upper right leg and her right leg was prepped and draped from the thigh down to the ankle with DuraPrep and sterile drapes including a sterile stockinette.  A time-out was called to identify the correct patient and correct right knee.  I then used an Esmarch to wrap out the leg and the tourniquet was inflated to 300 mm of pressure.  I then made a midline incision directly over the old incision and dissected down to the knee joint.  There was a minimal effusion that was cleared.  I was there to remove her old size 60 mm poly and then perform a synovectomy as well as lysis of adhesions. Neither component was loose grossly at all.  I then trialed a 10-mm polyethylene inserts and there was some laxity in her knee, but I felt like most likely she would scar this __________ considering the way that these scars already.  I then copiously irrigated the tissues with 3 L normal saline solution using pulsatile  lavage, and I placed a real size 10 mm rotating platform polyethylene insert from superior.  I then closed the arthrotomy with a running V-Loc/Quill-type of suture.  I closed the deep tissue with 2-0 Vicryl and subcutaneous tissue with 2-0 Vicryl.  I placed interrupted staples on the skin.  Before closing, I did place a medium Hemovac in the arthrotomy.  Well-padded sterile dressing was applied, and again prior to closure with the tourniquet down hemostasis was obtained with electrocautery.  She was then awakened, extubated, and taken to the recovery room in stable condition. All final counts were correct.  There were no complications noted. Postoperatively, we  will work on aggressive range of motion as well.     Vanita Panda. Magnus Ivan, M.D.     CYB/MEDQ  D:  04/03/2011  T:  04/04/2011  Job:  [id number]

## 2011-04-05 LAB — CBC
HCT: 26.4 % — ABNORMAL LOW (ref 36.0–46.0)
Hemoglobin: 8.5 g/dL — ABNORMAL LOW (ref 12.0–15.0)
MCH: 26.6 pg (ref 26.0–34.0)
RBC: 3.19 MIL/uL — ABNORMAL LOW (ref 3.87–5.11)

## 2011-04-05 MED ORDER — BISACODYL 5 MG PO TBEC
5.0000 mg | DELAYED_RELEASE_TABLET | Freq: Every day | ORAL | Status: DC | PRN
  Administered 2011-04-05: 5 mg via ORAL
  Filled 2011-04-05: qty 2

## 2011-04-05 NOTE — Progress Notes (Signed)
Physical Therapy Treatment Patient Details Name: [name] [name] MRN: [id number] DOB: [birth year] Today's Date: 04/05/2011  PT Assessment/Plan  PT - Assessment/Plan Comments on Treatment Session: Pt progressing very well with therapy and hopeful for d/c home tomorrow. PT Plan: Discharge plan remains appropriate Follow Up Recommendations: Home health PT Equipment Recommended: None recommended by PT PT Goals  Acute Rehab PT Goals PT Goal: Supine/Side to Sit - Progress: Progressing toward goal PT Goal: Sit to Stand - Progress: Progressing toward goal PT Goal: Stand to Sit - Progress: Progressing toward goal Pt will Ambulate: >150 feet;with modified independence;with rolling walker PT Goal: Ambulate - Progress: Updated due to goal met  PT Treatment Precautions/Restrictions  Precautions Precautions: Knee Required Braces or Orthoses: No Restrictions Weight Bearing Restrictions: No Other Position/Activity Restrictions: WBAT Mobility (including Balance) Bed Mobility Bed Mobility: Yes Supine to Sit: 4: Min assist Supine to Sit Details (indicate cue type and reason): assist for R LE Transfers Transfers: Yes Sit to Stand: 4: Min assist;With upper extremity assist;From toilet;From bed Sit to Stand Details (indicate cue type and reason): min/guard, verbal cues for hand placement and use of grab bar in bathroom for toilet Stand to Sit: 4: Min assist;With upper extremity assist;To chair/3-in-1;To toilet Stand to Sit Details: min/guard, verbal cues for hand placement and use of grab bar in bathroom for toilet Ambulation/Gait Ambulation/Gait: Yes Ambulation/Gait Assistance: 5: Supervision Ambulation/Gait Assistance Details (indicate cue type and reason): verbal cues for stride length, pt able to recall good posture Ambulation Distance (Feet): 240 Feet Assistive device: Rolling walker Gait Pattern: Step-through pattern;Decreased stride length    Exercise  Total Joint Exercises Ankle  Circles/Pumps: AROM;Both;20 reps;Supine (exercises performed in recliner) Quad Sets: AROM;Strengthening;Both;20 reps;Supine Short Arc Quad: AAROM;Strengthening;Right;Supine;Other reps (comment) (20 reps) Heel Slides: AAROM;Right;20 reps;Supine (R knee AAROM approx. -3-45 degrees) Hip ABduction/ADduction: AAROM;Strengthening;Right;20 reps;Supine End of Session PT - End of Session Equipment Utilized During Treatment: Gait belt Activity Tolerance: Patient tolerated treatment well Patient left: in chair;with call bell in reach General Behavior During Session: [facility] for tasks performed Cognition: [facility] for tasks performed  Kista Robb,KATHrine E 04/05/2011, 11:59 AM Pager: [phone]

## 2011-04-05 NOTE — Progress Notes (Signed)
Subjective: 2 Days Post-Op Procedure(s) (LRB): IRRIGATION AND DEBRIDEMENT KNEE WITH POLY EXCHANGE (Right) SYNOVECTOMY (Right) Patient reports pain as moderate.    Objective: Vital signs in last 24 hours: Temp:  [97.9 F (36.6 C)-98.7 F (37.1 C)] 98 F (36.7 C) (03/17 0555) Pulse Rate:  [70-76] 76  (03/17 0555) Resp:  [16-20] 16  (03/17 0555) BP: (122-125)/(66-74) 122/69 mmHg (03/17 0555) SpO2:  [96 %-100 %] 96 % (03/17 0555)  Intake/Output from previous day: 03/16 0701 - 03/17 0700 In: 1070.5 [P.O.:600; I.V.:470.5] Out: 2800 [Urine:2800] Intake/Output this shift:     Basename 04/05/11 0516 04/04/11 0446  HGB 8.5* 9.0*    Basename 04/05/11 0516 04/04/11 0446  WBC 6.7 9.6  RBC 3.19* 3.41*  HCT 26.4* 27.9*  PLT 173 195    Basename 04/04/11 0446  NA 138  K 4.6  CL 106  CO2 25  BUN 7  CREATININE 0.76  GLUCOSE 86  CALCIUM 8.8   No results found for this basename: LABPT:2,INR:2 in the last 72 hours  Neurovascular intact Sensation intact distally Intact pulses distally Incision: scant drainage Compartment soft  Assessment/Plan: 2 Days Post-Op Procedure(s) (LRB): IRRIGATION AND DEBRIDEMENT KNEE WITH POLY EXCHANGE (Right) SYNOVECTOMY (Right) Up with therapy Continue CPM  Mikail Goostree Y 04/05/2011, 10:18 AM

## 2011-04-05 NOTE — Progress Notes (Signed)
Physical Therapy Treatment Patient Details Name: ESMAY AMSPACHER MRN: [id number] DOB: 1929-01-04 Today's Date: 04/05/2011  PT Assessment/Plan  PT - Assessment/Plan Comments on Treatment Session: Pt doing very well with mobility.  Pt did not wish to practice stairs today but aware she will need to practice them tomorrow prior to d/c. PT Plan: Discharge plan remains appropriate Follow Up Recommendations: Home health PT Equipment Recommended: None recommended by PT PT Goals  Acute Rehab PT Goals PT Goal: Sit to Stand - Progress: Met PT Goal: Stand to Sit - Progress: Met Pt will Ambulate: >150 feet;with modified independence;with rolling walker PT Goal: Ambulate - Progress: Progressing toward goal  PT Treatment Precautions/Restrictions  Precautions Precautions: Knee Required Braces or Orthoses: No Restrictions Weight Bearing Restrictions: No Other Position/Activity Restrictions: WBAT Mobility (including Balance) Bed Mobility Bed Mobility: No Transfers Transfers: Yes Sit to Stand: With upper extremity assist;From chair/3-in-1;5: Supervision;With armrests Sit to Stand Details (indicate cue type and reason): verbal cue to wait til RW in place prior to standing Stand to Sit: With upper extremity assist;To chair/3-in-1;5: Supervision;With armrests Stand to Sit Details: verbal cue for R LE forward Ambulation/Gait Ambulation/Gait: Yes Ambulation/Gait Assistance: 5: Supervision Ambulation/Gait Assistance Details (indicate cue type and reason): one verbal cue for safe turning, pt very motivated Ambulation Distance (Feet): 400 Feet Assistive device: Rolling walker Gait Pattern: Step-through pattern;Decreased stride length    Exercise    End of Session PT - End of Session Activity Tolerance: Patient tolerated treatment well Patient left: in chair;with call bell in reach;with family/visitor present General Behavior During Session: [facility] for tasks performed Cognition: [facility] for tasks  performed  Victormanuel Mclure,KATHrine E 04/05/2011, 4:18 PM Pager: [phone]

## 2011-04-05 NOTE — Progress Notes (Signed)
CARE MANAGEMENT NOTE 04/05/2011  Patient:  [name] [name], [name] [name]   Account Number:  [id number]  Date Initiated:  04/05/2011  Documentation initiated by:  Quetzally Callas  Subjective/Objective Assessment:   78 yo female admitted s/p open lysis of adhesions, synovectomy and poly exchange of her right total knee replacement.     Action/Plan:   Home when stable   Anticipated DC Date:  04/06/2011   Anticipated DC Plan:  HOME W HOME HEALTH SERVICES  In-house referral  NA      DC Planning Services  CM consult      [facility] Choice  HOME HEALTH   Choice offered to / List presented to:  C-1 Patient   DME arranged  NA      DME agency  NA     HH arranged  HH-2 PT      HH agency  [facility]   Status of service:  In process, will continue to follow Medicare Important Message given?   (If response is "NO", the following Medicare IM given date fields will be blank) Date Medicare IM given:   Date Additional Medicare IM given:    Discharge Disposition:    Per UR Regulation:    If discussed at Long Length of Stay Meetings, dates discussed:    Comments:  04/05/11 1040 [name] Blomgren,Rn,BSN [phone] Cm spoke with pt concerning d/c planning. Per pt choice [facility] to provide [facility] services. Pt states spouse will be at home with her but adult daughter to assist in home care. Pt has access to DME from previous surgery 5 months ago. no other services or DMe requested. [facility] notified of new referral. Clinicals placed in [facility]. Awaiting MD orders.

## 2011-04-06 LAB — CBC
HCT: 25.8 % — ABNORMAL LOW (ref 36.0–46.0)
MCH: 26.4 pg (ref 26.0–34.0)
MCV: 83 fL (ref 78.0–100.0)
RDW: 16.9 % — ABNORMAL HIGH (ref 11.5–15.5)
WBC: 5.7 10*3/uL (ref 4.0–10.5)

## 2011-04-06 MED ORDER — METHOCARBAMOL 500 MG PO TABS: 500.0000 mg | ORAL_TABLET | Freq: Four times a day (QID) | ORAL | Status: AC | PRN

## 2011-04-06 MED ORDER — OXYCODONE-ACETAMINOPHEN 5-325 MG PO TABS: 1.0000 | ORAL_TABLET | ORAL | Status: AC | PRN

## 2011-04-06 MED ORDER — RIVAROXABAN 10 MG PO TABS: 10.0000 mg | ORAL_TABLET | Freq: Every day | ORAL | Status: DC

## 2011-04-06 NOTE — Progress Notes (Signed)
Patient discharged to home with [facility] providing home health services. Patient has no complaints at discharge and vital signs are stable. Patient transported to home via private vehicle with family.

## 2011-04-06 NOTE — Discharge Summary (Signed)
Patient ID: [name] [name] MRN: [id number] DOB/AGE: Nov 29, 1928 78 y.o.  Admit date: 04/03/2011 Discharge date: 04/06/2011  Admission Diagnoses:  Principal Problem:  *Ankylosis of knee joint   Discharge Diagnoses:  Same  Past Medical History  Diagnosis Date  . Chest heaviness Sept 8th, 2012    negative stress test in the hospital Sept 2012  . Hypertension   . GERD (gastroesophageal reflux disease)   . MVP (mitral valve prolapse)   . PVC's (premature ventricular contractions)   . Hiatal hernia   . Anemia   . Fatty liver   . Blind loop syndrome   . Loss of weight   . Schatzki's ring   . Abscess of intestine   . Vitamin B12 deficiency   . 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 Allergy     SEASONAL  . Arthritis     KNEES/HANDS  . Cataract     BILATERAL  . Anxiety 03-26-11    concerns of pt. spouse dx. with Alzheimers, she cares for at home.    Surgeries: Procedure(s): IRRIGATION AND DEBRIDEMENT KNEE WITH POLY EXCHANGE SYNOVECTOMY on 04/03/2011   Consultants:    Discharged Condition: Improved  Hospital Course: [name] [name] is an 78 y.o. female who was admitted 04/03/2011 for operative treatment ofAnkylosis of knee joint. Patient has severe unremitting pain that affects sleep, daily activities, and work/hobbies. After pre-op clearance the patient was taken to the operating room on 04/03/2011 and underwent  Procedure(s): IRRIGATION AND DEBRIDEMENT KNEE WITH POLY EXCHANGE SYNOVECTOMY.    Patient was given perioperative antibiotics: Anti-infectives     Start     Dose/Rate Route Frequency Ordered Stop   04/03/11 1300   ceFAZolin (ANCEF) IVPB 1 g/50 mL premix        1 g 100 mL/hr over 30 Minutes Intravenous Every 6 hours 04/03/11 1207 04/04/11 0042   04/03/11 0600   vancomycin (VANCOCIN) IVPB 1000 mg/200 mL premix        1,000 mg 200 mL/hr over 60 Minutes Intravenous 60 min pre-op 04/03/11 0521 04/03/11 0748           Patient was given  sequential compression devices, early ambulation, and chemoprophylaxis to prevent DVT.  Patient benefited maximally from hospital stay and there were no complications.    Recent vital signs: Patient Vitals for the past 24 hrs:  BP Temp Temp src Pulse Resp SpO2  04/06/11 0522 124/60 mmHg 98.3 F (36.8 C) Oral 62  20  100 %  2011-04-09 2235 93/53 mmHg 98.4 F (36.9 C) Oral 80  16  94 %  April 09, 2011 1347 114/63 mmHg 99 F (37.2 C) Oral 87  18  98 %     Recent laboratory studies:  Basename 04/06/11 0512 April 09, 2011 0516 04/04/11 0446  WBC 5.7 6.7 --  HGB 8.2* 8.5* --  HCT 25.8* 26.4* --  PLT 183 173 --  NA -- -- 138  K -- -- 4.6  CL -- -- 106  CO2 -- -- 25  BUN -- -- 7  CREATININE -- -- 0.76  GLUCOSE -- -- 86  INR -- -- --  CALCIUM -- -- 8.8     Discharge Medications:   Medication List  As of 04/06/2011  6:48 AM   STOP taking these medications         HYDROcodone-acetaminophen 5-325 MG per tablet         TAKE these medications         acetaminophen 500 MG tablet  Commonly known as: TYLENOL   Take 1,000 mg by mouth every 6 (six) hours as needed. Pain      ALIGN 4 MG Caps   Take 4 mg by mouth daily.      ALPRAZolam 0.25 MG tablet   Commonly known as: XANAX   Take 0.25 mg by mouth 4 (four) times daily as needed. ANXIETY/ SPASM      cholecalciferol 1000 UNITS tablet   Commonly known as: VITAMIN D   Take 1,000 Units by mouth daily.      DEXILANT 60 MG capsule   Generic drug: dexlansoprazole   TAKE 1 CAPSULE DAILY 30 MIN PRIOR TO BREAKFAST      fexofenadine 180 MG tablet   Commonly known as: ALLEGRA   Take 180 mg by mouth daily as needed. ALLERGIES      fish oil-omega-3 fatty acids 1000 MG capsule   Take 3 g by mouth daily.      loratadine 10 MG tablet   Commonly known as: CLARITIN   Take 10 mg by mouth daily as needed. ALLERGIES      methocarbamol 500 MG tablet   Commonly known as: ROBAXIN   Take 1 tablet (500 mg total) by mouth every 6 (six) hours as needed.        oxyCODONE-acetaminophen 5-325 MG per tablet   Commonly known as: PERCOCET   Take 1-2 tablets by mouth every 4 (four) hours as needed for pain.      ramipril 10 MG tablet   Commonly known as: ALTACE   Take 10 mg by mouth 2 (two) times daily.      rivaroxaban 10 MG Tabs tablet   Commonly known as: XARELTO   Take 1 tablet (10 mg total) by mouth daily with breakfast.      vitamin C 500 MG tablet   Commonly known as: ASCORBIC ACID   Take 500 mg by mouth daily.      Vitamin D (Ergocalciferol) 50000 UNITS Caps   Commonly known as: DRISDOL   Take 50,000 Units by mouth every 7 (seven) days. Friday              Diagnostic Studies: X-ray Knee Right Port  04/03/2011  *RADIOLOGY REPORT*  Clinical Data: Postop right knee arthroplasty.  PORTABLE RIGHT KNEE - 1-2 VIEW  Comparison: 10/31/2010  Findings: Changes of right knee replacement.  No hardware or bony complicating feature.  Soft tissue drain in place.  IMPRESSION: Right knee replacement.  No complicating feature.  Original Report Authenticated By: Cyndie Chime, M.D.    Disposition: 01-Home or Self Care  Discharge Orders    Future Orders Please Complete By Expires   Diet - low sodium heart healthy      Call MD / Call 911      Comments:   If you experience chest pain or shortness of breath, CALL 911 and be transported to the hospital emergency room.  If you develope a fever above 101 F, pus (white drainage) or increased drainage or redness at the wound, or calf pain, call your surgeon's office.   Constipation Prevention      Comments:   Drink plenty of fluids.  Prune juice may be helpful.  You may use a stool softener, such as Colace (over the counter) 100 mg twice a day.  Use MiraLax (over the counter) for constipation as needed.   Increase activity slowly as tolerated      Weight Bearing as taught in Physical Therapy  Comments:   Use a walker or crutches as instructed.   Discharge instructions      Comments:   you  can get your actual incision wet starting 3/20.  Dry dressing daily. Use CPM for 2 hours twice daily.         SignedKathryne Hitch 04/06/2011, 6:48 AM

## 2011-04-06 NOTE — Progress Notes (Signed)
Physical Therapy Treatment Patient Details Name: [name] [name] MRN: [id number] DOB: May 06, 1928 Today's Date: 04/06/2011  PT Assessment/Plan  PT - Assessment/Plan Comments on Treatment Session: Pt to d/c home today.  Pt has met most goals and feels comfortable with d/c home.  No questions or concerns about d/c. PT Plan: Discharge plan remains appropriate Follow Up Recommendations: Home health PT Equipment Recommended: None recommended by PT PT Goals  Acute Rehab PT Goals PT Goal: Supine/Side to Sit - Progress: Met Pt will Ambulate: >150 feet;with modified independence;with rolling walker PT Goal: Ambulate - Progress: Met PT Goal: Up/Down Stairs - Progress: Met  PT Treatment Precautions/Restrictions  Precautions Precautions: Knee Required Braces or Orthoses: No Restrictions Weight Bearing Restrictions: No Other Position/Activity Restrictions: WBAT Mobility (including Balance) Bed Mobility Bed Mobility: Yes Supine to Sit: 5: Supervision Supine to Sit Details (indicate cue type and reason): assisted R LE by using UEs Transfers Transfers: Yes Sit to Stand: With upper extremity assist;With armrests;From bed;6: Modified independent (Device/Increase time) Stand to Sit: With upper extremity assist;To chair/3-in-1;With armrests;6: Modified independent (Device/Increase time) Ambulation/Gait Ambulation/Gait: Yes Ambulation/Gait Assistance: 6: Modified independent (Device/Increase time) Ambulation/Gait Assistance Details (indicate cue type and reason): pt able to correct RW distance without verbal cue, pt ambulated to stairs, performed stairs then back to room Ambulation Distance (Feet): 160 Feet (x2) Assistive device: Rolling walker Gait Pattern: Step-through pattern;Decreased stride length Stairs: Yes Stairs Assistance: 4: Min assist Stairs Assistance Details (indicate cue type and reason): min/guard, verbal cue for correct sequence, pt reports someone will be able to assist her  into her home, pt had rail on Left and 1 HHA (she reports she can reach both rails at home) Stair Management Technique: Step to pattern;Forwards;One rail Left (1 HHA) Number of Stairs: 4     Exercise    End of Session PT - End of Session Equipment Utilized During Treatment: Gait belt Activity Tolerance: Patient tolerated treatment well Patient left: in chair;with call bell in reach General Behavior During Session: [facility] for tasks performed Cognition: [facility] for tasks performed  Darnette Lampron,KATHrine E 04/06/2011, 11:52 AM Pager: [phone]

## 2011-04-06 NOTE — Progress Notes (Signed)
CARE MANAGEMENT NOTE 04/06/2011  Patient:  [name] [name], [name] [name]   Account Number:  [id number]  Date Initiated:  04/05/2011  Documentation initiated by:  DAVIS,TYMEEKA  Subjective/Objective Assessment:   78 yo female admitted s/p open lysis of adhesions, synovectomy and poly exchange of her right total knee replacement.     Action/Plan:   Home when stable   Anticipated DC Date:  04/06/2011   Anticipated DC Plan:  HOME W HOME HEALTH SERVICES  In-house referral  NA      DC Planning Services  CM consult      [facility] Choice  HOME HEALTH   Choice offered to / List presented to:  C-1 Patient   DME arranged  NA      DME agency  NA     HH arranged  HH-2 PT      HH agency  [facility]   Status of service:  Completed, signed off Medicare Important Message given?  NA - LOS <3 / Initial given by admissions (If response is "NO", the following Medicare IM given date fields will be blank) Date Medicare IM given:   Date Additional Medicare IM given:    Discharge Disposition:  HOME W HOME HEALTH SERVICES  Comments:  04/06/2011 [name] BSN CCM [phone] Plans are for discharge today with [facility] services to be provided by [facility] with start date of tomorrow 04/07/2011. Pt states she [employer] has CPM that was delivered to her home prior to her admission. States it was ordered by doctor's office. Already has other DME from previous surgery. List of choice HH agencies in shadow chart.

## 2011-04-15 ENCOUNTER — Other Ambulatory Visit: Payer: Self-pay | Admitting: Nurse Practitioner

## 2011-04-15 ENCOUNTER — Encounter (HOSPITAL_COMMUNITY): Payer: Self-pay | Admitting: Orthopaedic Surgery

## 2011-04-28 ENCOUNTER — Ambulatory Visit: Payer: Medicare Other | Attending: Orthopaedic Surgery

## 2011-04-28 DIAGNOSIS — R262 Difficulty in walking, not elsewhere classified: Secondary | ICD-10-CM | POA: Insufficient documentation

## 2011-04-28 DIAGNOSIS — M25569 Pain in unspecified knee: Secondary | ICD-10-CM | POA: Insufficient documentation

## 2011-04-28 DIAGNOSIS — IMO0001 Reserved for inherently not codable concepts without codable children: Secondary | ICD-10-CM | POA: Insufficient documentation

## 2011-04-28 DIAGNOSIS — Z96659 Presence of unspecified artificial knee joint: Secondary | ICD-10-CM | POA: Insufficient documentation

## 2011-04-28 DIAGNOSIS — M25669 Stiffness of unspecified knee, not elsewhere classified: Secondary | ICD-10-CM | POA: Insufficient documentation

## 2011-04-28 DIAGNOSIS — M6281 Muscle weakness (generalized): Secondary | ICD-10-CM | POA: Insufficient documentation

## 2011-04-30 ENCOUNTER — Ambulatory Visit: Payer: Medicare Other

## 2011-05-05 ENCOUNTER — Ambulatory Visit: Payer: Medicare Other

## 2011-05-07 ENCOUNTER — Ambulatory Visit: Payer: Medicare Other

## 2011-05-12 ENCOUNTER — Ambulatory Visit: Payer: Medicare Other

## 2011-05-14 ENCOUNTER — Ambulatory Visit: Payer: Medicare Other

## 2011-05-19 ENCOUNTER — Ambulatory Visit: Payer: Medicare Other

## 2011-05-26 ENCOUNTER — Ambulatory Visit: Payer: Medicare Other | Attending: Internal Medicine

## 2011-05-26 DIAGNOSIS — M25569 Pain in unspecified knee: Secondary | ICD-10-CM | POA: Insufficient documentation

## 2011-05-26 DIAGNOSIS — IMO0001 Reserved for inherently not codable concepts without codable children: Secondary | ICD-10-CM | POA: Insufficient documentation

## 2011-05-26 DIAGNOSIS — M6281 Muscle weakness (generalized): Secondary | ICD-10-CM | POA: Insufficient documentation

## 2011-05-26 DIAGNOSIS — R262 Difficulty in walking, not elsewhere classified: Secondary | ICD-10-CM | POA: Insufficient documentation

## 2011-05-26 DIAGNOSIS — M25669 Stiffness of unspecified knee, not elsewhere classified: Secondary | ICD-10-CM | POA: Insufficient documentation

## 2011-05-28 ENCOUNTER — Ambulatory Visit: Payer: Medicare Other

## 2011-06-12 ENCOUNTER — Ambulatory Visit: Payer: Medicare Other

## 2011-06-16 ENCOUNTER — Ambulatory Visit: Payer: Medicare Other

## 2011-08-11 ENCOUNTER — Other Ambulatory Visit: Payer: Self-pay | Admitting: Nurse Practitioner

## 2011-09-25 ENCOUNTER — Emergency Department (INDEPENDENT_AMBULATORY_CARE_PROVIDER_SITE_OTHER): Payer: Medicare Other

## 2011-09-25 ENCOUNTER — Emergency Department (INDEPENDENT_AMBULATORY_CARE_PROVIDER_SITE_OTHER)
Admission: EM | Admit: 2011-09-25 | Discharge: 2011-09-25 | Disposition: A | Discharge status: Home / Self Care | Payer: Medicare Other | Source: Home / Self Care | Attending: Emergency Medicine | Admitting: Emergency Medicine

## 2011-09-25 ENCOUNTER — Encounter (HOSPITAL_COMMUNITY): Payer: Self-pay

## 2011-09-25 DIAGNOSIS — M5430 Sciatica, unspecified side: Secondary | ICD-10-CM

## 2011-09-25 MED ORDER — HYDROCODONE-ACETAMINOPHEN 5-500 MG PO TABS: 1.0000 | ORAL_TABLET | Freq: Four times a day (QID) | ORAL | Status: AC | PRN

## 2011-09-25 NOTE — ED Provider Notes (Signed)
History     CSN: [id number]  Arrival date & time 09/25/11  1458   First MD Initiated Contact with Patient 09/25/11 1545      Chief Complaint  Patient presents with  . Hip Pain    (Consider location/radiation/quality/duration/timing/severity/associated sxs/prior treatment) HPI Comments: Patient presents to urgent care this afternoon complaining of sudden onset of left hip pain that radiates towards the lateral side of her left leg all way down to her foot. She noticed this pain this morning when she got off bed. She went to bed override and remembers having no discomfort pain and have not sustained any recent falls or injuries she can recall. She does describe that perhaps about a year ago she had the same thing but on the right side and she was told by her provider that he was sciatic pain. Patient denies any further symptoms such as weakness of her leg, changes in her skin, or swelling. Movement and especially raising her leg or walking on her left side putting weight on it makes the pain worse. She describes that within the last couple of years his all her bones are giving her problems. Including a recent right knee replacement that she had about a year ago.  Patient is a 78 y.o. female presenting with hip pain. The history is provided by the patient.  Hip Pain This is a new problem. The current episode started 12 to 24 hours ago. The problem occurs constantly. The problem has been gradually worsening. Pertinent negatives include no abdominal pain and no shortness of breath. The symptoms are aggravated by walking. Nothing relieves the symptoms. She has tried nothing for the symptoms. The treatment provided no relief.    Past Medical History  Diagnosis Date  . Chest heaviness Sept 8th, 2012    negative stress test in the hospital Sept 2012  . Hypertension   . GERD (gastroesophageal reflux disease)   . MVP (mitral valve prolapse)   . PVC's (premature ventricular contractions)   . Hiatal  hernia   . Anemia   . Fatty liver   . Blind loop syndrome   . Loss of weight   . Schatzki's ring   . Abscess of intestine   . Vitamin B12 deficiency   . 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 Allergy     SEASONAL  . Arthritis     KNEES/HANDS  . Cataract     BILATERAL  . Anxiety 03-26-11    concerns of pt. spouse dx. with Alzheimers, she cares for at home.    Past Surgical History  Procedure Date  . Hemicolectomy     and ileectomy Dr Rolene Course  . Rotator cuff repair 03-26-11    right arm  . Nuclear stress test Sept 2012    Normal  . Joint replacement     R knee  . Knee closed reduction 01/22/2011    Procedure: CLOSED MANIPULATION KNEE;  Surgeon: Kathryne Hitch;  Location: [facility] OR;  Service: Orthopedics;  Laterality: Right;  Manipulation under anesthesia right knee  . Eye surgery 03-26-11    bilateral cataract  . I&d knee with poly exchange 04/03/2011    Procedure: IRRIGATION AND DEBRIDEMENT KNEE WITH POLY EXCHANGE;  Surgeon: Kathryne Hitch, MD;  Location: [facility] ORS;  Service: Orthopedics;  Laterality: Right;  . Synovectomy 04/03/2011    Procedure: SYNOVECTOMY;  Surgeon: Kathryne Hitch, MD;  Location: [facility] ORS;  Service: Orthopedics;  Laterality: Right;    Family History  Problem Relation Age of Onset  . Hypertension Sister   . Diabetes Sister   . Diabetes Brother   . Coronary artery disease Brother   . Deep vein thrombosis Sister     History  Substance Use Topics  . Smoking status: Never Smoker   . Smokeless tobacco: Never Used  . Alcohol Use: No    OB History    Grav Para Term Preterm Abortions TAB SAB Ect Mult Living                  Review of Systems  Constitutional: Negative for fever, chills, activity change and fatigue.  Respiratory: Negative for shortness of breath.   Gastrointestinal: Negative for vomiting and abdominal pain.  Genitourinary: Negative for dysuria, urgency, difficulty urinating, pelvic pain and dyspareunia.    Musculoskeletal: Positive for gait problem. Negative for back pain, joint swelling and arthralgias.  Skin: Negative for color change, rash and wound.  Neurological: Positive for numbness. Negative for weakness.    Allergies  Review of patient's allergies indicates no known allergies.  Home Medications   Current Outpatient Rx  Name Route Sig Dispense Refill  . VITAMIN D 1000 UNITS PO TABS Oral Take 1,000 Units by mouth daily.    Marland Kitchen DEXILANT 60 MG PO CPDR  TAKE 1 CAPSULE BY MOUTH DAILY 30 MINUTES PRIOR TO BREAKFAST 30 capsule 3  . OMEGA-3 FATTY ACIDS 1000 MG PO CAPS Oral Take 3 g by mouth daily.     Marland Kitchen ALIGN 4 MG PO CAPS Oral Take 4 mg by mouth daily.     Marland Kitchen RAMIPRIL 10 MG PO TABS Oral Take 10 mg by mouth 2 (two) times daily.      Marland Kitchen RIVAROXABAN 10 MG PO TABS Oral Take 1 tablet (10 mg total) by mouth daily with breakfast. 12 tablet 0  . VITAMIN C 500 MG PO TABS Oral Take 500 mg by mouth daily.      Marland Kitchen VITAMIN D (ERGOCALCIFEROL) 50000 UNITS PO CAPS Oral Take 50,000 Units by mouth every 7 (seven) days. Friday     . ACETAMINOPHEN 500 MG PO TABS Oral Take 1,000 mg by mouth every 6 (six) hours as needed. Pain    . ALPRAZOLAM 0.25 MG PO TABS Oral Take 0.25 mg by mouth 4 (four) times daily as needed. ANXIETY/ SPASM    . FEXOFENADINE HCL 180 MG PO TABS Oral Take 180 mg by mouth daily as needed. ALLERGIES    . LORATADINE 10 MG PO TABS Oral Take 10 mg by mouth daily as needed. ALLERGIES      BP 160/78  Pulse 68  Temp 98.2 F (36.8 C) (Oral)  Resp 20  SpO2 100%  Physical Exam  Nursing note and vitals reviewed. Constitutional: She appears well-developed and well-nourished.  Musculoskeletal: She exhibits tenderness.       Legs: Neurological: She is alert.  Skin: No rash noted. No erythema.    ED Course  Procedures (including critical care time)  Labs Reviewed - No data to display No results found.   No diagnosis found.    MDM  Patient is exhibiting pain consistent with a sciatic  pattern. With no neuromuscular deficits. Given that the patient was having discomfort and exacerbated pain with weight-bearing activity pointing towards the left upper lateral aspect of her thigh we are ruling out hip conditions. I anticipate discharging the patient on some type of pain management region with followup with her primary care Dr./vs orthopedic provider.  Jimmie Molly, MD 09/25/11 1700

## 2011-09-25 NOTE — ED Notes (Signed)
States she had onset of pain in her left hip into her left leg this AM on getting out of bed. Was reportedly all right at bedtime last night. Denies injury or fall. Hist of sciatica, but states this does not feel the same; denies incontinence

## 2011-11-03 ENCOUNTER — Ambulatory Visit: Payer: Medicare Other | Admitting: Gastroenterology

## 2011-11-06 ENCOUNTER — Encounter: Payer: Self-pay | Admitting: *Deleted

## 2011-11-10 ENCOUNTER — Encounter: Payer: Self-pay | Admitting: Gastroenterology

## 2011-11-10 ENCOUNTER — Other Ambulatory Visit (INDEPENDENT_AMBULATORY_CARE_PROVIDER_SITE_OTHER): Payer: Medicare Other

## 2011-11-10 ENCOUNTER — Ambulatory Visit (INDEPENDENT_AMBULATORY_CARE_PROVIDER_SITE_OTHER): Payer: Medicare Other | Admitting: Gastroenterology

## 2011-11-10 VITALS — BP 120/74 | HR 96 | Ht 64.5 in | Wt 149.1 lb

## 2011-11-10 DIAGNOSIS — Z9049 Acquired absence of other specified parts of digestive tract: Secondary | ICD-10-CM

## 2011-11-10 DIAGNOSIS — D649 Anemia, unspecified: Secondary | ICD-10-CM

## 2011-11-10 DIAGNOSIS — Z9889 Other specified postprocedural states: Secondary | ICD-10-CM

## 2011-11-10 DIAGNOSIS — K6389 Other specified diseases of intestine: Secondary | ICD-10-CM

## 2011-11-10 DIAGNOSIS — Z79899 Other long term (current) drug therapy: Secondary | ICD-10-CM

## 2011-11-10 LAB — CBC WITH DIFFERENTIAL/PLATELET
Basophils Relative: 0.3 % (ref 0.0–3.0)
Hemoglobin: 12.1 g/dL (ref 12.0–15.0)
Lymphocytes Relative: 20.3 % (ref 12.0–46.0)
MCHC: 31.8 g/dL (ref 30.0–36.0)
Monocytes Relative: 8.3 % (ref 3.0–12.0)
Neutro Abs: 3.3 10*3/uL (ref 1.4–7.7)
RBC: 4.47 Mil/uL (ref 3.87–5.11)

## 2011-11-10 LAB — IBC PANEL
Iron: 110 ug/dL (ref 42–145)
Saturation Ratios: 29.6 % (ref 20.0–50.0)
Transferrin: 265.8 mg/dL (ref 212.0–360.0)

## 2011-11-10 MED ORDER — RIFAXIMIN 550 MG PO TABS: 550.0000 mg | ORAL_TABLET | Freq: Two times a day (BID) | ORAL | Status: DC

## 2011-11-10 NOTE — Progress Notes (Signed)
This is a complicated 78 year old African American female status post right hemicolectomy years ago, actually 1982 ,because of an abdominal abscess. She has had recurrent bacterial overgrowth syndrome on multiple occasions with associated diarrhea and abdominal cramping, this occurs approximately once a year, and is response to Xifaxan therapy. She currently has recurrence of her usual symptomatology of loose stooling with gas and bloating but no of abdominal pain, melena, or hematochezia. She seems to follow a gluten-free diet although small bowel biopsy has never showed evidence of celiac disease. The patient denies anorexia, weight loss, other food intolerances. She currently is on probiotic therapy. Her recent care is been complicated by problems related to orthopedic knee replacements. Review of her labs shows rather severe anemia with a hemoglobin of 8 and 9. She is not on iron replacement therapy at this time. She is up-to-date on her endoscopy and colonoscopy exams.  Current Medications, Allergies, Past Medical History, Past Surgical History, Family History and Social History were reviewed in [employer] record.  Pertinent Review of Systems Negative   Physical Exam: Blood pressure 120/74, pulse 96 and regular, and weight 149 pounds with BMI 25.2. Her abdomen shows no organomegaly, masses, or tenderness, bowel sounds are normal. I cannot appreciate stigmata of chronic liver disease. Her chest is clear and she appears to be in a regular rhythm without murmurs gallops or rubs.    Assessment and Plan: Probable recurrence of her bacterial overgrowth syndrome related to her previous abdominal surgery. I placed her on Xifaxan 550 mg twice a day for 2 weeks with continue probiotic therapy. I did order repeat CBC and anemia profile. She is followed medically by Dr. Creola Corn. Encounter Diagnosis  Name Primary?  Marland Kitchen Anemia Yes

## 2011-11-10 NOTE — Patient Instructions (Addendum)
We have sent the following medications to your pharmacy for you to pick up at your convenience: Xifaxan 550 mg twice daily x 14 days. Please call us back with an update on your condition in 2 weeks. Your physician has requested that you go to the basement for the following lab work before leaving today: Anemia Panel, CBC CC: Dr Creola Corn

## 2011-11-13 LAB — FERRITIN: Ferritin: 42.1 ng/mL (ref 10.0–291.0)

## 2011-11-13 LAB — FOLATE: Folate: 17.2 ng/mL (ref >5.9)

## 2011-11-13 LAB — VITAMIN B12: Vitamin B-12: 313 pg/mL (ref 211–911)

## 2011-12-10 ENCOUNTER — Other Ambulatory Visit: Payer: Self-pay | Admitting: Nurse Practitioner

## 2012-01-21 ENCOUNTER — Telehealth: Payer: Self-pay | Admitting: Gastroenterology

## 2012-01-21 NOTE — Telephone Encounter (Signed)
Pt with hx of R Hemicolectomy for abdominal abscess, recurrent Bacterial Overgrowth Syndrome, numerous EGDs with dilations. Last OV 11/10/11 for anorexia, constipation alternating with diarrhea, bloating and weight loss; she was tx with Xifaxan. Last COLON 08/27/2006 that was normal. Today, pt states she woke up with lower abdominal pain below her navel more toward the left side. She denies diarrhea; took Exlax thinking she will feel better. Please advise. Thanks.

## 2012-01-22 NOTE — Telephone Encounter (Signed)
I cannot advise in a situation as this..If I am not in ,,it needs to go to PA or doc of day ???

## 2012-02-04 ENCOUNTER — Other Ambulatory Visit (HOSPITAL_COMMUNITY): Payer: Self-pay | Admitting: Orthopedic Surgery

## 2012-02-04 DIAGNOSIS — T84038A Mechanical loosening of other internal prosthetic joint, initial encounter: Secondary | ICD-10-CM

## 2012-02-04 DIAGNOSIS — M25561 Pain in right knee: Secondary | ICD-10-CM

## 2012-02-09 ENCOUNTER — Telehealth: Payer: Self-pay | Admitting: Gastroenterology

## 2012-02-09 NOTE — Telephone Encounter (Signed)
Please advise what we can give?

## 2012-02-11 ENCOUNTER — Encounter (HOSPITAL_COMMUNITY)
Admission: RE | Admit: 2012-02-11 | Discharge: 2012-02-11 | Disposition: A | Discharge status: Home / Self Care | Payer: Medicare Other | Source: Ambulatory Visit | Attending: Orthopedic Surgery | Admitting: Orthopedic Surgery

## 2012-02-11 DIAGNOSIS — T84038A Mechanical loosening of other internal prosthetic joint, initial encounter: Secondary | ICD-10-CM

## 2012-02-11 DIAGNOSIS — M25569 Pain in unspecified knee: Secondary | ICD-10-CM | POA: Insufficient documentation

## 2012-02-11 DIAGNOSIS — M25561 Pain in right knee: Secondary | ICD-10-CM

## 2012-02-11 DIAGNOSIS — Z96659 Presence of unspecified artificial knee joint: Secondary | ICD-10-CM | POA: Insufficient documentation

## 2012-02-11 MED ORDER — TECHNETIUM TC 99M MEDRONATE IV KIT
19.6000 | PACK | Freq: Once | INTRAVENOUS | Status: AC | PRN
  Administered 2012-02-11: 19.6 via INTRAVENOUS

## 2012-02-18 ENCOUNTER — Ambulatory Visit (INDEPENDENT_AMBULATORY_CARE_PROVIDER_SITE_OTHER): Payer: Medicare Other | Admitting: Gastroenterology

## 2012-02-18 ENCOUNTER — Encounter: Payer: Self-pay | Admitting: Gastroenterology

## 2012-02-18 VITALS — BP 100/58 | HR 88 | Ht 64.5 in | Wt 153.8 lb

## 2012-02-18 DIAGNOSIS — K6389 Other specified diseases of intestine: Secondary | ICD-10-CM

## 2012-02-18 DIAGNOSIS — Z9889 Other specified postprocedural states: Secondary | ICD-10-CM

## 2012-02-18 DIAGNOSIS — K219 Gastro-esophageal reflux disease without esophagitis: Secondary | ICD-10-CM

## 2012-02-18 DIAGNOSIS — Z9049 Acquired absence of other specified parts of digestive tract: Secondary | ICD-10-CM

## 2012-02-18 MED ORDER — RIFAXIMIN 550 MG PO TABS: ORAL_TABLET | ORAL | Status: DC

## 2012-02-18 NOTE — Progress Notes (Signed)
This is a very nice 79 year old African American female who had right hemicolectomy some 30 years ago apparently because of diverticulitis with perforation.  She's had recurrent bacterial overgrowth syndrome on and off for many years.  She is up-to-date on her endoscopy and colonoscopy exams.  She now complains of recurrent gas, bloating, and vague discomfort, and alternating diarrhea and constipation.  She complains of mild nausea but no appetite.  She has chronic GERD and is on Dexilant 60 mg a day, and for hypertension Altace 10 mg a day.  She denies current upper GI or hepatobiliary complaints.  There also were no complaints of melena, hematochezia, fever, chills, or systemic complaints.  Current Medications, Allergies, Past Medical History, Past Surgical History, Family History and Social History were reviewed in [employer] record.  ROS: All systems were reviewed and are negative unless otherwise stated in the HPI.          Physical Exam: Healthy-appearing patient in no distress.  I cannot appreciate stigmata of chronic liver disease.  Blood pressure 100/58, pulse 88 and regular, and weight 153 with a BMI of 25.99.  Chest is clear she appear to be in a regular rhythm without murmurs gallops or rubs.  I cannot appreciate abdominal distention, organomegaly, masses or tenderness.  Bowel sounds are normal.  Mental status is normal.  Rectal exam was deferred.    Assessment and Plan: Recurrent active overgrowth syndrome related to her previous right hemicolectomy.  I have restarted Xifaxan 550 mg twice a day for 2 weeks with Align daily probiotic therapy.  She is up-to-date on her labs with normal CBC his fall.  She is continue other medications as per primary care.  If this above mentioned therapy does not take care of her problems, she will call and we will do further GI evaluation. No diagnosis found.

## 2012-02-18 NOTE — Patient Instructions (Addendum)
We have sent the following medications to your pharmacy for you to pick up at your convenience: Xifaxan, please take one capsule by mouth twice daily for two weeks.  Please continue Align.  Please follow up in one year or call if not better.  Cc: Creola Corn, MD

## 2012-03-01 NOTE — Telephone Encounter (Signed)
Pt seen in office.  Will close encounter

## 2012-03-07 ENCOUNTER — Other Ambulatory Visit: Payer: Self-pay | Admitting: Gastroenterology

## 2012-03-07 NOTE — Telephone Encounter (Signed)
Assessment and Plan: Recurrent active overgrowth syndrome related to her previous right hemicolectomy. I have restarted Xifaxan 550 mg twice a day for 2 weeks with Align daily probiotic therapy. She is up-to-date on her labs with normal CBC his fall. She is continue other medications as per primary care. If this above mentioned therapy does not take care of her problems, she will call and we will do further GI evaluation.  No diagnosis found. __________________________________________________________________________________________________________________  I called patient because per Dr. Norval Gable office note [phone] patient is only suppose to do Xifaxan for two weeks and then stop.  I explained that to the patient what Dr. Norval Gable assessment and plan was. I asked patient if she was not any better.  Patient states that she does not feel well and cannot eat. She at this point is forcing herself to eat. I told patient I will send information to [facility] Dr. Norval Gable nurse and patient will get a call back.

## 2012-03-07 NOTE — Telephone Encounter (Signed)
Assessment and Plan: Recurrent active overgrowth syndrome related to her previous right hemicolectomy. I have restarted Xifaxan 550 mg twice a day for 2 weeks with Align daily probiotic therapy. She is up-to-date on her labs with normal CBC his fall. She is continue other medications as per primary care. If this above mentioned therapy does not take care of her problems, she will call and we will do further GI evaluation.  No diagnosis found.  __________________________________________________________________________________________________________________  I called patient because per Dr. Norval Gable office note [phone] patient is only suppose to do Xifaxan for two weeks and then stop.  I explained that to the patient what Dr. Norval Gable assessment and plan was. I asked patient if she was not any better.  Patient states that she does not feel well and cannot eat. She at this point is forcing herself to eat. I told patient I will send information to [facility] Dr. Norval Gable nurse and patient will get a call back.  Spoke with pt who stated she asked for a refill because she's not any better; still can't and has to force herself. She stated she felt better for a few days and now she's back to the same old symptoms. Please advise. Thanks.

## 2012-04-12 ENCOUNTER — Other Ambulatory Visit: Payer: Self-pay | Admitting: Nurse Practitioner

## 2012-06-15 ENCOUNTER — Telehealth: Payer: Self-pay | Admitting: Gastroenterology

## 2012-06-15 NOTE — Telephone Encounter (Signed)
Pt reports she hasn't had a BM in 4 days which is very unusual for her. Yesterday she took [employer], stool softeners, and exlax. She ate spinach, carrots, potatoes and a [place] burger; had a lot of cramping. This am she again took several stool softeners, exlax, miralax, she had a few hard balls, but nothing else. She can't even pass gas which is unusual for her she states she's always passing gas. Please advise. Thanks.

## 2012-06-15 NOTE — Telephone Encounter (Signed)
Chronic problem.. Miralax qhs

## 2012-06-16 NOTE — Telephone Encounter (Signed)
Pt reports she had a good BM this am. Informed her Dr Jarold Motto, stated she is taking too many laxatives and to use Miralax nightly. Pt stated understanding.

## 2012-06-29 NOTE — Progress Notes (Signed)
Need orders when able please - pt coming for preop TUES 07/05/12 - thank you

## 2012-07-01 ENCOUNTER — Encounter (HOSPITAL_COMMUNITY): Payer: Self-pay | Admitting: Pharmacy Technician

## 2012-07-05 ENCOUNTER — Encounter (HOSPITAL_COMMUNITY): Payer: Self-pay

## 2012-07-05 ENCOUNTER — Encounter (HOSPITAL_COMMUNITY)
Admission: RE | Admit: 2012-07-05 | Discharge: 2012-07-05 | Disposition: A | Discharge status: Home / Self Care | Payer: Medicare Other | Source: Ambulatory Visit | Attending: Orthopedic Surgery | Admitting: Orthopedic Surgery

## 2012-07-05 ENCOUNTER — Other Ambulatory Visit: Payer: Self-pay

## 2012-07-05 ENCOUNTER — Ambulatory Visit (HOSPITAL_COMMUNITY)
Admission: RE | Admit: 2012-07-05 | Discharge: 2012-07-05 | Disposition: A | Discharge status: Home / Self Care | Payer: Medicare Other | Source: Ambulatory Visit | Attending: Orthopedic Surgery | Admitting: Orthopedic Surgery

## 2012-07-05 DIAGNOSIS — Z01810 Encounter for preprocedural cardiovascular examination: Secondary | ICD-10-CM | POA: Insufficient documentation

## 2012-07-05 DIAGNOSIS — Z01818 Encounter for other preprocedural examination: Secondary | ICD-10-CM | POA: Insufficient documentation

## 2012-07-05 LAB — BASIC METABOLIC PANEL
Calcium: 9.8 mg/dL (ref 8.4–10.5)
GFR calc Af Amer: 64 mL/min — ABNORMAL LOW (ref >90)
GFR calc non Af Amer: 55 mL/min — ABNORMAL LOW (ref >90)
Glucose, Bld: 88 mg/dL (ref 70–99)
Potassium: 5.1 mEq/L (ref 3.5–5.1)
Sodium: 134 mEq/L — ABNORMAL LOW (ref 135–145)

## 2012-07-05 LAB — PROTIME-INR
INR: 0.92 (ref 0.00–1.49)
Prothrombin Time: 12.3 seconds (ref 11.6–15.2)

## 2012-07-05 LAB — SURGICAL PCR SCREEN
MRSA, PCR: NEGATIVE
Staphylococcus aureus: NEGATIVE

## 2012-07-05 LAB — APTT: aPTT: 28 seconds (ref 24–37)

## 2012-07-05 LAB — URINALYSIS, ROUTINE W REFLEX MICROSCOPIC
Nitrite: NEGATIVE
Protein, ur: NEGATIVE mg/dL
Specific Gravity, Urine: 1.012 (ref 1.005–1.030)
Urobilinogen, UA: 0.2 mg/dL (ref 0.0–1.0)

## 2012-07-05 LAB — CBC
Hemoglobin: 11.3 g/dL — ABNORMAL LOW (ref 12.0–15.0)
MCH: 27.5 pg (ref 26.0–34.0)
Platelets: 227 10*3/uL (ref 150–400)
RBC: 4.11 MIL/uL (ref 3.87–5.11)
WBC: 3.2 10*3/uL — ABNORMAL LOW (ref 4.0–10.5)

## 2012-07-05 LAB — URINE MICROSCOPIC-ADD ON

## 2012-07-05 NOTE — Progress Notes (Signed)
07/05/12 0842  OBSTRUCTIVE SLEEP APNEA  Have you ever been diagnosed with sleep apnea through a sleep study? No  Do you snore loudly (loud enough to be heard through closed doors)?  1  Do you often feel tired, fatigued, or sleepy during the daytime? 1  Has anyone observed you stop breathing during your sleep? 0  Do you have, or are you being treated for high blood pressure? 1  BMI more than 35 kg/m2? 0  Age over 78 years old? 1  Neck circumference greater than 40 cm/18 inches? 0  Gender: 0  Obstructive Sleep Apnea Score 4  Score 4 or greater  Results sent to PCP

## 2012-07-05 NOTE — Progress Notes (Signed)
UA faxed to Dr. Olin 

## 2012-07-05 NOTE — Patient Instructions (Addendum)
[name] [name]  07/05/2012                           YOUR PROCEDURE IS SCHEDULED ON:  07/14/12               PLEASE REPORT TO SHORT STAY CENTER AT : 10:30 AM               CALL THIS NUMBER IF ANY PROBLEMS THE DAY OF SURGERY :               [phone]                      REMEMBER:   Do not eat food or drink liquids AFTER MIDNIGHT  May have clear liquids UNTIL 6 HOURS BEFORE SURGERY (7:30 AM)  Clear liquids include soda, tea, black coffee, apple or grape juice, broth.  Take these medicines the morning of surgery with A SIP OF WATER: DEXILANT   Do not wear jewelry, make-up   Do not wear lotions, powders, or perfumes.   Do not shave legs or underarms 12 hrs. before surgery (men may shave face)  Do not bring valuables to the hospital.  Contacts, dentures or bridgework may not be worn into surgery.  Leave suitcase in the car. After surgery it may be brought to your room.  For patients admitted to the hospital more than one night, checkout time is 11:00                          The day of discharge.   Patients discharged the day of surgery will not be allowed to drive home                             If going home same day of surgery, must have someone stay with you first                           24 hrs at home and arrange for some one to drive you home from hospital.    Special Instructions:   Please read over the following fact sheets that you were given:               1. MRSA  INFORMATION                      2. [place] PREPARING FOR SURGERY SHEET               3. INCENTIVE SPIROMETER                                                X_____________________________________________________________________        Failure to follow these instructions may result in cancellation of your surgery

## 2012-07-06 LAB — URINE CULTURE: Colony Count: 10000

## 2012-07-08 NOTE — Progress Notes (Signed)
Received fax from office RX  Cipro called in for pt .

## 2012-07-10 NOTE — H&P (Signed)
TOTAL KNEE REVISION ADMISSION H&P  Patient is being admitted for right revision total knee arthroplasty.  Subjective:  Chief Complaint:  Right knee pain S/P right TKA  HPI: [name] [name], 79 y.o. female, has a history of pain and functional disability in the right knee(s) due to failed previous arthroplasty and patient has failed non-surgical conservative treatments for greater than 12 weeks to include NSAID's and/or analgesics, supervised PT with diminished ADL's post treatment, use of assistive devices and activity modification. The indications for the revision of the total knee arthroplasty are pain and decreased ROM. Onset of symptoms was gradual starting 3 years ago with gradually worsening course since that time.  Prior procedures on the right knee(s) include arthroplasty by Dr. Magnus Ivan.  Patient currently rates pain in the right knee(s) at 10 out of 10 with activity. There is night pain, worsening of pain with activity and weight bearing, pain that interferes with activities of daily living, pain with passive range of motion and joint swelling.  This condition presents safety issues increasing the risk of falls.  There is no current active signs of infection.   Dr. Charlann Boxer discussed the limitations and the limitations with data to support knee revision surgery with a less than obvious source of her stiffness. She nonetheless is ready to proceed with surgery hoping to get some improvement in her pain and stiffness from this type of procedure. Standard risks of infection, deep venous thrombosis, component failure but most importantly persistent pain and recurrence of stiffness were discussed and reviewed at length.  D/C Plans:   SNF  Post-op Meds:     No Rx given  Tranexamic Acid:   To be given  Decadron:    To be given  FYI:    ASA post-op  Patient Active Problem List   Diagnosis Date Noted  . Ankylosis of knee joint 04/03/2011  . Fibrosis of knee joint 01/22/2011  . Chest pain  10/20/2010  . Bacterial overgrowth syndrome 10/09/2010  . Status post right hemicolectomy 10/09/2010  . Esophageal dysphagia 10/09/2010  . B12 deficiency 10/09/2010  . CELIAC SPRUE 01/23/2010  . DYSPHAGIA UNSPECIFIED 01/23/2010  . BLIND LOOP SYNDROME 08/26/2009  . GASTRITIS 12/26/2008  . WEIGHT LOSS-ABNORMAL 12/25/2008  . DYSPHAGIA 12/25/2008  . DIARRHEA 07/17/2008  . HOARSENESS 07/26/2007  . B12 DEFICIENCY 07/25/2007  . HYPERTENSION 07/25/2007  . MITRAL VALVE PROLAPSE 07/25/2007  . PREMATURE VENTRICULAR CONTRACTIONS 07/25/2007  . ESOPHAGITIS, REFLUX 07/25/2007  . GERD 07/25/2007  . GASTRITIS, CHRONIC 07/25/2007  . HIATAL HERNIA 07/25/2007  . ABSCESS OF INTESTINE 07/25/2007   Past Medical History  Diagnosis Date  . Hypertension   . GERD (gastroesophageal reflux disease)   . MVP (mitral valve prolapse)   . PVC's (premature ventricular contractions)   . Hiatal hernia   . Anemia   . Fatty liver   . Blind loop syndrome   . Schatzki's ring   . Vitamin B12 deficiency   . 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 Allergy     SEASONAL  . Arthritis     KNEES/HANDS  . Anxiety     Past Surgical History  Procedure Laterality Date  . Hemicolectomy      and ileectomy Dr Rolene Course; due to perforated viscus  . Rotator cuff repair  03-26-11    right arm  . Nuclear stress test  Sept 2012    Normal  . Replacement total knee      right  . Knee closed reduction  01/22/2011    Procedure: CLOSED MANIPULATION KNEE;  Surgeon: Kathryne Hitch;  Location: [facility] OR;  Service: Orthopedics;  Laterality: Right;  Manipulation under anesthesia right knee  . Cataract extraction, bilateral  03-26-11  . I&d knee with poly exchange  04/03/2011    Procedure: IRRIGATION AND DEBRIDEMENT KNEE WITH POLY EXCHANGE;  Surgeon: Kathryne Hitch, MD;  Location: [facility] ORS;  Service: Orthopedics;  Laterality: Right;  . Synovectomy  04/03/2011    Procedure: SYNOVECTOMY;  Surgeon: Kathryne Hitch, MD;  Location: [facility] ORS;  Service: Orthopedics;  Laterality: Right;  . Tubal ligation      No prescriptions prior to admission   No Known Allergies   History  Substance Use Topics  . Smoking status: Never Smoker   . Smokeless tobacco: Never Used  . Alcohol Use: No    Family History  Problem Relation Age of Onset  . Hypertension Sister   . Diabetes Sister   . Diabetes Brother   . Coronary artery disease Brother   . Deep vein thrombosis Sister   . Colon cancer Neg Hx   . Colon polyps Neg Hx      Review of Systems  Constitutional: Negative.   HENT: Negative.   Eyes: Negative.   Respiratory: Negative.   Cardiovascular: Negative.   Gastrointestinal: Positive for heartburn.  Genitourinary: Negative.   Musculoskeletal: Positive for joint pain.  Skin: Negative.   Neurological: Negative.   Endo/Heme/Allergies: Negative.   Psychiatric/Behavioral: Negative.      Objective:  Physical Exam  Constitutional: She is oriented to person, place, and time. She appears well-developed and well-nourished.  HENT:  Head: Normocephalic and atraumatic.  Mouth/Throat: Oropharynx is clear and moist.  Eyes: Pupils are equal, round, and reactive to light.  Neck: Neck supple. No JVD present. No tracheal deviation present. No thyromegaly present.  Cardiovascular: Normal rate, regular rhythm, normal heart sounds and intact distal pulses.   Respiratory: Effort normal and breath sounds normal. No stridor. No respiratory distress. She has no wheezes.  GI: Soft. There is no tenderness. There is no guarding.  Musculoskeletal:       Right knee: She exhibits decreased range of motion, swelling, laceration (healed from previous surgery) and bony tenderness. She exhibits no effusion, no ecchymosis, no deformity and no erythema. Tenderness found.  Lymphadenopathy:    She has no cervical adenopathy.  Neurological: She is alert and oriented to person, place, and time.  Skin: Skin is warm and dry.   Psychiatric: She has a normal mood and affect.    Labs:  Estimated body mass index is 26.00 kg/(m^2) as calculated from the following:   Height as of 02/18/12: 5' 4.5" (1.638 m).   Weight as of 02/18/12: 69.763 kg (153 lb 12.8 oz).  Imaging Review Plain radiographs demonstrate previous total knee arthroplasty of the right knee(s). The overall alignment is neutral. The bone quality appears to be good for age and reported activity level.   Assessment/Plan:   Failed previous arthroplasty, right knee.   The patient history, physical examination, clinical judgment of the provider and imaging studies are consistent with end stage degenerative joint disease of the right knee(s), previous total knee arthroplasty. Revision total knee arthroplasty is deemed medically necessary. The treatment options including medical management, injection therapy, arthroscopy and revision arthroplasty were discussed at length. The risks and benefits of revision total knee arthroplasty were presented and reviewed. The risks due to aseptic loosening, infection, stiffness, patella tracking problems, thromboembolic complications and other imponderables  were discussed. The patient acknowledged the explanation, agreed to proceed with the plan and consent was signed. Patient is being admitted for inpatient treatment for surgery, pain control, PT, OT, prophylactic antibiotics, VTE prophylaxis, progressive ambulation and ADL's and discharge planning.The patient is planning to be discharged to skilled nursing facility.   Anastasio Auerbach Orian Figueira   PAC  07/10/2012, 3:57 PM

## 2012-07-14 ENCOUNTER — Inpatient Hospital Stay (HOSPITAL_COMMUNITY)
Admission: RE | Admit: 2012-07-14 | Discharge: 2012-07-16 | DRG: 467 | Disposition: A | Discharge status: Skilled Nursing Facility | Payer: Medicare Other | Source: Ambulatory Visit | Attending: Orthopedic Surgery | Admitting: Orthopedic Surgery

## 2012-07-14 ENCOUNTER — Encounter (HOSPITAL_COMMUNITY): Payer: Self-pay | Admitting: *Deleted

## 2012-07-14 ENCOUNTER — Encounter (HOSPITAL_COMMUNITY)
Admission: RE | Disposition: A | Discharge status: Skilled Nursing Facility | Payer: Self-pay | Source: Ambulatory Visit | Attending: Orthopedic Surgery

## 2012-07-14 ENCOUNTER — Inpatient Hospital Stay (HOSPITAL_COMMUNITY): Payer: Medicare Other | Admitting: Anesthesiology

## 2012-07-14 ENCOUNTER — Encounter (HOSPITAL_COMMUNITY): Payer: Self-pay | Admitting: Anesthesiology

## 2012-07-14 DIAGNOSIS — Z96659 Presence of unspecified artificial knee joint: Secondary | ICD-10-CM

## 2012-07-14 DIAGNOSIS — E663 Overweight: Secondary | ICD-10-CM

## 2012-07-14 DIAGNOSIS — I10 Essential (primary) hypertension: Secondary | ICD-10-CM | POA: Diagnosis present

## 2012-07-14 DIAGNOSIS — T8489XA Other specified complication of internal orthopedic prosthetic devices, implants and grafts, initial encounter: Principal | ICD-10-CM | POA: Diagnosis present

## 2012-07-14 DIAGNOSIS — Y831 Surgical operation with implant of artificial internal device as the cause of abnormal reaction of the patient, or of later complication, without mention of misadventure at the time of the procedure: Secondary | ICD-10-CM | POA: Diagnosis present

## 2012-07-14 DIAGNOSIS — D62 Acute posthemorrhagic anemia: Secondary | ICD-10-CM | POA: Diagnosis not present

## 2012-07-14 DIAGNOSIS — Z96651 Presence of right artificial knee joint: Secondary | ICD-10-CM

## 2012-07-14 DIAGNOSIS — Z6825 Body mass index (BMI) 25.0-25.9, adult: Secondary | ICD-10-CM

## 2012-07-14 DIAGNOSIS — D500 Iron deficiency anemia secondary to blood loss (chronic): Secondary | ICD-10-CM

## 2012-07-14 HISTORY — PX: TOTAL KNEE REVISION: SHX996

## 2012-07-14 LAB — TYPE AND SCREEN

## 2012-07-14 SURGERY — TOTAL KNEE REVISION
Anesthesia: Spinal | Site: Knee | Laterality: Right | Wound class: Clean

## 2012-07-14 MED ORDER — CEFAZOLIN SODIUM-DEXTROSE 2-3 GM-% IV SOLR
2.0000 g | Freq: Four times a day (QID) | INTRAVENOUS | Status: AC
  Administered 2012-07-14 – 2012-07-15 (×2): 2 g via INTRAVENOUS
  Filled 2012-07-14 (×2): qty 50

## 2012-07-14 MED ORDER — BUPIVACAINE-EPINEPHRINE PF 0.25-1:200000 % IJ SOLN
INTRAMUSCULAR | Status: DC | PRN
  Administered 2012-07-14: 25 mL

## 2012-07-14 MED ORDER — SODIUM CHLORIDE 0.9 % IR SOLN
Status: DC | PRN
  Administered 2012-07-14: 1000 mL

## 2012-07-14 MED ORDER — HYDROMORPHONE HCL PF 1 MG/ML IJ SOLN: 0.5000 mg | INTRAMUSCULAR | Status: DC | PRN

## 2012-07-14 MED ORDER — METOCLOPRAMIDE HCL 5 MG/ML IJ SOLN: 5.0000 mg | Freq: Three times a day (TID) | INTRAMUSCULAR | Status: DC | PRN

## 2012-07-14 MED ORDER — ASPIRIN EC 325 MG PO TBEC
325.0000 mg | DELAYED_RELEASE_TABLET | Freq: Two times a day (BID) | ORAL | Status: DC
  Administered 2012-07-15 – 2012-07-16 (×3): 325 mg via ORAL
  Filled 2012-07-14 (×6): qty 1

## 2012-07-14 MED ORDER — ALPRAZOLAM 0.25 MG PO TABS
0.2500 mg | ORAL_TABLET | Freq: Four times a day (QID) | ORAL | Status: DC | PRN
  Administered 2012-07-15: 0.25 mg via ORAL
  Filled 2012-07-14: qty 1

## 2012-07-14 MED ORDER — TRANEXAMIC ACID 100 MG/ML IV SOLN
1000.0000 mg | Freq: Once | INTRAVENOUS | Status: AC
  Administered 2012-07-14: 1000 mg via INTRAVENOUS
  Filled 2012-07-14: qty 10

## 2012-07-14 MED ORDER — ONDANSETRON HCL 4 MG/2ML IJ SOLN
INTRAMUSCULAR | Status: DC | PRN
  Administered 2012-07-14: 4 mg via INTRAVENOUS

## 2012-07-14 MED ORDER — DIPHENHYDRAMINE HCL 25 MG PO CAPS: 25.0000 mg | ORAL_CAPSULE | Freq: Four times a day (QID) | ORAL | Status: DC | PRN

## 2012-07-14 MED ORDER — METHOCARBAMOL 500 MG PO TABS
500.0000 mg | ORAL_TABLET | Freq: Four times a day (QID) | ORAL | Status: DC | PRN
  Administered 2012-07-14 – 2012-07-16 (×5): 500 mg via ORAL
  Filled 2012-07-14 (×5): qty 1

## 2012-07-14 MED ORDER — SODIUM CHLORIDE 0.9 % IJ SOLN
INTRAMUSCULAR | Status: DC | PRN
  Administered 2012-07-14: 14 mL

## 2012-07-14 MED ORDER — MENTHOL 3 MG MT LOZG: 1.0000 | LOZENGE | OROMUCOSAL | Status: DC | PRN

## 2012-07-14 MED ORDER — BUPIVACAINE LIPOSOME 1.3 % IJ SUSP
20.0000 mL | Freq: Once | INTRAMUSCULAR | Status: DC
  Filled 2012-07-14: qty 20

## 2012-07-14 MED ORDER — PHENOL 1.4 % MT LIQD: 1.0000 | OROMUCOSAL | Status: DC | PRN

## 2012-07-14 MED ORDER — PROMETHAZINE HCL 25 MG/ML IJ SOLN: 6.2500 mg | INTRAMUSCULAR | Status: DC | PRN

## 2012-07-14 MED ORDER — FENTANYL CITRATE 0.05 MG/ML IJ SOLN: 25.0000 ug | INTRAMUSCULAR | Status: DC | PRN

## 2012-07-14 MED ORDER — CHLORHEXIDINE GLUCONATE 4 % EX LIQD
60.0000 mL | Freq: Once | CUTANEOUS | Status: DC
  Filled 2012-07-14: qty 60

## 2012-07-14 MED ORDER — ONDANSETRON HCL 4 MG/2ML IJ SOLN: 4.0000 mg | Freq: Four times a day (QID) | INTRAMUSCULAR | Status: DC | PRN

## 2012-07-14 MED ORDER — PROPOFOL INFUSION 10 MG/ML OPTIME
INTRAVENOUS | Status: DC | PRN
  Administered 2012-07-14: 75 ug/kg/min via INTRAVENOUS

## 2012-07-14 MED ORDER — MEPERIDINE HCL 50 MG/ML IJ SOLN: 6.2500 mg | INTRAMUSCULAR | Status: DC | PRN

## 2012-07-14 MED ORDER — ZOLPIDEM TARTRATE 5 MG PO TABS: 5.0000 mg | ORAL_TABLET | Freq: Every evening | ORAL | Status: DC | PRN

## 2012-07-14 MED ORDER — BUPIVACAINE LIPOSOME 1.3 % IJ SUSP
INTRAMUSCULAR | Status: DC | PRN
  Administered 2012-07-14: 20 mL

## 2012-07-14 MED ORDER — DEXAMETHASONE SODIUM PHOSPHATE 10 MG/ML IJ SOLN
10.0000 mg | Freq: Once | INTRAMUSCULAR | Status: AC
  Administered 2012-07-14: 10 mg via INTRAVENOUS

## 2012-07-14 MED ORDER — PROPOFOL 10 MG/ML IV EMUL
INTRAVENOUS | Status: DC | PRN
  Administered 2012-07-14: 30 mg via INTRAVENOUS

## 2012-07-14 MED ORDER — KETOROLAC TROMETHAMINE 30 MG/ML IJ SOLN
INTRAMUSCULAR | Status: DC | PRN
  Administered 2012-07-14: 30 mg

## 2012-07-14 MED ORDER — METHOCARBAMOL 100 MG/ML IJ SOLN
500.0000 mg | Freq: Four times a day (QID) | INTRAVENOUS | Status: DC | PRN
  Filled 2012-07-14: qty 5

## 2012-07-14 MED ORDER — ALUM & MAG HYDROXIDE-SIMETH 200-200-20 MG/5ML PO SUSP: 30.0000 mL | ORAL | Status: DC | PRN

## 2012-07-14 MED ORDER — POLYETHYLENE GLYCOL 3350 17 G PO PACK
17.0000 g | PACK | Freq: Two times a day (BID) | ORAL | Status: DC
  Administered 2012-07-15 – 2012-07-16 (×3): 17 g via ORAL

## 2012-07-14 MED ORDER — CEFAZOLIN SODIUM-DEXTROSE 2-3 GM-% IV SOLR
2.0000 g | INTRAVENOUS | Status: AC
  Administered 2012-07-14: 2 g via INTRAVENOUS

## 2012-07-14 MED ORDER — DOCUSATE SODIUM 100 MG PO CAPS
100.0000 mg | ORAL_CAPSULE | Freq: Two times a day (BID) | ORAL | Status: DC
  Administered 2012-07-15 – 2012-07-16 (×3): 100 mg via ORAL

## 2012-07-14 MED ORDER — SODIUM CHLORIDE 0.9 % IV SOLN
INTRAVENOUS | Status: DC
  Administered 2012-07-14: 18:00:00 via INTRAVENOUS
  Filled 2012-07-14 (×6): qty 1000

## 2012-07-14 MED ORDER — BISACODYL 10 MG RE SUPP: 10.0000 mg | Freq: Every day | RECTAL | Status: DC | PRN

## 2012-07-14 MED ORDER — LORATADINE 10 MG PO TABS
10.0000 mg | ORAL_TABLET | Freq: Every day | ORAL | Status: DC | PRN
  Filled 2012-07-14: qty 1

## 2012-07-14 MED ORDER — DEXAMETHASONE SODIUM PHOSPHATE 10 MG/ML IJ SOLN
10.0000 mg | Freq: Once | INTRAMUSCULAR | Status: AC
  Administered 2012-07-15: 10 mg via INTRAVENOUS
  Filled 2012-07-14: qty 1

## 2012-07-14 MED ORDER — METOCLOPRAMIDE HCL 10 MG PO TABS: 5.0000 mg | ORAL_TABLET | Freq: Three times a day (TID) | ORAL | Status: DC | PRN

## 2012-07-14 MED ORDER — PANTOPRAZOLE SODIUM 40 MG PO TBEC
40.0000 mg | DELAYED_RELEASE_TABLET | Freq: Every day | ORAL | Status: DC
  Administered 2012-07-15 – 2012-07-16 (×2): 40 mg via ORAL
  Filled 2012-07-14 (×2): qty 1

## 2012-07-14 MED ORDER — FERROUS SULFATE 325 (65 FE) MG PO TABS
325.0000 mg | ORAL_TABLET | Freq: Three times a day (TID) | ORAL | Status: DC
  Administered 2012-07-14 – 2012-07-16 (×3): 325 mg via ORAL
  Filled 2012-07-14 (×8): qty 1

## 2012-07-14 MED ORDER — LACTATED RINGERS IV SOLN
INTRAVENOUS | Status: DC | PRN
  Administered 2012-07-14 (×2): via INTRAVENOUS

## 2012-07-14 MED ORDER — LACTATED RINGERS IV SOLN
INTRAVENOUS | Status: DC
  Administered 2012-07-14: 1000 mL via INTRAVENOUS

## 2012-07-14 MED ORDER — FLEET ENEMA 7-19 GM/118ML RE ENEM: 1.0000 | ENEMA | Freq: Once | RECTAL | Status: AC | PRN

## 2012-07-14 MED ORDER — ONDANSETRON HCL 4 MG PO TABS: 4.0000 mg | ORAL_TABLET | Freq: Four times a day (QID) | ORAL | Status: DC | PRN

## 2012-07-14 MED ORDER — HYDROCODONE-ACETAMINOPHEN 7.5-325 MG PO TABS
1.0000 | ORAL_TABLET | ORAL | Status: DC
  Administered 2012-07-14 – 2012-07-15 (×3): 2 via ORAL
  Administered 2012-07-15: 1 via ORAL
  Administered 2012-07-15: 2 via ORAL
  Administered 2012-07-16 (×3): 1 via ORAL
  Filled 2012-07-14 (×4): qty 2
  Filled 2012-07-14 (×2): qty 1
  Filled 2012-07-14: qty 2
  Filled 2012-07-14 (×2): qty 1

## 2012-07-14 MED ORDER — PHENYLEPHRINE HCL 10 MG/ML IJ SOLN
10.0000 mg | INTRAVENOUS | Status: DC | PRN
  Administered 2012-07-14: 10 ug/min via INTRAVENOUS

## 2012-07-14 MED ORDER — 0.9 % SODIUM CHLORIDE (POUR BTL) OPTIME
TOPICAL | Status: DC | PRN
  Administered 2012-07-14: 1000 mL

## 2012-07-14 MED ORDER — CELECOXIB 200 MG PO CAPS
200.0000 mg | ORAL_CAPSULE | Freq: Two times a day (BID) | ORAL | Status: DC
  Administered 2012-07-14 – 2012-07-16 (×4): 200 mg via ORAL
  Filled 2012-07-14 (×5): qty 1

## 2012-07-14 SURGICAL SUPPLY — 80 items
ADAPTER BOLT FEMORAL +2/-2 (Knees) ×2 IMPLANT
BAG ZIPLOCK 12X15 (MISCELLANEOUS) ×2 IMPLANT
BANDAGE ELASTIC 6 VELCRO ST LF (GAUZE/BANDAGES/DRESSINGS) ×2 IMPLANT
BANDAGE ESMARK 6X9 LF (GAUZE/BANDAGES/DRESSINGS) ×1 IMPLANT
BLADE SAW SGTL 13.0X1.19X90.0M (BLADE) ×2 IMPLANT
BLADE SAW SGTL 81X20 HD (BLADE) ×2 IMPLANT
BNDG ESMARK 6X9 LF (GAUZE/BANDAGES/DRESSINGS) ×2
BOWL SMART MIX CTS (DISPOSABLE) IMPLANT
CEMENT HV SMART SET (Cement) ×6 IMPLANT
CLOTH BEACON ORANGE TIMEOUT ST (SAFETY) ×2 IMPLANT
COMP FEM CEM RT SZ3 (Orthopedic Implant) ×2 IMPLANT
COMPONENT FEM CEM RT SZ3 (Orthopedic Implant) ×1 IMPLANT
CUFF TOURN SGL QUICK 34 (TOURNIQUET CUFF) ×1
CUFF TRNQT CYL 34X4X40X1 (TOURNIQUET CUFF) ×1 IMPLANT
DECANTER SPIKE VIAL GLASS SM (MISCELLANEOUS) ×2 IMPLANT
DERMABOND ADVANCED (GAUZE/BANDAGES/DRESSINGS) ×1
DERMABOND ADVANCED .7 DNX12 (GAUZE/BANDAGES/DRESSINGS) ×1 IMPLANT
DISTAL WEDGE PFC 4MM RIGHT (Knees) ×4 IMPLANT
DRAPE EXTREMITY T 121X128X90 (DRAPE) ×2 IMPLANT
DRAPE POUCH INSTRU U-SHP 10X18 (DRAPES) ×2 IMPLANT
DRAPE U-SHAPE 47X51 STRL (DRAPES) ×2 IMPLANT
DRSG ADAPTIC 3X8 NADH LF (GAUZE/BANDAGES/DRESSINGS) IMPLANT
DRSG AQUACEL AG ADV 3.5X10 (GAUZE/BANDAGES/DRESSINGS) IMPLANT
DRSG AQUACEL AG ADV 3.5X14 (GAUZE/BANDAGES/DRESSINGS) ×2 IMPLANT
DRSG PAD ABDOMINAL 8X10 ST (GAUZE/BANDAGES/DRESSINGS) IMPLANT
DRSG TEGADERM 4X4.75 (GAUZE/BANDAGES/DRESSINGS) ×2 IMPLANT
DURAPREP 26ML APPLICATOR (WOUND CARE) ×2 IMPLANT
ELECT REM PT RETURN 9FT ADLT (ELECTROSURGICAL) ×2
ELECTRODE REM PT RTRN 9FT ADLT (ELECTROSURGICAL) ×1 IMPLANT
EVACUATOR 1/8 PVC DRAIN (DRAIN) ×2 IMPLANT
FACESHIELD LNG OPTICON STERILE (SAFETY) ×10 IMPLANT
FEMORAL ADAPTER (Orthopedic Implant) ×2 IMPLANT
GAUZE SPONGE 2X2 8PLY STRL LF (GAUZE/BANDAGES/DRESSINGS) ×1 IMPLANT
GLOVE BIOGEL PI IND STRL 7.5 (GLOVE) ×1 IMPLANT
GLOVE BIOGEL PI IND STRL 8 (GLOVE) ×2 IMPLANT
GLOVE BIOGEL PI INDICATOR 7.5 (GLOVE) ×1
GLOVE BIOGEL PI INDICATOR 8 (GLOVE) ×2
GLOVE ECLIPSE 8.0 STRL XLNG CF (GLOVE) ×4 IMPLANT
GLOVE ORTHO TXT STRL SZ7.5 (GLOVE) ×4 IMPLANT
GOWN BRE IMP PREV XXLGXLNG (GOWN DISPOSABLE) ×4 IMPLANT
GOWN STRL NON-REIN LRG LVL3 (GOWN DISPOSABLE) ×2 IMPLANT
HANDPIECE INTERPULSE COAX TIP (DISPOSABLE) ×1
IMMOBILIZER KNEE 20 (SOFTGOODS)
IMMOBILIZER KNEE 20 THIGH 36 (SOFTGOODS) IMPLANT
KIT BASIN OR (CUSTOM PROCEDURE TRAY) ×2 IMPLANT
MANIFOLD NEPTUNE II (INSTRUMENTS) ×2 IMPLANT
MARKER SKIN DUAL TIP RULER LAB (MISCELLANEOUS) ×2 IMPLANT
NDL SAFETY ECLIPSE 18X1.5 (NEEDLE) ×2 IMPLANT
NEEDLE HYPO 18GX1.5 SHARP (NEEDLE) ×2
NS IRRIG 1000ML POUR BTL (IV SOLUTION) ×2 IMPLANT
PACK TOTAL JOINT (CUSTOM PROCEDURE TRAY) ×2 IMPLANT
PADDING CAST COTTON 6X4 STRL (CAST SUPPLIES) IMPLANT
PLATE ROT INSERT 12.5MM SIZE 3 (Plate) ×2 IMPLANT
POSITIONER SURGICAL ARM (MISCELLANEOUS) ×2 IMPLANT
RESTRICTOR CEMENT SZ 5 C-STEM (Cement) ×4 IMPLANT
SET HNDPC FAN SPRY TIP SCT (DISPOSABLE) ×1 IMPLANT
SET PAD KNEE POSITIONER (MISCELLANEOUS) ×2 IMPLANT
SPONGE GAUZE 2X2 STER 10/PKG (GAUZE/BANDAGES/DRESSINGS) ×1
SPONGE GAUZE 4X4 12PLY (GAUZE/BANDAGES/DRESSINGS) ×4 IMPLANT
SPONGE LAP 18X18 X RAY DECT (DISPOSABLE) ×2 IMPLANT
STAPLER VISISTAT 35W (STAPLE) IMPLANT
STEM TIBIA PFC 13X30MM (Stem) ×4 IMPLANT
SUCTION FRAZIER 12FR DISP (SUCTIONS) ×4 IMPLANT
SUT MNCRL AB 3-0 PS2 18 (SUTURE) ×2 IMPLANT
SUT VIC AB 1 CT1 36 (SUTURE) ×6 IMPLANT
SUT VIC AB 2-0 CT1 27 (SUTURE) ×3
SUT VIC AB 2-0 CT1 TAPERPNT 27 (SUTURE) ×3 IMPLANT
SUT VLOC 180 0 24IN GS25 (SUTURE) ×2 IMPLANT
SYR 3ML LL SCALE MARK (SYRINGE) ×2 IMPLANT
SYR 50ML LL SCALE MARK (SYRINGE) ×2 IMPLANT
TOWEL OR 17X26 10 PK STRL BLUE (TOWEL DISPOSABLE) ×4 IMPLANT
TOWER CARTRIDGE SMART MIX (DISPOSABLE) ×2 IMPLANT
TRAY FOLEY CATH 14FRSI W/METER (CATHETERS) ×2 IMPLANT
TRAY REVISION SZ 3 (Knees) ×2 IMPLANT
TRAY SLEEVE CEM ML (Knees) ×2 IMPLANT
WATER STERILE IRR 1500ML POUR (IV SOLUTION) ×4 IMPLANT
WEDGE DISTAL PFC RIGHT 4MM (Knees) ×2 IMPLANT
WEDGE SIZE 3 5MM (Knees) ×2 IMPLANT
WEDGE SZ 3 10MM (Knees) ×2 IMPLANT
WRAP KNEE MAXI GEL POST OP (GAUZE/BANDAGES/DRESSINGS) ×2 IMPLANT

## 2012-07-14 NOTE — Preoperative (Signed)
Beta Blockers   Reason not to administer Beta Blockers:Not Applicable 

## 2012-07-14 NOTE — Progress Notes (Signed)
Pt states she finished taking her antibiotic for UTI

## 2012-07-14 NOTE — Anesthesia Preprocedure Evaluation (Addendum)
Anesthesia Evaluation  Patient identified by MRN, date of birth, ID band Patient awake    Reviewed: Allergy & Precautions, H&P , NPO status , Patient's Chart, lab work & pertinent test results  Airway Mallampati: II TM Distance: >3 FB Neck ROM: Full    Dental no notable dental hx.    Pulmonary neg pulmonary ROS,  breath sounds clear to auscultation  Pulmonary exam normal       Cardiovascular hypertension, + Valvular Problems/Murmurs MVP Rhythm:Regular Rate:Normal     Neuro/Psych negative neurological ROS  negative psych ROS   GI/Hepatic Neg liver ROS, hiatal hernia,   Endo/Other  negative endocrine ROS  Renal/GU negative Renal ROS  negative genitourinary   Musculoskeletal negative musculoskeletal ROS (+)   Abdominal   Peds negative pediatric ROS (+)  Hematology negative hematology ROS (+)   Anesthesia Other Findings   Reproductive/Obstetrics negative OB ROS                           Anesthesia Physical Anesthesia Plan  ASA: II  Anesthesia Plan: Spinal   Post-op Pain Management:    Induction:   Airway Management Planned: Simple Face Mask  Additional Equipment:   Intra-op Plan:   Post-operative Plan:   Informed Consent: I have reviewed the patients History and Physical, chart, labs and discussed the procedure including the risks, benefits and alternatives for the proposed anesthesia with the patient or authorized representative who has indicated his/her understanding and acceptance.   Dental advisory given  Plan Discussed with: CRNA  Anesthesia Plan Comments:         Anesthesia Quick Evaluation

## 2012-07-14 NOTE — Brief Op Note (Signed)
07/14/2012  3:58 PM  PATIENT:  [name] [name]  79 y.o. female  PRE-OPERATIVE DIAGNOSIS:  Painful stiff right total knee, failed due clinical pain  POST-OPERATIVE DIAGNOSIS:  Painful stiff right total kne, failed due clinical paine  PROCEDURE:  Procedure(s): RIGHT TOTAL KNEE REVISION (Right)  SURGEON:  Surgeon(s) and Role:    * [name], MD - Primary  PHYSICIAN ASSISTANT: Lanney Gins, PA-C  ANESTHESIA:   spinal  EBL:  Total I/O In: 1000 [I.V.:1000] Out: 375 [Urine:375]  BLOOD ADMINISTERED:none  DRAINS: (1 medium) Hemovact drain(s) in the left knee with  Suction Open   LOCAL MEDICATIONS USED:  OTHER Exparel combination  SPECIMEN:  No Specimen  DISPOSITION OF SPECIMEN:  N/A  COUNTS:  YES  TOURNIQUET:   Total Tourniquet Time Documented: Thigh (Right) - 64 minutes Total: Thigh (Right) - 64 minutes   DICTATION: .Other Dictation: Dictation Number [id number]  PLAN OF CARE: Admit to inpatient   PATIENT DISPOSITION:  PACU - hemodynamically stable.   Delay start of Pharmacological VTE agent (>24hrs) due to surgical blood loss or risk of bleeding: no

## 2012-07-14 NOTE — Transfer of Care (Signed)
Immediate Anesthesia Transfer of Care Note  Patient: [name] [name]  Procedure(s) Performed: Procedure(s): RIGHT TOTAL KNEE REVISION (Right)  Patient Location: PACU  Anesthesia Type:MAC and Spinal  Level of Consciousness: oriented, sedated, patient cooperative and responds to stimulation  Airway & Oxygen Therapy: Patient Spontanous Breathing and Patient connected to face mask oxygen  Post-op Assessment: Report given to PACU RN and Post -op Vital signs reviewed and stable  Post vital signs: Reviewed and stable  Complications: No apparent anesthesia complications

## 2012-07-14 NOTE — Interval H&P Note (Signed)
History and Physical Interval Note:  07/14/2012 12:12 PM  [name] [name]  has presented today for surgery, with the diagnosis of Painful stiff right total knee  The various methods of treatment have been discussed with the patient and family. After consideration of risks, benefits and other options for treatment, the patient has consented to  Procedure(s): RIGHT TOTAL KNEE REVISION (Right) as a surgical intervention .  The patient's history has been reviewed, patient examined, no change in status, stable for surgery.  I have reviewed the patient's chart and labs.  Questions were answered to the patient's satisfaction.     [name]

## 2012-07-14 NOTE — Anesthesia Postprocedure Evaluation (Signed)
  Anesthesia Post-op Note  Patient: [name] [name]  Procedure(s) Performed: Procedure(s) (LRB): RIGHT TOTAL KNEE REVISION (Right)  Patient Location: PACU  Anesthesia Type: Spinal  Level of Consciousness: awake and alert   Airway and Oxygen Therapy: Patient Spontanous Breathing  Post-op Pain: mild  Post-op Assessment: Post-op Vital signs reviewed, Patient's Cardiovascular Status Stable, Respiratory Function Stable, Patent Airway and No signs of Nausea or vomiting  Last Vitals:  Filed Vitals:   07/14/12 1621  BP: 133/63  Pulse: 55  Temp: 36.4 C  Resp: 13    Post-op Vital Signs: stable   Complications: No apparent anesthesia complications

## 2012-07-15 ENCOUNTER — Encounter (HOSPITAL_COMMUNITY): Payer: Self-pay | Admitting: Orthopedic Surgery

## 2012-07-15 DIAGNOSIS — D500 Iron deficiency anemia secondary to blood loss (chronic): Secondary | ICD-10-CM

## 2012-07-15 DIAGNOSIS — E663 Overweight: Secondary | ICD-10-CM

## 2012-07-15 LAB — BASIC METABOLIC PANEL
BUN: 10 mg/dL (ref 6–23)
CO2: 21 mEq/L (ref 19–32)
Chloride: 104 mEq/L (ref 96–112)
GFR calc Af Amer: 57 mL/min — ABNORMAL LOW (ref >90)
Glucose, Bld: 113 mg/dL — ABNORMAL HIGH (ref 70–99)
Potassium: 5.2 mEq/L — ABNORMAL HIGH (ref 3.5–5.1)

## 2012-07-15 LAB — CBC
HCT: 26.8 % — ABNORMAL LOW (ref 36.0–46.0)
Hemoglobin: 8.9 g/dL — ABNORMAL LOW (ref 12.0–15.0)
RBC: 3.2 MIL/uL — ABNORMAL LOW (ref 3.87–5.11)
RDW: 13.8 % (ref 11.5–15.5)
WBC: 10.2 10*3/uL (ref 4.0–10.5)

## 2012-07-15 MED ORDER — FERROUS SULFATE 325 (65 FE) MG PO TABS: 325.0000 mg | ORAL_TABLET | Freq: Three times a day (TID) | ORAL | Status: DC

## 2012-07-15 MED ORDER — DSS 100 MG PO CAPS: 100.0000 mg | ORAL_CAPSULE | Freq: Two times a day (BID) | ORAL | Status: DC

## 2012-07-15 MED ORDER — POLYETHYLENE GLYCOL 3350 17 G PO PACK: 17.0000 g | PACK | Freq: Two times a day (BID) | ORAL | Status: DC

## 2012-07-15 MED ORDER — HYDROCODONE-ACETAMINOPHEN 7.5-325 MG PO TABS: 1.0000 | ORAL_TABLET | ORAL | Status: DC | PRN

## 2012-07-15 MED ORDER — ALPRAZOLAM 0.25 MG PO TABS: 0.2500 mg | ORAL_TABLET | Freq: Four times a day (QID) | ORAL | Status: AC | PRN

## 2012-07-15 MED ORDER — TIZANIDINE HCL 4 MG PO CAPS: 4.0000 mg | ORAL_CAPSULE | Freq: Three times a day (TID) | ORAL | Status: DC | PRN

## 2012-07-15 MED ORDER — ASPIRIN 325 MG PO TBEC: 325.0000 mg | DELAYED_RELEASE_TABLET | Freq: Two times a day (BID) | ORAL | Status: AC

## 2012-07-15 NOTE — Progress Notes (Signed)
   Subjective: 1 Day Post-Op Procedure(s) (LRB): RIGHT TOTAL KNEE REVISION (Right)   Patient reports pain as mild, pain well controlled with medication. No events throughout the night.   Objective:   VITALS:   Filed Vitals:   07/15/12 0606  BP: 102/62  Pulse: 59  Temp: 98.2 F (36.8 C)  Resp: 14    Neurovascular intact Dorsiflexion/Plantar flexion intact Incision: dressing C/D/I No cellulitis present Compartment soft  LABS  Recent Labs  07/15/12 0525  HGB 8.9*  HCT 26.8*  WBC 10.2  PLT 166     Recent Labs  07/15/12 0525  NA 131*  K 5.2*  BUN 10  CREATININE 1.03  GLUCOSE 113*     Assessment/Plan: 1 Day Post-Op Procedure(s) (LRB): RIGHT TOTAL KNEE REVISION (Right) Advance diet Up with therapy D/C IV fluids Discharge to SNF eventually, when ready  Expected ABLA  Treated with iron and will observe  Overweight (BMI 25-29.9) Estimated body mass index is 26.79 kg/(m^2) as calculated from the following:   Height as of this encounter: 5\' 5"  (1.651 m).   Weight as of this encounter: 73.029 kg (161 lb). Patient also counseled that weight may inhibit the healing process Patient counseled that losing weight will help with future health issues       Anastasio Auerbach. Sipriano Fendley   PAC  07/15/2012, 8:52 AM

## 2012-07-15 NOTE — Progress Notes (Signed)
OT Cancellation Note  Patient Details Name: [name] [name] MRN: [id number] DOB: [birth year]   Cancelled Treatment:    Reason Eval/Treat Not Completed: OT screened, no needs identified, will sign off  Faryal Marxen, [place] Kung 07/15/2012, 11:15 AM

## 2012-07-15 NOTE — Progress Notes (Signed)
Physical Therapy Treatment Patient Details Name: [name] [name] MRN: [id number] DOB: 03/26/1928 Today's Date: 07/15/2012 Time: 1355-1416 PT Time Calculation (min): 21 min  PT Assessment / Plan / Recommendation  PT Comments     Follow Up Recommendations  SNF     Does the patient have the potential to tolerate intense rehabilitation     Barriers to Discharge        Equipment Recommendations  None recommended by PT    Recommendations for Other Services OT consult  Frequency 7X/week   Progress towards PT Goals Progress towards PT goals: Progressing toward goals  Plan      Precautions / Restrictions Precautions Precautions: Fall Restrictions Weight Bearing Restrictions: No Other Position/Activity Restrictions: WBAT   Pertinent Vitals/Pain Min c/o pain    Mobility  Transfers Transfers: Sit to Stand;Stand to Sit Sit to Stand: 4: Min assist Stand to Sit: 4: Min assist Details for Transfer Assistance: cues for LE management and use of UEs to self assist Ambulation/Gait Ambulation/Gait Assistance: 4: Min assist Ambulation Distance (Feet): 140 Feet (twice) Assistive device: Rolling walker Ambulation/Gait Assistance Details: cues for posture and position from RW Gait Pattern: Step-to pattern;Step-through pattern Stairs: No    Exercises     PT Diagnosis:    PT Problem List:   PT Treatment Interventions:     PT Goals (current goals can now be found in the care plan section) Acute Rehab PT Goals Patient Stated Goal: Resume previous lifestyle with decreased pain  Visit Information  Last PT Received On: 07/15/12 Assistance Needed: +1    Subjective Data  Subjective: I feel pretty good Patient Stated Goal: Resume previous lifestyle with decreased pain   Cognition  Cognition Arousal/Alertness: Awake/alert Behavior During Therapy: WFL for tasks assessed/performed Overall Cognitive Status: Within Functional Limits for tasks assessed    Balance     End of Session  PT - End of Session Equipment Utilized During Treatment: Gait belt Activity Tolerance: Patient tolerated treatment well Patient left: in chair;with call bell/phone within reach Nurse Communication: Mobility status   GP     [name] [name] 07/15/2012, 2:26 PM

## 2012-07-15 NOTE — Progress Notes (Signed)
Utilization review completed.  

## 2012-07-15 NOTE — Op Note (Signed)
NAMESASHA, ROGEL NO.:  [id number]  MEDICAL RECORD NO.:  [id number]  LOCATION:  1615                         FACILITY:  [facility]  PHYSICIAN:  Madlyn Frankel. Charlann Boxer, M.D.  DATE OF BIRTH:  06-21-1928  DATE OF PROCEDURE:  07/14/2012 DATE OF DISCHARGE:                              OPERATIVE REPORT   PREOPERATIVE DIAGNOSIS:  Failed right total knee replacement with persistent pain complaints of stiffness and decreased functional quality of life.  Workup negative for infection.  POSTOPERATIVE DIAGNOSIS:  Failed right total knee replacement with persistent pain complaints of stiffness and decreased functional quality of life.  Workup negative for infection.  PROCEDURE:  Revision of right total knee replacement.  COMPONENTS USED:  DePuy rotating platform posterior stabilized knee system with a size 3 TC3 femur, 4 mm distal medial and lateral augments and a +2 bolt, +2 adapter, 5-degree bolt with a 13 x 30 cemented stem. A size 3 MBT revision tray with 13 x 30 cemented stem, 29 cemented sleeve, and 10 mm medial and lateral augments.  A size 12.5 posterior stabilized insert was utilized that retained in the patella button and patellar height measured 22 mm.  SURGEON:  Madlyn Frankel. Charlann Boxer, M.D.  ASSISTANT:  Lanney Gins, PAC.  Note that Mr. Carmon Sails was present for the entirety of the case and preoperative positioning, perioperative management of the operative extremity, general facilitation of the case, primary wound closure.  ANESTHESIA:  Spinal.  SPECIMENS:  None.  COMPLICATIONS:  None.  DRAINS:  One medium Hemovac.  TOURNIQUET TIME:  64 minutes at 250 mmHg.  COMPLICATIONS:  None.  INDICATIONS FOR PROCEDURE:  Ms. Spielberg is an 79 year old female, self referred for evaluation of persistent groin pain, right total knee replacement.  She had an index total-knee replacement with followup of revision surgery and manipulation, she had persistent recurring pain. We  discussed options and management including continued conservative observation, however, her persistence in discomfort and functional were reduction quality of life.  She did wish to proceed with revision surgery.  At the time of this discussion, I told her the risks of surgery including persistent pain, dysfunction and without identifying the true source of her problems, it is difficult to qualify the success results.  Despite all this, she wished to proceed in addition to standard risk of infection, DVT, component failure, need for future revision surgery.  Consent was obtained for benefit of pain relief.  PROCEDURE IN DETAIL:  The patient was brought to the operative theater. Once adequate anesthesia, preoperative antibiotics, Ancef administered. She was positioned supine with a right thigh tourniquet placed.  She also received tranexamic acid and Decadron.  The right lower extremity was then prepped and draped in sterile fashion with the right foot placed in DeMayo leg holder.  Time-out was performed identifying the patient, planned procedure, and extremity.  Right lower extremity was exsanguinated, tourniquet elevated to 250 mmHg.  The patient's old incision was excised removing the hypertrophic scar.  Soft tissue planes created.  A median arthrotomy was then made encountering clear synovial fluid.  Following initial exposure including some medial and lateral synovectomy of the suprapatellar pouch debridement, attention was directed at removing the tibial component  and the femoral component.  I used a 10 ACL saw blade to undermine the cement bone interface.  I was able to remove the components both the tibial and femoral side without significant bone loss at all.  At this point, I was able to evert the patella better using towel clips.  I debrided the scar tissue around the patella measuring the patellar height to be 22 mm.  I retained the patella due to the significant reduction in  bone to get only a 22 mm in height.  At this point, preparation of the femur and tibial carried out with hand reaming up to 15 mm to allow for a 13 mm potential cemented stem.  I then used an extramedullary guide under finding the cut on the proximal tibia.  Removed a little bit of bone laterally.  We decided to cut surface to be ready for size 3 tibial component.  We had removed 4 broach with a 29 cemented broach.  At this point, I impacted onto this cut surface now MBT trial tray without the keel to use as a reference point for rotation of femoral component.  An intramedullary rod was placed into the femur and held in position at distal aspect with the cylindrical tube.  Re-evaluated the distal femoral cut.  No bone was needed to be resected.  I then sized the femur to be a size 3 and selected the size 3 block positioning on the intramedullary rod, setting rotation off the proximal tibia cut for rotation.  Minimal bone was removed off the posterior aspects very little on the anterior aspect and the chamfers.  I cut the box for TC3 femur to improve enhanced and the cementing.  At this point, I did a trial reduction.  We had a trial 3 TC3 femur placed and the tibial tray in place, and with this even with a 20 mm insert, there is significant hyperextension.  There was hyperextension noted with some flexion stability.  I thus selected a distal 10 mm, medial and lateral tibial augment and a 4 mm distal femoral augment knowing that this would compensate for the extension laxity noted with these trials.  At this point, final preparation of tibia and femur were carried out including placed a cement restrictor.  All final components were opened.  Once they were opened and configured under direct supervision, the cement was mixed.  The final components were cemented into position.  The tibia and the femur and the knee brought to extension with a 12.5 insert with this the knee came to full  extension.  Extruded cement was removed.  Once the cement had cured, excessive cement was removed.  We selected a 12.5 posterior stabilized insert for the size 3 femur.  This was then placed. The knee had been irrigated out.  Following preparation of bone and then again at this point, I placed a medium Hemovac drain deep.  The extensor mechanism was then reapproximated using #1 Vicryl and 0 V-Loc.  The remaining wound was closed with 2-0 Vicryl and a running 3-0 Monocryl. The knee was cleaned, dried, and dressed sterilely using Dermabond and Aquacel dressing.  She was brought to the recovery room in stable condition tolerating the procedure well.  Findings will be reviewed with her in the postop period.    Madlyn Frankel Charlann Boxer, M.D.    MDO/MEDQ  D:  07/14/2012  T:  07/15/2012  Job:  [id number]

## 2012-07-15 NOTE — Evaluation (Signed)
Physical Therapy Evaluation Patient Details Name: [name] [name] MRN: [id number] DOB: 1928/02/12 Today's Date: 07/15/2012 Time: 4098-1191 PT Time Calculation (min): 28 min  PT Assessment / Plan / Recommendation History of Present Illness     Clinical Impression  Pt s/p R TKR revision presents with decreased R LE strength/ROM and post op pain limiting functional mobility.  Pt would benefit from ST-SNF level rehab prior to d/c home alone.    PT Assessment  Patient needs continued PT services    Follow Up Recommendations  SNF    Does the patient have the potential to tolerate intense rehabilitation      Barriers to Discharge        Equipment Recommendations  None recommended by PT    Recommendations for Other Services OT consult   Frequency 7X/week    Precautions / Restrictions Precautions Precautions: Fall Restrictions Weight Bearing Restrictions: No Other Position/Activity Restrictions: WBAT   Pertinent Vitals/Pain 5/10; premed, ice packs provided      Mobility  Bed Mobility Bed Mobility: Supine to Sit Supine to Sit: 4: Min assist Details for Bed Mobility Assistance: cues for sequence and use of L LE to self assist Transfers Transfers: Sit to Stand;Stand to Sit Sit to Stand: 4: Min assist Stand to Sit: 4: Min assist Details for Transfer Assistance: cues for LE management and use of UEs to self assist Ambulation/Gait Ambulation/Gait Assistance: 4: Min assist Ambulation Distance (Feet): 52 Feet Assistive device: Rolling walker Ambulation/Gait Assistance Details: cues for initial sequence, posture, stride length, and position from RW Gait Pattern: Step-to pattern    Exercises Total Joint Exercises Ankle Circles/Pumps: AROM;15 reps;Supine;Both Quad Sets: AROM;10 reps;Both;Supine Heel Slides: AAROM;10 reps;Supine;Right Straight Leg Raises: AROM;AAROM;15 reps;Supine;Right   PT Diagnosis: Difficulty walking  PT Problem List: Decreased strength;Decreased range  of motion;Decreased activity tolerance;Decreased mobility;Decreased knowledge of use of DME;Pain;Decreased knowledge of precautions PT Treatment Interventions: DME instruction;Gait training;Stair training;Functional mobility training;Therapeutic activities;Therapeutic exercise;Patient/family education     PT Goals(Current goals can be found in the care plan section) Acute Rehab PT Goals Patient Stated Goal: Resume previous lifestyle with decreased pain PT Goal Formulation: With patient Time For Goal Achievement: 07/19/12 Potential to Achieve Goals: Good  Visit Information  Last PT Received On: 07/15/12 Assistance Needed: +1       Prior Functioning  Home Living Family/patient expects to be discharged to:: Skilled nursing facility Living Arrangements: Alone Prior Function Level of Independence: Independent with assistive device(s) Communication Communication: No difficulties Dominant Hand: Right    Cognition  Cognition Arousal/Alertness: Awake/alert Behavior During Therapy: WFL for tasks assessed/performed Overall Cognitive Status: Within Functional Limits for tasks assessed    Extremity/Trunk Assessment Upper Extremity Assessment Upper Extremity Assessment: Overall WFL for tasks assessed Lower Extremity Assessment Lower Extremity Assessment: RLE deficits/detail RLE Deficits / Details: 3/5 quads with AAROM at knee -10 - 75 Cervical / Trunk Assessment Cervical / Trunk Assessment: Normal   Balance    End of Session PT - End of Session Equipment Utilized During Treatment: Gait belt Activity Tolerance: Patient tolerated treatment well Patient left: in chair;with call bell/phone within reach Nurse Communication: Mobility status  GP     [name] [name] 07/15/2012, 12:37 PM

## 2012-07-15 NOTE — Progress Notes (Signed)
Clinical Social Work Department CLINICAL SOCIAL WORK PLACEMENT NOTE 07/15/2012  Patient:  [name] [name], [name] [name]  Account Number:  [id number] Admit date:  07/14/2012  Clinical Social Worker:  Cori Razor, LCSW  Date/time:  07/15/2012 11:40 AM  Clinical Social Work is seeking post-discharge placement for this patient at the following level of care:   SKILLED NURSING   (*CSW will update this form in Epic as items are completed)   07/15/2012  Patient/family provided with [facility] Department of Clinical Social Work's list of facilities offering this level of care within the geographic area requested by the patient (or if unable, by the patient's family).  07/15/2012  Patient/family informed of their freedom to choose among providers that offer the needed level of care, that participate in Medicare, Medicaid or managed care program needed by the patient, have an available bed and are willing to accept the patient.    Patient/family informed of [facility]' ownership interest in [facility], as well as of the fact that they are under no obligation to receive care at this facility.  PASARR submitted to EDS on  PASARR number received from [employer] on 11/03/2010  FL2 transmitted to all facilities in geographic area requested by pt/family on  07/15/2012 FL2 transmitted to all facilities within larger geographic area on   Patient informed that his/her managed care company has contracts with or will negotiate with  certain facilities, including the following:     Patient/family informed of bed offers received:  07/15/2012 Patient chooses bed at [facility] Physician recommends and patient chooses bed at    Patient to be transferred to [facility] on   Patient to be transferred to facility by   The following physician request were entered in Epic:   Additional Comments:  Cori Razor LCSW [phone]

## 2012-07-15 NOTE — Discharge Summary (Signed)
Physician Discharge Summary  Patient ID: [name] [name] MRN: [id number] DOB/AGE: August 18, 1928 79 y.o.  Admit date: 07/14/2012 Tentative discharge date:  07/16/2012  Procedures:  Procedure(s) (LRB): RIGHT TOTAL KNEE REVISION (Right)  Attending Physician:  Dr. Durene Romans   Admission Diagnoses:   Right knee pain S/P right TKA  Discharge Diagnoses:  Principal Problem:   S/P right TK revision Active Problems:   Overweight (BMI 25.0-29.9)   Expected blood loss anemia  Past Medical History  Diagnosis Date  . Hypertension   . GERD (gastroesophageal reflux disease)   . MVP (mitral valve prolapse)   . PVC's (premature ventricular contractions)   . Hiatal hernia   . Anemia   . Fatty liver   . Blind loop syndrome   . Schatzki's ring   . Vitamin B12 deficiency   . 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 Allergy     SEASONAL  . Arthritis     KNEES/HANDS  . Anxiety     HPI:   [name] [name], 79 y.o. female, has a history of pain and functional disability in the right knee(s) due to failed previous arthroplasty and patient has failed non-surgical conservative treatments for greater than 12 weeks to include NSAID's and/or analgesics, supervised PT with diminished ADL's post treatment, use of assistive devices and activity modification. The indications for the revision of the total knee arthroplasty are pain and decreased ROM. Onset of symptoms was gradual starting 3 years ago with gradually worsening course since that time. Prior procedures on the right knee(s) include arthroplasty by Dr. Magnus Ivan. Patient currently rates pain in the right knee(s) at 10 out of 10 with activity. There is night pain, worsening of pain with activity and weight bearing, pain that interferes with activities of daily living, pain with passive range of motion and joint swelling. This condition presents safety issues increasing the risk of falls. There is no current active signs of infection. Dr.  Charlann Boxer discussed the limitations and the limitations with data to support knee revision surgery with a less than obvious source of her stiffness. She nonetheless is ready to proceed with surgery hoping to get some improvement in her pain and stiffness from this type of procedure. Standard risks of infection, deep venous thrombosis, component failure but most importantly persistent pain and recurrence of stiffness were discussed and reviewed at length.  PCP: Gwen Pounds, MD   Discharged Condition: good  Hospital Course:  Patient underwent the above stated procedure on 07/14/2012. Patient tolerated the procedure well and brought to the recovery room in good condition and subsequently to the floor.  Patient's labs were stable throughout her time in the hospital. She had an uneventful course while in the hospital. She progressed well with PT. It was felt that the patient was doing well enough to be discharged from the hospital to the care of a SNF.   Discharge Exam: General appearance: alert, cooperative and no distress Extremities: Homans sign is negative, no sign of DVT, no edema, redness or tenderness in the calves or thighs and no ulcers, gangrene or trophic changes  Disposition: SNF with follow up in 2 weeks   Follow-up Information   Follow up with [name], MD. Schedule an appointment as soon as possible for a visit in 2 weeks.   Contact information:   [address] [place] [address] [phone]        Future Orders Complete By Expires   Call MD / Call 911  As directed    Comments:     If you experience chest pain or shortness of breath, CALL 911 and be transported to the hospital emergency room.  If you develope a fever above 101 F, pus (white drainage) or increased drainage or redness at the wound, or calf pain, call your surgeon's office.   Change dressing  As directed    Comments:     Maintain surgical dressing for 10-14 days, then change the dressing  daily with sterile 4 x 4 inch gauze dressing and tape. Keep the area dry and clean.   Constipation Prevention  As directed    Comments:     Drink plenty of fluids.  Prune juice may be helpful.  You may use a stool softener, such as Colace (over the counter) 100 mg twice a day.  Use MiraLax (over the counter) for constipation as needed.   Diet - low sodium heart healthy  As directed    Discharge instructions  As directed    Comments:     Maintain surgical dressing for 10-14 days, then replace with gauze and tape. Keep the area dry and clean until follow up. Follow up in 2 weeks at [facility]. Call with any questions or concerns.   Driving restrictions  As directed    Comments:     No driving for 4 weeks   Increase activity slowly as tolerated  As directed    TED hose  As directed    Comments:     Use stockings (TED hose) for 2 weeks on both leg(s).  You may remove them at night for sleeping.   Weight bearing as tolerated  As directed       Medication List    STOP taking these medications       acetaminophen 500 MG tablet  Commonly known as:  TYLENOL      TAKE these medications       ALIGN 4 MG Caps  Take 4 mg by mouth daily.     ALPRAZolam 0.25 MG tablet  Commonly known as:  XANAX  Take 0.25 mg by mouth 4 (four) times daily as needed. ANXIETY/ SPASM     aspirin 325 MG EC tablet  Take 1 tablet (325 mg total) by mouth 2 (two) times daily.     cholecalciferol 1000 UNITS tablet  Commonly known as:  VITAMIN D  Take 1,000 Units by mouth daily.     dexlansoprazole 60 MG capsule  Commonly known as:  DEXILANT  Take 60 mg by mouth daily.     DSS 100 MG Caps  Take 100 mg by mouth 2 (two) times daily.     ferrous sulfate 325 (65 FE) MG tablet  Take 1 tablet (325 mg total) by mouth 3 (three) times daily after meals.     fexofenadine 180 MG tablet  Commonly known as:  ALLEGRA  Take 180 mg by mouth daily as needed. ALLERGIES     fish oil-omega-3 fatty acids 1000  MG capsule  Take 3 g by mouth daily.     HYDROcodone-acetaminophen 7.5-325 MG per tablet  Commonly known as:  NORCO  Take 1-2 tablets by mouth every 4 (four) hours as needed for pain.     loratadine 10 MG tablet  Commonly known as:  CLARITIN  Take 10 mg by mouth daily as needed. ALLERGIES     polyethylene glycol packet  Commonly known as:  MIRALAX / GLYCOLAX  Take 17 g by mouth 2 (two)  times daily.     ramipril 10 MG tablet  Commonly known as:  ALTACE  Take 10 mg by mouth 2 (two) times daily.     rifaximin 550 MG Tabs  Commonly known as:  XIFAXAN  Take 550 mg by mouth 2 (two) times daily.     tiZANidine 4 MG capsule  Commonly known as:  ZANAFLEX  Take 1 capsule (4 mg total) by mouth 3 (three) times daily as needed for muscle spasms. Muscle spasms     vitamin C 500 MG tablet  Commonly known as:  ASCORBIC ACID  Take 500 mg by mouth daily.     Vitamin D (Ergocalciferol) 50000 UNITS Caps  Commonly known as:  DRISDOL  Take 50,000 Units by mouth every 7 (seven) days. Friday           Signed: Anastasio Auerbach. Daril Warga   PAC

## 2012-07-15 NOTE — Progress Notes (Signed)
Clinical Social Work Department BRIEF PSYCHOSOCIAL ASSESSMENT 07/15/2012  Patient:  [name] [name], [name] [name]     Account Number:  [id number]     Admit date:  07/14/2012  Clinical Social Worker:  Candie Chroman  Date/Time:  07/15/2012 11:29 AM  Referred by:  Physician  Date Referred:  07/15/2012 Referred for  SNF Placement   Other Referral:   Interview type:  Patient Other interview type:    PSYCHOSOCIAL DATA Living Status:  HUSBAND Admitted from facility:   Level of care:   Primary support name:  Kennith Maes Primary support relationship to patient:  SPOUSE Degree of support available:   unclear    CURRENT CONCERNS Current Concerns  Post-Acute Placement   Other Concerns:    SOCIAL WORK ASSESSMENT / PLAN Pt is an 79 yr old female living at home prior to hospitalization. CSW met with pt to assist with d/c planning. Pt requesting ST Rehab at [facility]. SNF contacted and is able offer ST Rehab bed. MD plans to d/c pt SAT. Week end CSW will assist with d/c planning to SNF.   Assessment/plan status:  Psychosocial Support/Ongoing Assessment of Needs Other assessment/ plan:   Information/referral to community resources:   SNF list    PATIENT'S/FAMILY'S RESPONSE TO PLAN OF CARE: Pt is looking forward to rehab at [facility] .   Zandra Abts LCSW [phone]

## 2012-07-16 LAB — CBC
Hemoglobin: 7.7 g/dL — ABNORMAL LOW (ref 12.0–15.0)
MCH: 27.5 pg (ref 26.0–34.0)
Platelets: 144 10*3/uL — ABNORMAL LOW (ref 150–400)
RBC: 2.8 MIL/uL — ABNORMAL LOW (ref 3.87–5.11)
WBC: 8.8 10*3/uL (ref 4.0–10.5)

## 2012-07-16 LAB — BASIC METABOLIC PANEL
CO2: 21 mEq/L (ref 19–32)
Calcium: 8.5 mg/dL (ref 8.4–10.5)
Glucose, Bld: 130 mg/dL — ABNORMAL HIGH (ref 70–99)
Potassium: 4.8 mEq/L (ref 3.5–5.1)
Sodium: 133 mEq/L — ABNORMAL LOW (ref 135–145)

## 2012-07-16 MED ORDER — FERROUS SULFATE 325 (65 FE) MG PO TABS: 325.0000 mg | ORAL_TABLET | Freq: Three times a day (TID) | ORAL | Status: DC

## 2012-07-16 MED ORDER — TIZANIDINE HCL 4 MG PO CAPS: 4.0000 mg | ORAL_CAPSULE | Freq: Three times a day (TID) | ORAL | Status: DC | PRN

## 2012-07-16 MED ORDER — HYDROCODONE-ACETAMINOPHEN 7.5-325 MG PO TABS: 1.0000 | ORAL_TABLET | ORAL | Status: DC | PRN

## 2012-07-16 MED ORDER — POLYETHYLENE GLYCOL 3350 17 G PO PACK: 17.0000 g | PACK | Freq: Two times a day (BID) | ORAL | Status: DC

## 2012-07-16 NOTE — Progress Notes (Signed)
Physical Therapy Treatment Patient Details Name: HILJA KINTZEL MRN: [id number] DOB: [birth year] Today's Date: 07/16/2012 Time: 4098-1191 PT Time Calculation (min): 24 min  PT Assessment / Plan / Recommendation  PT Comments     Follow Up Recommendations  SNF     Does the patient have the potential to tolerate intense rehabilitation     Barriers to Discharge        Equipment Recommendations  None recommended by PT    Recommendations for Other Services OT consult  Frequency 7X/week   Progress towards PT Goals Progress towards PT goals: Progressing toward goals  Plan Current plan remains appropriate    Precautions / Restrictions Precautions Precautions: Fall Restrictions Weight Bearing Restrictions: No Other Position/Activity Restrictions: WBAT   Pertinent Vitals/Pain 6/10 with activity; premed, cold packs provided    Mobility  Bed Mobility Bed Mobility: Supine to Sit Supine to Sit: 4: Min guard Details for Bed Mobility Assistance: cues for sequence and use of L LE to self assist Transfers Transfers: Sit to Stand;Stand to Sit Sit to Stand: 4: Min guard Stand to Sit: 4: Min guard Details for Transfer Assistance: cues for LE management and use of UEs to self assist Ambulation/Gait Ambulation/Gait Assistance: 4: Min guard Ambulation Distance (Feet): 133 Feet Assistive device: Rolling walker Ambulation/Gait Assistance Details: min cues for posture and position from RW Gait Pattern: Step-through pattern Stairs: No    Exercises Total Joint Exercises Ankle Circles/Pumps: AROM;15 reps;Supine;Both Quad Sets: AROM;Both;Supine;20 reps Heel Slides: AAROM;Supine;Right;20 reps Straight Leg Raises: AROM;AAROM;Supine;Right;20 reps Long Arc Quad: AROM;AAROM;10 reps;Seated;Both Goniometric ROM: -8 - 90 AAROM at knee   PT Diagnosis:    PT Problem List:   PT Treatment Interventions:     PT Goals (current goals can now be found in the care plan section) Acute Rehab PT  Goals Patient Stated Goal: Resume previous lifestyle with decreased pain  Visit Information  Last PT Received On: 07/16/12 Assistance Needed: +1    Subjective Data  Subjective: I might be leaving today Patient Stated Goal: Resume previous lifestyle with decreased pain   Cognition  Cognition Arousal/Alertness: Awake/alert Behavior During Therapy: WFL for tasks assessed/performed Overall Cognitive Status: Within Functional Limits for tasks assessed    Balance     End of Session PT - End of Session Equipment Utilized During Treatment: Gait belt Activity Tolerance: Patient tolerated treatment well Patient left: in chair;with call bell/phone within reach Nurse Communication: Mobility status   GP     Braylee Bosher 07/16/2012, 8:47 AM

## 2012-07-16 NOTE — Progress Notes (Signed)
[name] [name]  MRN: [id number] DOB/Age: 77-14-1930 77 y.o. Physician: Jacquelyne Balint Procedure: Procedure(s) (LRB): RIGHT TOTAL KNEE REVISION (Right)     Subjective: Up and dressed, no specific c/o. Flatus but no BM yet. Tolerating pos. Denies dizzziness when out of bed  Vital Signs Temp:  [97.4 F (36.3 C)-98.3 F (36.8 C)] 98 F (36.7 C) (06/28 0620) Pulse Rate:  [67-76] 68 (06/28 0620) Resp:  [15-16] 16 (06/28 0620) BP: (114-127)/(51-61) 114/60 mmHg (06/28 0620) SpO2:  [99 %-100 %] 99 % (06/28 0620)  Lab Results  Recent Labs  07/15/12 0525 07/16/12 0450  WBC 10.2 8.8  HGB 8.9* 7.7*  HCT 26.8* 23.3*  PLT 166 144*   BMET  Recent Labs  07/15/12 0525 07/16/12 0450  NA 131* 133*  K 5.2* 4.8  CL 104 105  CO2 21 21  GLUCOSE 113* 130*  BUN 10 17  CREATININE 1.03 1.06  CALCIUM 8.4 8.5   INR  Date Value Range Status  07/05/2012 0.92  0.00 - 1.49 Final     Exam Right knee dressing dry NVI        Plan Transfer to Skilled today  [facility] for Dr.Kevin Supple 07/16/2012, 8:18 AM

## 2012-07-16 NOTE — Plan of Care (Signed)
Problem: Discharge Progression Outcomes Goal: Discharge plan in place and appropriate Outcome: Completed/Met Date Met:  07/16/12 SNF for rehab

## 2012-07-16 NOTE — Progress Notes (Signed)
Per MD, Pt ready for d/c.  Notified RN, Pt and facility.  Pt declined to have CSW notify family.  Facility ready to receive Pt, as admission was arranged yesterday by Weekday CSW.  Arranged for transportation.  Providence Crosby, LCSWA Clinical Social Work [phone]

## 2012-07-16 NOTE — Plan of Care (Signed)
Problem: Discharge Progression Outcomes Goal: Negotiates stairs Outcome: Not Met (add Reason) To rehab

## 2012-08-06 IMAGING — CR DG KNEE 1-2V*R*
1 series · 2 of 2 positions shown · non-contrast
Comparison: Portable AP and lateral views at 7743 hours without
priors for comparison

CLINICAL DATA: Right knee replacement

RIGHT KNEE - 1-2 VIEW

[Series 1: AP · right · 2 of 2 slices shown]
[im 1/2]
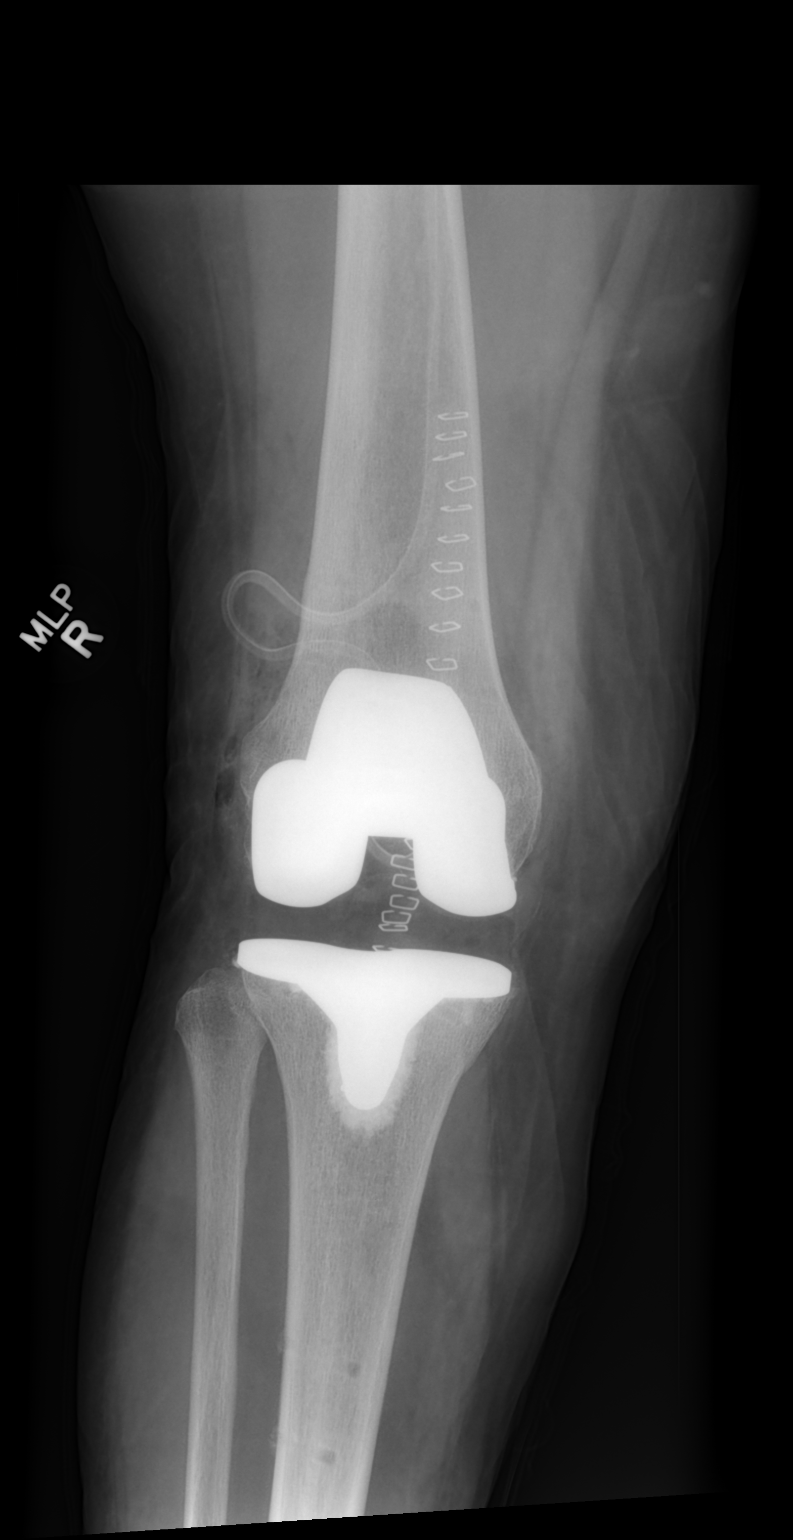
[im 2/2]
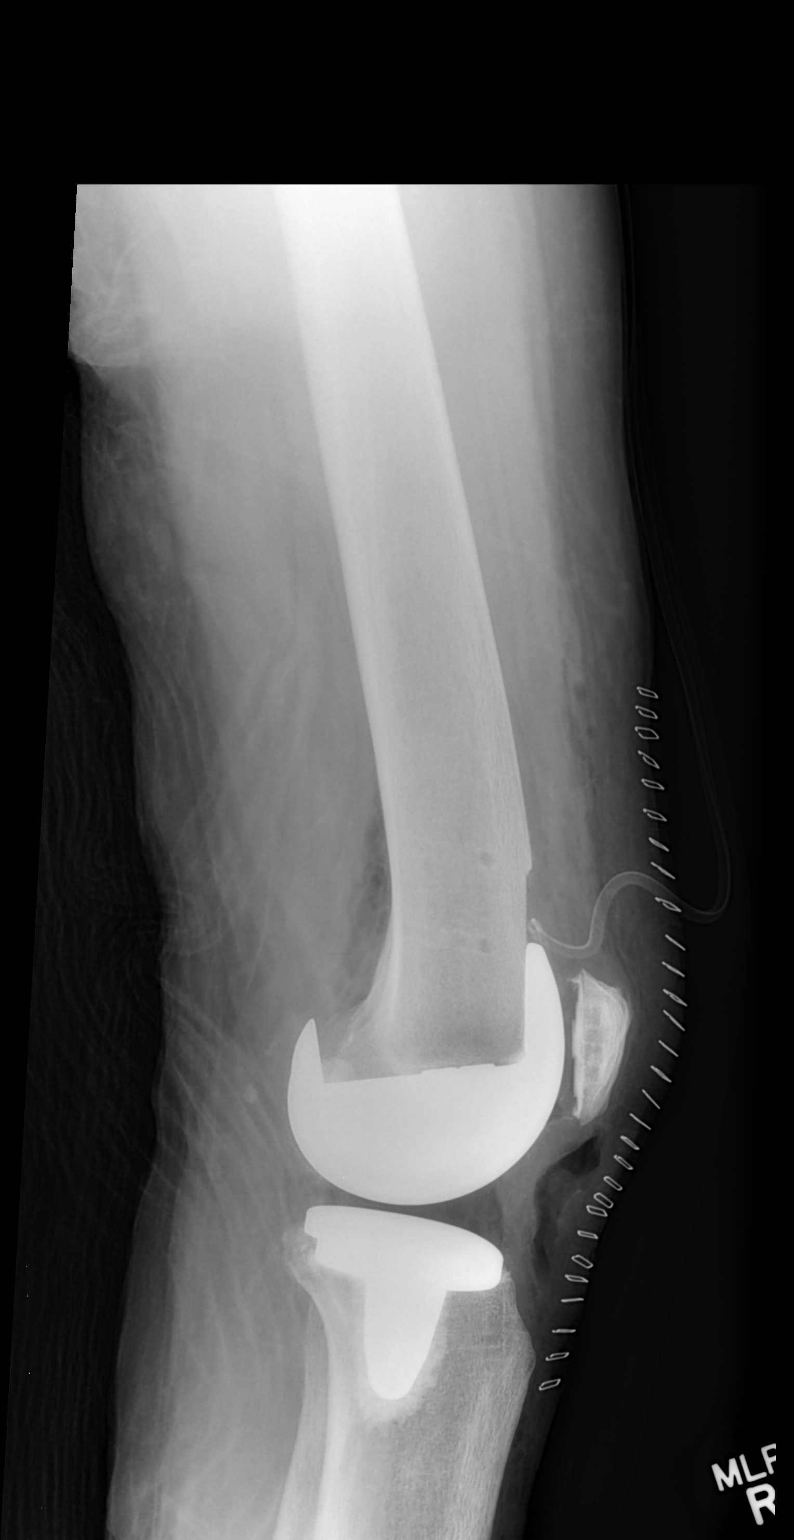

[2 of 2 positions shown; findings below may reference images not displayed]

FINDINGS: Diffuse osseous demineralization.
Components of right knee prosthesis in expected positions.
Surgical drain present.
No acute fracture or complication identified.
Expected soft tissue changes and skin clips.
IMPRESSION: Postoperative changes of right total knee arthroplasty without
acute complication.

## 2012-08-10 ENCOUNTER — Other Ambulatory Visit: Payer: Self-pay | Admitting: Gastroenterology

## 2012-08-18 ENCOUNTER — Ambulatory Visit (INDEPENDENT_AMBULATORY_CARE_PROVIDER_SITE_OTHER): Payer: Medicare Other | Admitting: Gastroenterology

## 2012-08-18 ENCOUNTER — Encounter: Payer: Self-pay | Admitting: Gastroenterology

## 2012-08-18 VITALS — BP 100/60 | HR 100 | Ht 65.0 in | Wt 163.1 lb

## 2012-08-18 DIAGNOSIS — Z9049 Acquired absence of other specified parts of digestive tract: Secondary | ICD-10-CM

## 2012-08-18 DIAGNOSIS — K6389 Other specified diseases of intestine: Secondary | ICD-10-CM

## 2012-08-18 DIAGNOSIS — Z9889 Other specified postprocedural states: Secondary | ICD-10-CM

## 2012-08-18 DIAGNOSIS — K219 Gastro-esophageal reflux disease without esophagitis: Secondary | ICD-10-CM

## 2012-08-18 MED ORDER — RIFAXIMIN 550 MG PO TABS: 550.0000 mg | ORAL_TABLET | Freq: Two times a day (BID) | ORAL | Status: DC

## 2012-08-18 NOTE — Patient Instructions (Signed)
We have sent the following medications to your pharmacy for you to pick up at your convenience: Xifaxan 550 mg, please take as directed  Please take the probiotic given today VSL #3, take one capsule by mouth once daily for ten days. Please keep in the refrigerator   Please call back when finished with Xifaxan and ask for Dr. Norval Gable nurse, Aram Beecham to give a report on how you are feeling

## 2012-08-18 NOTE — Progress Notes (Signed)
This is a 79 year old African American female who has recurrent bacterial overgrowth syndrome related to previous partial colectomy.  She has one to 2 episodes a year of abdominal gas, bloating, and early satiety which responds usually very quickly to Xifaxan and probiotics therapy.  She has recurrence of her symptomatology currently, and has rehabilitation currently from right knee replacement surgery.  The patient denies bowel or regularity, melena, hematochezia, nausea vomiting, has no intercurrent hepatitis or pancreatitis.  She is on Dexilant 60 mg a day for chronic GERD is using when necessary hydrocodone for pain and and apparently uses when necessary MiraLax for constipation.  She denies fever, chills, or systemic complaints.  She denies abuse of alcohol, cigarettes, or NSAIDs.  Current Medications, Allergies, Past Medical History, Past Surgical History, Family History and Social History were reviewed in [employer] record.  ROS: All systems were reviewed and are negative unless otherwise stated in the HPI.          Physical Exam: Blood pressure 100/60, pulse 100 and weight 163 the BMI of 27.15.  Her abdomen is not distended, and I cannot appreciate hepatosplenomegaly, masses or tenderness.  Bowel sounds are nonobstructive in nature.  Mental status is normal.  Patient is ambulatory with a swollen right knee, but no evidence of phlebitis.  Mental status is normal. No Known Allergies Outpatient Prescriptions Prior to Visit  Medication Sig Dispense Refill  . ALPRAZolam (XANAX) 0.25 MG tablet Take 1 tablet (0.25 mg total) by mouth 4 (four) times daily as needed. ANXIETY/ SPASM  30 tablet  0  . cholecalciferol (VITAMIN D) 1000 UNITS tablet Take 1,000 Units by mouth daily.      Marland Kitchen dexlansoprazole (DEXILANT) 60 MG capsule Take 60 mg by mouth daily.      . ferrous sulfate 325 (65 FE) MG tablet Take 1 tablet (325 mg total) by mouth 3 (three) times daily after meals.    3  .  fish oil-omega-3 fatty acids 1000 MG capsule Take 3 g by mouth daily.       Marland Kitchen HYDROcodone-acetaminophen (NORCO) 7.5-325 MG per tablet Take 1-2 tablets by mouth every 4 (four) hours as needed for pain.  100 tablet  0  . loratadine (CLARITIN) 10 MG tablet Take 10 mg by mouth daily as needed. ALLERGIES      . polyethylene glycol (MIRALAX / GLYCOLAX) packet Take 17 g by mouth 2 (two) times daily.  14 each  1  . Probiotic Product (ALIGN) 4 MG CAPS Take 4 mg by mouth daily.       . ramipril (ALTACE) 10 MG tablet Take 10 mg by mouth 2 (two) times daily.        . rifaximin (XIFAXAN) 550 MG TABS Take 550 mg by mouth 2 (two) times daily.      . Vitamin D, Ergocalciferol, (DRISDOL) 50000 UNITS CAPS Take 50,000 Units by mouth every 7 (seven) days. Friday       . fexofenadine (ALLEGRA) 180 MG tablet Take 180 mg by mouth daily as needed. ALLERGIES      . tiZANidine (ZANAFLEX) 4 MG capsule Take 1 capsule (4 mg total) by mouth 3 (three) times daily as needed for muscle spasms. Muscle spasms  50 capsule  1  . docusate sodium 100 MG CAPS Take 100 mg by mouth 2 (two) times daily.  10 capsule  0  . vitamin C (ASCORBIC ACID) 500 MG tablet Take 500 mg by mouth daily.  No facility-administered medications prior to visit.   Past Medical History  Diagnosis Date  . Hypertension   . GERD (gastroesophageal reflux disease)   . MVP (mitral valve prolapse)   . PVC's (premature ventricular contractions)   . Hiatal hernia   . Anemia   . Fatty liver   . Blind loop syndrome   . Schatzki's ring   . Vitamin B12 deficiency   . 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 Allergy     SEASONAL  . Arthritis     KNEES/HANDS  . Anxiety    Past Surgical History  Procedure Laterality Date  . Hemicolectomy      and ileectomy Dr Rolene Course; due to perforated viscus  . Rotator cuff repair  03-26-11    right arm  . Nuclear stress test  Sept 2012    Normal  . Replacement total knee      right  . Knee closed  reduction  01/22/2011    Procedure: CLOSED MANIPULATION KNEE;  Surgeon: Kathryne Hitch;  Location: [facility];  Service: Orthopedics;  Laterality: Right;  Manipulation under anesthesia right knee  . Cataract extraction, bilateral  03-26-11  . I&d knee with poly exchange  04/03/2011    Procedure: IRRIGATION AND DEBRIDEMENT KNEE WITH POLY EXCHANGE;  Surgeon: Kathryne Hitch, MD;  Location: [facility];  Service: Orthopedics;  Laterality: Right;  . Synovectomy  04/03/2011    Procedure: SYNOVECTOMY;  Surgeon: Kathryne Hitch, MD;  Location: [facility];  Service: Orthopedics;  Laterality: Right;  . Tubal ligation    . Total knee revision Right 07/14/2012    Procedure: RIGHT TOTAL KNEE REVISION;  Surgeon: [name], MD;  Location: [facility];  Service: Orthopedics;  Laterality: Right;   History   Social History  . Marital Status: Married    Spouse Name: N/A    Number of Children: 2  . Years of Education: N/A   Occupational History  . Retired    Social History Main Topics  . Smoking status: Never Smoker   . Smokeless tobacco: Never Used  . Alcohol Use: No  . Drug Use: No  . Sexually Active: No   Other Topics Concern  . None   Social History Narrative  . None   Family History  Problem Relation Age of Onset  . Hypertension Sister   . Diabetes Sister   . Diabetes Brother   . Coronary artery disease Brother   . Deep vein thrombosis Sister   . Colon cancer Neg Hx   . Colon polyps Neg Hx          Assessment and Plan: Recurrent bacterial overgrowth syndrome related to previous partial colectomy.  I have restarted Xifaxan 550 mg twice a day for 10 days and we will switch her to VSL#3 probiotic therapy for 10 days.  She is to call if she does not have response to the above regime.  She otherwise continue her medications as per primary care.  She apparently is on ferrous sulfate related to blood loss to her recent knee surgery.  There is no history of melena, hematochezia, or  hematemesis..  She has a history of chronic GERD and is on daily Dexilant, and denies current reflux symptoms or dysphagia.  She had a negative colonoscopy in 2007 separate evidence of previous colon surgery.

## 2012-08-24 ENCOUNTER — Other Ambulatory Visit: Payer: Self-pay | Admitting: Nurse Practitioner

## 2012-08-26 ENCOUNTER — Other Ambulatory Visit: Payer: Self-pay | Admitting: *Deleted

## 2012-08-26 MED ORDER — DEXLANSOPRAZOLE 60 MG PO CPDR: DELAYED_RELEASE_CAPSULE | ORAL | Status: DC

## 2012-08-27 ENCOUNTER — Other Ambulatory Visit: Payer: Self-pay | Admitting: Nurse Practitioner

## 2012-08-30 ENCOUNTER — Telehealth: Payer: Self-pay | Admitting: Gastroenterology

## 2012-08-30 NOTE — Telephone Encounter (Signed)
Pt with frequent bacterial overgrowth r/t partial colectomy. She was seen 08/18/12 and placed on Xifaxan 550 mg BID x 10days with VSL#3. Pt states she has done as she always does, but the meds aren't working. States she has no appetite, food doesn't taste right and she forces herself. Please advise. Thanks.

## 2012-08-31 MED ORDER — VSL#3 PO CAPS: 3.0000 | ORAL_CAPSULE | Freq: Every day | ORAL | Status: DC

## 2012-08-31 MED ORDER — RIFAXIMIN 550 MG PO TABS: 550.0000 mg | ORAL_TABLET | Freq: Two times a day (BID) | ORAL | Status: DC

## 2012-08-31 NOTE — Telephone Encounter (Signed)
Informed pt she needs another round of xifaxan and VSL; pt stated understanding.

## 2012-08-31 NOTE — Telephone Encounter (Signed)
lmom for pt to call back

## 2012-08-31 NOTE — Telephone Encounter (Signed)
Repeat RX

## 2012-11-01 ENCOUNTER — Telehealth: Payer: Self-pay | Admitting: Gastroenterology

## 2012-11-01 ENCOUNTER — Telehealth: Payer: Self-pay | Admitting: *Deleted

## 2012-11-01 MED ORDER — VSL#3 PO CAPS: 3.0000 | ORAL_CAPSULE | Freq: Every day | ORAL | Status: DC

## 2012-11-01 NOTE — Telephone Encounter (Signed)
I called patient back, LMOM for call back

## 2012-11-01 NOTE — Telephone Encounter (Signed)
Patient needed VSL # 3 samples Patient will be here tomorrow to pick up samples

## 2012-12-19 ENCOUNTER — Other Ambulatory Visit: Payer: Self-pay | Admitting: Gastroenterology

## 2013-01-03 ENCOUNTER — Telehealth: Payer: Self-pay | Admitting: Gastroenterology

## 2013-01-03 NOTE — Telephone Encounter (Signed)
Patient will be here to pick up VSL # 3, advised patient that she will have to ask for me because it has to be kept in refrigerator. Patient verbalized understanding

## 2013-01-07 IMAGING — CR DG KNEE 1-2V PORT*R*
1 series · 2 of 2 positions shown · non-contrast
Comparison: 10/31/2010

CLINICAL DATA: Postop right knee arthroplasty.

PORTABLE RIGHT KNEE - 1-2 VIEW

[Series 1: AP · right · 2 of 2 slices shown]
[im 1/2]
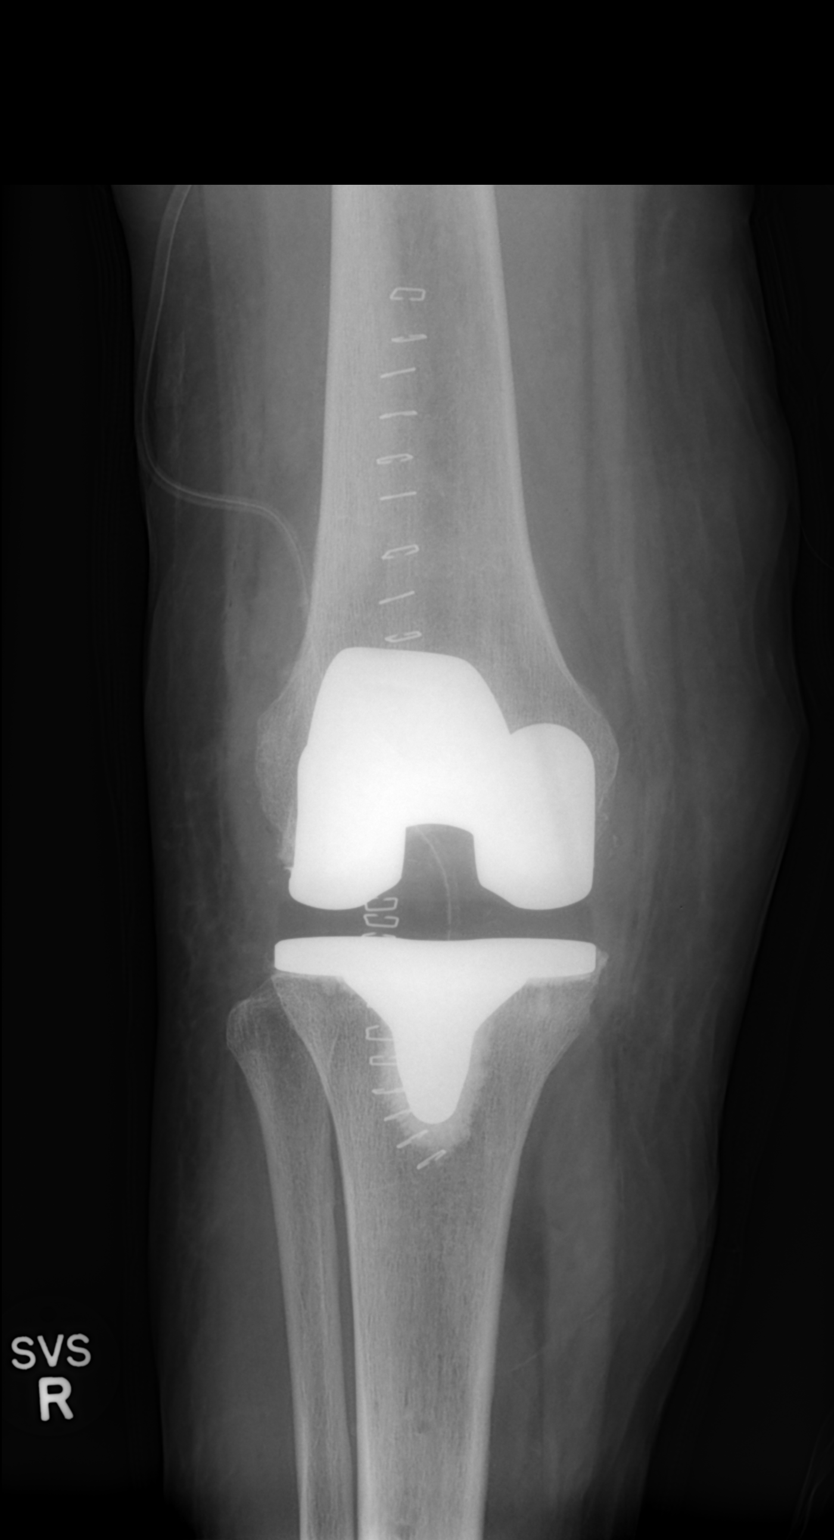
[im 2/2]
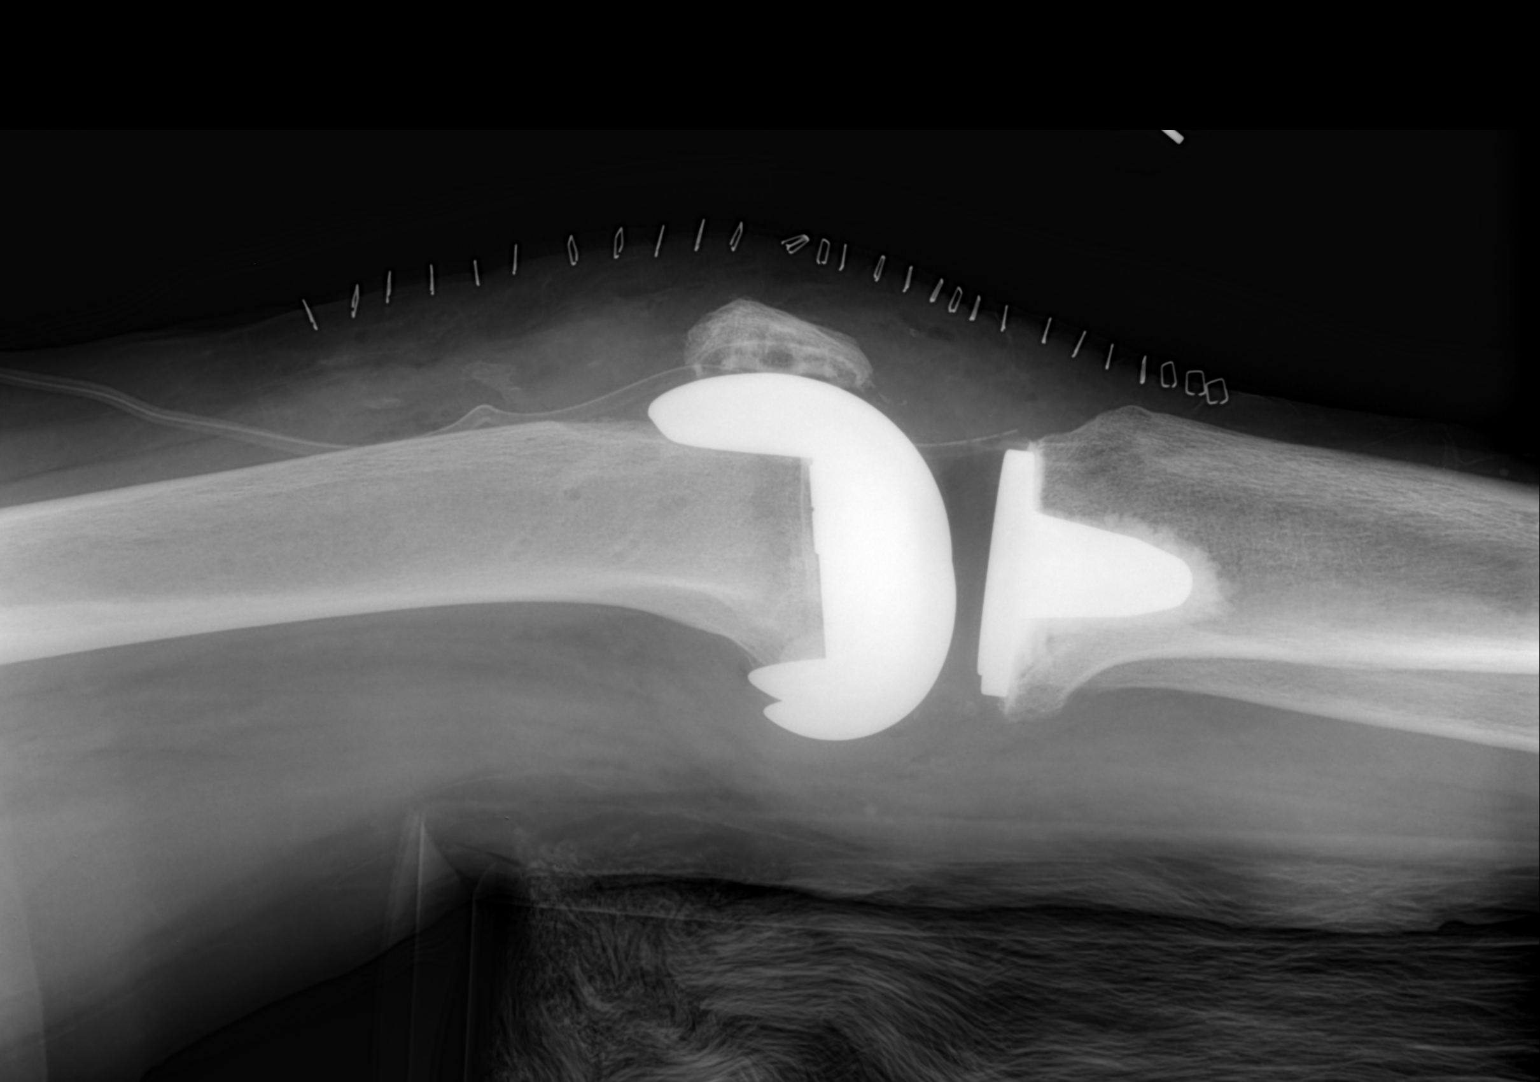

[2 of 2 positions shown; findings below may reference images not displayed]

FINDINGS: Changes of right knee replacement.  No hardware or bony
complicating feature.  Soft tissue drain in place.
IMPRESSION: Right knee replacement.  No complicating feature.

## 2013-01-27 ENCOUNTER — Encounter: Payer: Self-pay | Admitting: Gastroenterology

## 2013-01-27 ENCOUNTER — Ambulatory Visit (INDEPENDENT_AMBULATORY_CARE_PROVIDER_SITE_OTHER): Payer: Medicare PPO | Admitting: Gastroenterology

## 2013-01-27 VITALS — BP 120/80 | HR 88 | Ht 65.0 in | Wt 165.8 lb

## 2013-01-27 DIAGNOSIS — Z9889 Other specified postprocedural states: Secondary | ICD-10-CM

## 2013-01-27 DIAGNOSIS — K6389 Other specified diseases of intestine: Secondary | ICD-10-CM

## 2013-01-27 DIAGNOSIS — D509 Iron deficiency anemia, unspecified: Secondary | ICD-10-CM

## 2013-01-27 DIAGNOSIS — Z9049 Acquired absence of other specified parts of digestive tract: Secondary | ICD-10-CM

## 2013-01-27 MED ORDER — METRONIDAZOLE 250 MG PO TABS: 250.0000 mg | ORAL_TABLET | Freq: Two times a day (BID) | ORAL | Status: DC

## 2013-01-27 NOTE — Patient Instructions (Signed)
We have sent the following medications to your pharmacy for you to pick up at your convenience: Flagyl 250 mg, please take one tablet by mouth twice daily for ten days then stop   Samples of a probiotic VSL #3 was given today, please keep in the refrigerator. Take one packet and sprinkle in applesauce or juice.

## 2013-01-27 NOTE — Progress Notes (Signed)
This is a 80 year old African American female who has had previous right hemicolectomy, reasons unclear.  She's had several workups for iron deficiency anemia which have been negative including small bowel biopsy.  She recently had rather severe anemia related to knee surgery, and is on oral iron therapy and followed by Dr.Russo in internal medicine.  She has episodes several times a year of bacterial overgrowth syndrome with associated diarrhea, and was treated with Xifaxan several months ago.  She now has recurrence of her diarrhea, and has not been on any other antibiotics in the last year.  She denies melena, hematochezia, significant abdominal pain, other upper or lower GI or hepatobiliary complaints.  Her appetite remains good and her weight is stable and she denies a specific food intolerances.  She is on ?? diclofenac 50 mg a day, ferrous sulfate, and when necessary probiotics and when necessary MiraLax.   Current Medications, Allergies, Past Medical History, Past Surgical History, Family History and Social History were reviewed in [employer] record.  ROS: All systems were reviewed and are negative unless otherwise stated in the HPI.          Physical Exam: Blood pressure 120/80, pulse 88 and regular and weight 165 with a BMI of 27.59.  Chest is clear she appear to be in a regular rhythm without murmurs gallops or rubs.  There is no organomegaly, abdominal masses or tenderness.  Bowel sounds are normal.  Rectal exam is deferred.  Mental status is normal    Assessment and Plan: This patient does not have celiac disease and I have removed this from her list of diagnoses.  I am fairly certain that her recurrent diarrheas for bacterial overgrowth syndrome, and I placed her on metronidazole 500 mg twice a day for 10 days along with VSL#3 probiotics for 10 days.  I do not have any Xifaxan samples to distribute.  Her iron deficiency anemia is followed by her primary care  physician, and if she is on diclofenac, she probably should be on a PPI medication with her other comorbid medical problems.  She in the past did have rather persistent acid reflux problems.  She is to call if this does not sufficiently take care of her diarrhea.  Her new gastroenterologist will be Dr.Pyrtle upon my return to

## 2013-02-10 ENCOUNTER — Telehealth: Payer: Self-pay | Admitting: Gastroenterology

## 2013-02-13 NOTE — Telephone Encounter (Signed)
Pt with hx of Bacterial Overgrowth; OV 01/27/13 for diarrhea usually tx with Xifaxan, but we had no samples. She was given Flagyl 500mg  BID and VSL#3. Pt reports she finished yesterday. States she had diarrhea the whole time while taking the meds, but the diarrhea has slowed and her stools are becoming more formed; she had 3 stools last night.  Is this a sign she is getting better or does she need another med? Please advise. Thanks.

## 2013-02-13 NOTE — Telephone Encounter (Signed)
Informed pt Dr Sharlett Iles feels she is getting better. Instructed her to call in a few days if she worsens or stools get looser; pt stated understanding.

## 2013-02-13 NOTE — Telephone Encounter (Signed)
Getting better.

## 2013-02-13 NOTE — Telephone Encounter (Signed)
lmom for pt to call back

## 2013-03-06 ENCOUNTER — Ambulatory Visit (HOSPITAL_COMMUNITY)
Admission: RE | Admit: 2013-03-06 | Discharge: 2013-03-06 | Disposition: A | Discharge status: Home / Self Care | Payer: Medicare PPO | Source: Ambulatory Visit | Attending: Surgery | Admitting: Surgery

## 2013-03-06 ENCOUNTER — Other Ambulatory Visit (HOSPITAL_COMMUNITY): Payer: Self-pay | Admitting: Internal Medicine

## 2013-03-06 DIAGNOSIS — R0989 Other specified symptoms and signs involving the circulatory and respiratory systems: Secondary | ICD-10-CM

## 2013-03-30 ENCOUNTER — Telehealth: Payer: Self-pay | Admitting: Gastroenterology

## 2013-03-30 NOTE — Telephone Encounter (Signed)
Patient will be your patient, last office visit was 01-2013. Is it ok to give samples of VSL # 3?

## 2013-03-31 NOTE — Telephone Encounter (Signed)
Thank you. Patient will be here today to pick up samples.

## 2013-03-31 NOTE — Telephone Encounter (Signed)
Yes, okay for samples.

## 2013-04-04 ENCOUNTER — Telehealth: Payer: Self-pay | Admitting: *Deleted

## 2013-04-04 MED ORDER — DEXLANSOPRAZOLE 60 MG PO CPDR: DELAYED_RELEASE_CAPSULE | ORAL | Status: DC

## 2013-04-04 NOTE — Telephone Encounter (Signed)
Request from [facility] patient needs refill on Dexilant. Patient's last office visit was 01-27-13 with Dr. Sharlett Iles and was advised to follow up with you yearly or as needed. Is it ok to send refill?

## 2013-04-04 NOTE — Telephone Encounter (Signed)
Ok to refill 

## 2013-04-21 ENCOUNTER — Other Ambulatory Visit: Payer: Self-pay | Admitting: Gastroenterology

## 2013-04-24 ENCOUNTER — Emergency Department (INDEPENDENT_AMBULATORY_CARE_PROVIDER_SITE_OTHER): Payer: Commercial Managed Care - HMO

## 2013-04-24 ENCOUNTER — Emergency Department (HOSPITAL_COMMUNITY)
Admission: EM | Admit: 2013-04-24 | Discharge: 2013-04-24 | Disposition: A | Discharge status: Home / Self Care | Payer: Commercial Managed Care - HMO | Source: Home / Self Care | Attending: Family Medicine | Admitting: Family Medicine

## 2013-04-24 ENCOUNTER — Encounter (HOSPITAL_COMMUNITY): Payer: Self-pay | Admitting: Emergency Medicine

## 2013-04-24 DIAGNOSIS — Z96659 Presence of unspecified artificial knee joint: Secondary | ICD-10-CM

## 2013-04-24 DIAGNOSIS — M25569 Pain in unspecified knee: Secondary | ICD-10-CM

## 2013-04-24 MED ORDER — TRAMADOL HCL 50 MG PO TABS: 50.0000 mg | ORAL_TABLET | Freq: Four times a day (QID) | ORAL | Status: DC | PRN

## 2013-04-24 MED ORDER — DEXLANSOPRAZOLE 60 MG PO CPDR: DELAYED_RELEASE_CAPSULE | ORAL | Status: DC

## 2013-04-24 NOTE — Telephone Encounter (Signed)
Patient's last office visit was 01-2013. Patient was told to follow up with you. Patient is requesting refill on Dexilant. Is it ok to refill?

## 2013-04-24 NOTE — ED Provider Notes (Signed)
Medical screening examination/treatment/procedure(s) were performed by resident physician or non-physician practitioner and as supervising physician I was immediately available for consultation/collaboration.   Pauline Good MD.   Billy Fischer, MD 04/24/13 2125

## 2013-04-24 NOTE — Addendum Note (Signed)
Addended by: Hope Pigeon A on: 04/24/2013 08:59 AM   Modules accepted: Orders

## 2013-04-24 NOTE — ED Provider Notes (Signed)
CSN: [id number]     Arrival date & time 04/24/13  1350 History   First MD Initiated Contact with Patient 04/24/13 1552     Chief Complaint  Patient presents with  . Knee Pain   (Consider location/radiation/quality/duration/timing/severity/associated sxs/prior Treatment) HPI Comments: 80 year old female with history of right knee surgery x3 - TKA with 2 revisions - presents complaining of worsening right knee pain for the past 6 weeks. She says she feels like her knee replacement has come loose and something is moving in the leg. She talked to her orthopedic surgeon about this who told her to come back in June and they wouldn't repeat the x-ray but did do physical therapy for now. she is doing physical therapy and is making her pain worse. The pain is made worse by any weightbearing activity and is somewhat relieved by rest. She takes tramadol at night which is helpful but she says she cannot take it in the daytime. She denies any injury to her leg. No swelling of the leg. No other issues at this time.  Patient is a 80 y.o. female presenting with knee pain.  Knee Pain   Past Medical History  Diagnosis Date  . Hypertension   . GERD (gastroesophageal reflux disease)   . MVP (mitral valve prolapse)   . PVC's (premature ventricular contractions)   . Hiatal hernia   . Anemia   . Fatty liver   . Blind loop syndrome   . Schatzki's ring   . Vitamin B12 deficiency   . 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 Allergy     SEASONAL  . Arthritis     KNEES/HANDS  . Anxiety    Past Surgical History  Procedure Laterality Date  . Hemicolectomy      and ileectomy Dr Vida Rigger; due to perforated viscus  . Rotator cuff repair  03-26-11    right arm  . Nuclear stress test  Sept 2012    Normal  . Replacement total knee      right  . Knee closed reduction  01/22/2011    Procedure: CLOSED MANIPULATION KNEE;  Surgeon: Mcarthur Rossetti;  Location: [place];  Service: Orthopedics;  Laterality:  Right;  Manipulation under anesthesia right knee  . Cataract extraction, bilateral  03-26-11  . I&d knee with poly exchange  04/03/2011    Procedure: IRRIGATION AND DEBRIDEMENT KNEE WITH POLY EXCHANGE;  Surgeon: Mcarthur Rossetti, MD;  Location: [facility];  Service: Orthopedics;  Laterality: Right;  . Synovectomy  04/03/2011    Procedure: SYNOVECTOMY;  Surgeon: Mcarthur Rossetti, MD;  Location: [facility];  Service: Orthopedics;  Laterality: Right;  . Tubal ligation    . Total knee revision Right 07/14/2012    Procedure: RIGHT TOTAL KNEE REVISION;  Surgeon: Mauri Pole, MD;  Location: [facility];  Service: Orthopedics;  Laterality: Right;   Family History  Problem Relation Age of Onset  . Hypertension Sister   . Diabetes Sister   . Diabetes Brother   . Coronary artery disease Brother   . Deep vein thrombosis Sister   . Colon cancer Neg Hx   . Colon polyps Neg Hx    History  Substance Use Topics  . Smoking status: Never Smoker   . Smokeless tobacco: Never Used  . Alcohol Use: No   OB History   Grav Para Term Preterm Abortions TAB SAB Ect Mult Living  Review of Systems  Musculoskeletal:       See history of present illness  All other systems reviewed and are negative.    Allergies  Review of patient's allergies indicates no known allergies.  Home Medications   Current Outpatient Rx  Name  Route  Sig  Dispense  Refill  . DEXILANT 60 MG capsule      TAKE 1 CAPSULE DAILY 30 MINUTES PRIOR TO BREAKFAST   30 capsule   3   . dexlansoprazole (DEXILANT) 60 MG capsule      Take 1 capsule by mouth daily, 30 min before breakfast.   30 capsule   4   . diclofenac (VOLTAREN) 50 MG EC tablet               . ramipril (ALTACE) 10 MG tablet   Oral   Take 10 mg by mouth 2 (two) times daily.           . Vitamin D, Ergocalciferol, (DRISDOL) 50000 UNITS CAPS   Oral   Take 50,000 Units by mouth every 7 (seven) days. Friday          . ALPRAZolam (XANAX)  0.25 MG tablet   Oral   Take 1 tablet (0.25 mg total) by mouth 4 (four) times daily as needed. ANXIETY/ SPASM   30 tablet   0   . cholecalciferol (VITAMIN D) 1000 UNITS tablet   Oral   Take 1,000 Units by mouth daily.         . ferrous sulfate 325 (65 FE) MG tablet   Oral   Take 1 tablet (325 mg total) by mouth 3 (three) times daily after meals.      3   . fish oil-omega-3 fatty acids 1000 MG capsule   Oral   Take 3 g by mouth daily.          Marland Kitchen HYDROcodone-acetaminophen (NORCO) 7.5-325 MG per tablet   Oral   Take 1-2 tablets by mouth every 4 (four) hours as needed for pain.   100 tablet   0   . loratadine (CLARITIN) 10 MG tablet   Oral   Take 10 mg by mouth daily as needed. ALLERGIES         . metroNIDAZOLE (FLAGYL) 250 MG tablet   Oral   Take 1 tablet (250 mg total) by mouth 2 (two) times daily.   20 tablet   0     Take for ten days and then stop   . polyethylene glycol (MIRALAX / GLYCOLAX) packet   Oral   Take 17 g by mouth 2 (two) times daily.   14 each   1   . Probiotic Product (ALIGN) 4 MG CAPS   Oral   Take 4 mg by mouth daily.          . Probiotic Product (VSL#3) CAPS   Oral   Take 3 capsules by mouth daily.   30 capsule   0   . rifaximin (XIFAXAN) 550 MG TABS tablet   Oral   Take 1 tablet (550 mg total) by mouth 2 (two) times daily.   20 tablet   0     Please take for 10 days and stop   . traMADol (ULTRAM) 50 MG tablet   Oral   Take 1 tablet (50 mg total) by mouth every 6 (six) hours as needed.   30 tablet   0    BP 149/59  Pulse 67  Temp(Src) 98.7 F (37.1 C) (  Oral)  Resp 16  SpO2 100% Physical Exam  Nursing note and vitals reviewed. Constitutional: She is oriented to person, place, and time. Vital signs are normal. She appears well-developed and well-nourished. No distress.  HENT:  Head: Normocephalic and atraumatic.  Pulmonary/Chest: Effort normal. No respiratory distress.  Musculoskeletal:       Right knee: She  exhibits decreased range of motion. She exhibits no swelling and no erythema. Tenderness (diffuse, but worse below the knee) found.       Legs: Neurological: She is alert and oriented to person, place, and time. She has normal strength. Coordination normal.  Skin: Skin is warm and dry. No rash noted. She is not diaphoretic.  Psychiatric: She has a normal mood and affect. Judgment normal.    ED Course  Procedures (including critical care time) Labs Review Labs Reviewed - No data to display Imaging Review Dg Knee Complete 4 Views Right  04/24/2013   CLINICAL DATA:  Right knee pain  EXAM: RIGHT KNEE - COMPLETE 4+ VIEW  COMPARISON:  04/03/2011  FINDINGS: Four views of the right knee submitted. No acute fracture or subluxation. Again noted right knee prosthesis in anatomic alignment. No evidence of prosthesis loosening.  IMPRESSION: No acute fracture or subluxation. No evidence of prosthesis loosening.   Electronically Signed   By: Lahoma Crocker M.D.   On: 04/24/2013 16:45     MDM   1. Knee pain   2. History of total knee arthroplasty    X-ray looks normal, no loosening of prosthesis. Will increase her dose of tramadol and she will followup with her orthopedic surgeon.   Meds ordered this encounter  Medications  . traMADol (ULTRAM) 50 MG tablet    Sig: Take 1 tablet (50 mg total) by mouth every 6 (six) hours as needed.    Dispense:  30 tablet    Refill:  0    Order Specific Question:  Supervising Provider    Answer:  Jake Michaelis, DAVID C [[id number]]       Liam Graham, PA-C 04/24/13 1659

## 2013-04-24 NOTE — ED Notes (Signed)
C/o  Bilateral knee pain x 4 wks.  Hx of knee surgery x 3 on right knee.  No relief with otc  Pain meds.  Denies any recent injuries.

## 2013-04-24 NOTE — Discharge Instructions (Signed)
Knee Rehabilitation, Guidelines Following Surgery °Results after knee surgery are often greatly improved when you follow the exercise, range of motion and muscle strengthening exercises prescribed by your doctor. Safety measures are also important to protect the knee from further injury. Any time any of these exercises cause you to have increased pain or swelling in your knee joint, decrease the amount until you are comfortable again and slowly increase them. If you have problems or questions, call your caregiver or physical therapist for advice. °HOME CARE INSTRUCTIONS  °· Remove items at home which could result in a fall. This includes throw rugs or furniture in walking pathways. °· Continue medications as instructed. °· You may shower or take tub baths when your staples or stitches are removed or as instructed. °· Walk using crutches or walker as instructed. °· Put weight on your legs and walk as much as is comfortable. °· You may resume a sexual relationship in one month or when given the OK by your doctor. °· Return to work as instructed by your doctor. °· Do not drive a car for 6 weeks or as instructed. °· Wear elastic stockings until instructed not to. °· Make sure you keep all of your appointments after your operation with all of your doctors and caregivers. °RANGE OF MOTION AND STRENGTHENING EXERCISES °Rehabilitation of the knee is important following a knee injury or an operation. After just a few days of immobilization, the muscles of the thigh which control the knee become weakened and shrink (atrophy). Knee exercises are designed to build up the tone and strength of the thigh muscles and to improve knee motion. Often times heat used for twenty to thirty minutes before working out will loosen up your tissues and help with improving the range of motion. These exercises can be done on a training (exercise) mat, on the floor, on a table or on a bed. Use what ever works the best and is most comfortable for  you Knee exercises include: °· Leg Lifts - While your knee is still immobilized in a splint or cast, you can do straight leg raises. Lift the leg to 60 degrees, hold for 3 sec, and slowly lower the leg. Repeat 10-20 times 2-3 times daily. Perform this exercise against resistance later as your knee gets better. °· Quad and Hamstring Sets - Tighten up the muscle on the front of the thigh (Quad) and hold for 5-10 sec. Repeat this 10-20 times hourly. Hamstring sets are done by pushing the foot backward against an object and holding for 5-10 sec. Repeat as with quad sets. °· Resistance and Weight exercises - After your knee no longer needs to be immobilized, progressive motion, resistance, and weight lifting exercises should be performed. This is best done under the guidance of a physical therapist. You may safely start with wall squats; with your feet 10 inches from a wall, place your back flat against the wall and lower your trunk about 6 inches until you feel work in your thighs. Hold 20-40 seconds, then rise slowly. Do 3-6 repetitions 2-3 times daily. °· Endurance Training - Bicycle and walking are helpful in restoring strength and endurance to the leg. °A rehabilitation program following serious knee injuries can speed recovery and prevent re-injury in the future due to weakened muscles. Contact your doctor or a physical therapist for more information on knee rehabilitation. °MAKE SURE YOU:  °· Understand these instructions. °· Will watch your condition. °· Will get help right away if you are not doing   well or get worse. Document Released: 01/05/2005 Document Revised: 03/30/2011 Document Reviewed: 06/25/2006 [facility] Patient Information 2014 [place].

## 2013-06-05 ENCOUNTER — Encounter: Payer: Self-pay | Admitting: Nurse Practitioner

## 2013-06-05 ENCOUNTER — Ambulatory Visit (INDEPENDENT_AMBULATORY_CARE_PROVIDER_SITE_OTHER): Payer: Medicare PPO | Admitting: Nurse Practitioner

## 2013-06-05 VITALS — BP 124/58 | HR 70 | Ht 65.0 in | Wt 171.0 lb

## 2013-06-05 DIAGNOSIS — R140 Abdominal distension (gaseous): Secondary | ICD-10-CM

## 2013-06-05 DIAGNOSIS — K6389 Other specified diseases of intestine: Secondary | ICD-10-CM

## 2013-06-05 DIAGNOSIS — R142 Eructation: Secondary | ICD-10-CM

## 2013-06-05 DIAGNOSIS — R141 Gas pain: Secondary | ICD-10-CM

## 2013-06-05 DIAGNOSIS — R143 Flatulence: Secondary | ICD-10-CM

## 2013-06-05 DIAGNOSIS — R197 Diarrhea, unspecified: Secondary | ICD-10-CM

## 2013-06-05 MED ORDER — METRONIDAZOLE 250 MG PO TABS: 250.0000 mg | ORAL_TABLET | Freq: Two times a day (BID) | ORAL | Status: AC

## 2013-06-05 NOTE — Patient Instructions (Signed)
We sent a prescription to [facility] for Flagyl ( Metronidazole) 250 mg , twice daily x 10 days. Call us for an appointment if you have any recurrent problems.

## 2013-06-06 NOTE — Progress Notes (Addendum)
HPI :  This is a 80 year old female, followed for years by Dr. Sharlett Iles. Patient is s/p remote right hemicolectomy for  unclear reasons. She has a history of GERD, recurrent bacterial overgrowth associated diarrhea, and a history of iron deficiency anemia with negative workup including small bowel biopsies. Her last colonoscopy (2007) was normal to anastomosis.   Patient comes in today with recurrent bloating and non-bloody diarrhea. No recent antibiotics or med changes. She follows a gluten free diet though doesn't have celiac disease. Avoids dairy. She takes daily probiotics. GI symptoms same as always. In between the episodes she feels fine for several months. Uses Miralax as needed for constipation. Her weight is stable. Patient did see a small amount of blood in stool earlier this year but tells me that subsequent to that, 3 hemoccults by PCP were negative.    Past Medical History  Diagnosis Date  . Hypertension   . GERD (gastroesophageal reflux disease)   . MVP (mitral valve prolapse)   . PVC's (premature ventricular contractions)   . Hiatal hernia   . Anemia   . Fatty liver   . Blind loop syndrome   . Schatzki's ring   . Vitamin B12 deficiency   . 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 Allergy     SEASONAL  . Arthritis     KNEES/HANDS  . Anxiety     Family History  Problem Relation Age of Onset  . Hypertension Sister   . Diabetes Sister   . Diabetes Brother   . Coronary artery disease Brother   . Deep vein thrombosis Sister   . Colon cancer Neg Hx   . Colon polyps Neg Hx    History  Substance Use Topics  . Smoking status: Never Smoker   . Smokeless tobacco: Never Used  . Alcohol Use: No   Current Outpatient Prescriptions  Medication Sig Dispense Refill  . ALPRAZolam (XANAX) 0.25 MG tablet Take 1 tablet (0.25 mg total) by mouth 4 (four) times daily as needed. ANXIETY/ SPASM  30 tablet  0  . cholecalciferol (VITAMIN D) 1000 UNITS tablet Take  1,000 Units by mouth daily.      Marland Kitchen DEXILANT 60 MG capsule TAKE 1 CAPSULE DAILY 30 MINUTES PRIOR TO BREAKFAST  30 capsule  3  . diclofenac (VOLTAREN) 50 MG EC tablet       . ferrous sulfate 325 (65 FE) MG tablet Take 1 tablet (325 mg total) by mouth 3 (three) times daily after meals.    3  . fish oil-omega-3 fatty acids 1000 MG capsule Take 3 g by mouth daily.       Marland Kitchen HYDROcodone-acetaminophen (NORCO) 7.5-325 MG per tablet Take 1-2 tablets by mouth every 4 (four) hours as needed for pain.  100 tablet  0  . loratadine (CLARITIN) 10 MG tablet Take 10 mg by mouth daily as needed. ALLERGIES      . polyethylene glycol (MIRALAX / GLYCOLAX) packet Take 17 g by mouth 2 (two) times daily.  14 each  1  . Probiotic Product (ALIGN) 4 MG CAPS Take 4 mg by mouth daily.       . Probiotic Product (VSL#3) CAPS Take 3 capsules by mouth daily.  30 capsule  0  . ramipril (ALTACE) 10 MG tablet Take 10 mg by mouth 2 (two) times daily.        . traMADol (ULTRAM) 50 MG tablet Take 1 tablet (50 mg total) by mouth every 6 (six) hours  as needed.  30 tablet  0  . Vitamin D, Ergocalciferol, (DRISDOL) 50000 UNITS CAPS Take 50,000 Units by mouth every 7 (seven) days. Friday       . metroNIDAZOLE (FLAGYL) 250 MG tablet Take 1 tablet (250 mg total) by mouth 2 (two) times daily.  20 tablet  0   No current facility-administered medications for this visit.   No Known Allergies   Review of Systems: All systems reviewed and negative except where noted in HPI.   Physical Exam: BP 124/58  Pulse 70  Ht 5\' 5"  (1.651 m)  Wt 171 lb (77.565 kg)  BMI 28.46 kg/m2 Constitutional: Pleasant,well-developed, black female in no acute distress. HEENT: Normocephalic and atraumatic. Conjunctivae are normal. No scleral icterus. Neck supple.  Cardiovascular: Normal rate, regular rhythm.  Pulmonary/chest: Effort normal and breath sounds normal. No wheezing, rales or rhonchi. Abdominal: Soft, nondistended, nontender. Bowel sounds active  throughout. There are no masses palpable. No hepatomegaly. Extremities: no edema Lymphadenopathy: No cervical adenopathy noted. Neurological: Alert and oriented to person place and time. Skin: Skin is warm and dry. No rashes noted. Psychiatric: Normal mood and affect. Behavior is normal.   ASSESSMENT AND PLAN:  11. 80 year old female with recurrent bloating and non-bloody diarrhea. Symptoms identical to several previous episodes felt to be secondary to bacterial overgrowth. Xifaxan too expensive for her. Symptoms always respond to flagyl so will give her a 10 day course. Patient to call if symptoms don't resolve following treatment. She may continue probiotics  2. Right hemicolectomy, unknown reason.   3. History of iron deficiency anemia with negative workup including small bowel biopsies. On oral iron under direction of PCP.    Addendum: Reviewed and agree with initial management. Jerene Bears, MD

## 2013-07-17 ENCOUNTER — Other Ambulatory Visit: Payer: Self-pay | Admitting: Nurse Practitioner

## 2013-08-16 ENCOUNTER — Other Ambulatory Visit: Payer: Self-pay | Admitting: Internal Medicine

## 2013-12-25 ENCOUNTER — Other Ambulatory Visit (HOSPITAL_COMMUNITY): Payer: Self-pay | Admitting: Orthopedic Surgery

## 2013-12-25 DIAGNOSIS — M25561 Pain in right knee: Secondary | ICD-10-CM

## 2014-01-03 ENCOUNTER — Ambulatory Visit (HOSPITAL_COMMUNITY): Payer: Commercial Managed Care - HMO

## 2014-01-03 ENCOUNTER — Ambulatory Visit (HOSPITAL_COMMUNITY)
Admission: RE | Admit: 2014-01-03 | Discharge: 2014-01-03 | Disposition: A | Discharge status: Home / Self Care | Payer: Commercial Managed Care - HMO | Source: Ambulatory Visit | Attending: Orthopedic Surgery | Admitting: Orthopedic Surgery

## 2014-01-03 DIAGNOSIS — M25561 Pain in right knee: Secondary | ICD-10-CM | POA: Insufficient documentation

## 2014-01-03 DIAGNOSIS — Z96651 Presence of right artificial knee joint: Secondary | ICD-10-CM | POA: Insufficient documentation

## 2014-01-03 MED ORDER — TECHNETIUM TC 99M MEDRONATE IV KIT
25.9000 | PACK | Freq: Once | INTRAVENOUS | Status: AC | PRN
  Administered 2014-01-03: 25.9 via INTRAVENOUS

## 2014-10-08 ENCOUNTER — Encounter: Payer: Self-pay | Admitting: Gastroenterology

## 2014-10-30 NOTE — Patient Instructions (Addendum)
ZHANAE PROFFIT  10/30/2014   Your procedure is scheduled on: 11/13/2014    Report to [facility] Main  Entrance take [facility]  elevators to 3rd floor to  [place] at     1000 AM.  Call this number if you have problems the morning of surgery [phone]   Remember: ONLY 1 PERSON MAY GO WITH YOU TO SHORT STAY TO GET  READY MORNING OF [place].               Follow Dr. Aurea Graff instruction about coming off Vitamin D prior to surgery.             Do not eat food or drink liquids :After Midnight.     Take these medicines the morning of surgery with A SIP OF WATER: Xanax if needed, Amlodipine ( NOrvasc), Claritin if needed                                 You may not have any metal on your body including hair pins and              piercings  Do not wear jewelry, make-up, lotions, powders or perfumes, deodorant             Do not wear nail polish.  Do not shave  48 hours prior to surgery.                 Do not bring valuables to the hospital. [place].  Contacts, dentures or bridgework may not be worn into surgery.  Leave suitcase in the car. After surgery it may be brought to your room.       Special Instructions: coughing and deep breathing exercises, leg exercises               Please read over the following fact sheets you were given: _____________________________________________________________________             [facility] - Preparing for Surgery Before surgery, you can play an important role.  Because skin is not sterile, your skin needs to be as free of germs as possible.  You can reduce the number of germs on your skin by washing with CHG (chlorahexidine gluconate) soap before surgery.  CHG is an antiseptic cleaner which kills germs and bonds with the skin to continue killing germs even after washing. Please DO NOT use if you have an allergy to CHG or antibacterial soaps.  If your skin  becomes reddened/irritated stop using the CHG and inform your nurse when you arrive at Short Stay. Do not shave (including legs and underarms) for at least 48 hours prior to the first CHG shower.  You may shave your face/neck. Please follow these instructions carefully:  1.  Shower with CHG Soap the night before surgery and the  morning of Surgery.  2.  If you choose to wash your hair, wash your hair first as usual with your  normal  shampoo.  3.  After you shampoo, rinse your hair and body thoroughly to remove the  shampoo.  4.  Use CHG as you would any other liquid soap.  You can apply chg directly  to the skin and wash                       Gently with a scrungie or clean washcloth.  5.  Apply the CHG Soap to your body ONLY FROM THE NECK DOWN.   Do not use on face/ open                           Wound or open sores. Avoid contact with eyes, ears mouth and genitals (private parts).                       Wash face,  Genitals (private parts) with your normal soap.             6.  Wash thoroughly, paying special attention to the area where your surgery  will be performed.  7.  Thoroughly rinse your body with warm water from the neck down.  8.  DO NOT shower/wash with your normal soap after using and rinsing off  the CHG Soap.                9.  Pat yourself dry with a clean towel.            10.  Wear clean pajamas.            11.  Place clean sheets on your bed the night of your first shower and do not  sleep with pets. Day of Surgery : Do not apply any lotions/deodorants the morning of surgery.  Please wear clean clothes to the hospital/surgery center.  FAILURE TO FOLLOW THESE INSTRUCTIONS MAY RESULT IN THE CANCELLATION OF YOUR SURGERY PATIENT SIGNATURE_________________________________  NURSE SIGNATURE__________________________________  ________________________________________________________________________  WHAT IS A BLOOD TRANSFUSION? Blood Transfusion  Information  A transfusion is the replacement of blood or some of its parts. Blood is made up of multiple cells which provide different functions.  Red blood cells carry oxygen and are used for blood loss replacement.  White blood cells fight against infection.  Platelets control bleeding.  Plasma helps clot blood.  Other blood products are available for specialized needs, such as hemophilia or other clotting disorders. BEFORE THE TRANSFUSION  Who gives blood for transfusions?   Healthy volunteers who are fully evaluated to make sure their blood is safe. This is blood bank blood. Transfusion therapy is the safest it has ever been in the practice of medicine. Before blood is taken from a donor, a complete history is taken to make sure that person has no history of diseases nor engages in risky social behavior (examples are intravenous drug use or sexual activity with multiple partners). The donor's travel history is screened to minimize risk of transmitting infections, such as malaria. The donated blood is tested for signs of infectious diseases, such as HIV and hepatitis. The blood is then tested to be sure it is compatible with you in order to minimize the chance of a transfusion reaction. If you or a relative donates blood, this is often done in anticipation of surgery and is not appropriate for emergency situations. It takes many days to process the donated blood. RISKS AND COMPLICATIONS Although transfusion therapy is very safe and saves many lives, the main dangers of transfusion include:  1. Getting an infectious disease. 2. Developing a transfusion reaction. This  is an allergic reaction to something in the blood you were given. Every precaution is taken to prevent this. The decision to have a blood transfusion has been considered carefully by your caregiver before blood is given. Blood is not given unless the benefits outweigh the risks. AFTER THE TRANSFUSION  Right after receiving a  blood transfusion, you will usually feel much better and more energetic. This is especially true if your red blood cells have gotten low (anemic). The transfusion raises the level of the red blood cells which carry oxygen, and this usually causes an energy increase.  The nurse administering the transfusion will monitor you carefully for complications. HOME CARE INSTRUCTIONS  No special instructions are needed after a transfusion. You may find your energy is better. Speak with your caregiver about any limitations on activity for underlying diseases you may have. SEEK MEDICAL CARE IF:   Your condition is not improving after your transfusion.  You develop redness or irritation at the intravenous (IV) site. SEEK IMMEDIATE MEDICAL CARE IF:  Any of the following symptoms occur over the next 12 hours:  Shaking chills.  You have a temperature by mouth above 102 F (38.9 C), not controlled by medicine.  Chest, back, or muscle pain.  People around you feel you are not acting correctly or are confused.  Shortness of breath or difficulty breathing.  Dizziness and fainting.  You get a rash or develop hives.  You have a decrease in urine output.  Your urine turns a dark color or changes to pink, red, or brown. Any of the following symptoms occur over the next 10 days:  You have a temperature by mouth above 102 F (38.9 C), not controlled by medicine.  Shortness of breath.  Weakness after normal activity.  The white part of the eye turns yellow (jaundice).  You have a decrease in the amount of urine or are urinating less often.  Your urine turns a dark color or changes to pink, red, or brown. Document Released: 01/03/2000 Document Revised: 03/30/2011 Document Reviewed: 08/22/2007 [employer] Patient Information 2014 [place].  _______________________________________________________________________  Incentive Spirometer  An incentive spirometer is a tool that can help keep  your lungs clear and active. This tool measures how well you are filling your lungs with each breath. Taking long deep breaths may help reverse or decrease the chance of developing breathing (pulmonary) problems (especially infection) following:  A long period of time when you are unable to move or be active. BEFORE THE PROCEDURE   If the spirometer includes an indicator to show your best effort, your nurse or respiratory therapist will set it to a desired goal.  If possible, sit up straight or lean slightly forward. Try not to slouch.  Hold the incentive spirometer in an upright position. INSTRUCTIONS FOR USE  3. Sit on the edge of your bed if possible, or sit up as far as you can in bed or on a chair. 4. Hold the incentive spirometer in an upright position. 5. Breathe out normally. 6. Place the mouthpiece in your mouth and seal your lips tightly around it. 7. Breathe in slowly and as deeply as possible, raising the piston or the ball toward the top of the column. 8. Hold your breath for 3-5 seconds or for as long as possible. Allow the piston or ball to fall to the bottom of the column. 9. Remove the mouthpiece from your mouth and breathe out normally. 10. Rest for a few seconds and repeat Steps 1  through 7 at least 10 times every 1-2 hours when you are awake. Take your time and take a few normal breaths between deep breaths. 11. The spirometer may include an indicator to show your best effort. Use the indicator as a goal to work toward during each repetition. 12. After each set of 10 deep breaths, practice coughing to be sure your lungs are clear. If you have an incision (the cut made at the time of surgery), support your incision when coughing by placing a pillow or rolled up towels firmly against it. Once you are able to get out of bed, walk around indoors and cough well. You may stop using the incentive spirometer when instructed by your caregiver.  RISKS AND COMPLICATIONS  Take your  time so you do not get dizzy or light-headed.  If you are in pain, you may need to take or ask for pain medication before doing incentive spirometry. It is harder to take a deep breath if you are having pain. AFTER USE  Rest and breathe slowly and easily.  It can be helpful to keep track of a log of your progress. Your caregiver can provide you with a simple table to help with this. If you are using the spirometer at home, follow these instructions: [place] IF:   You are having difficultly using the spirometer.  You have trouble using the spirometer as often as instructed.  Your pain medication is not giving enough relief while using the spirometer.  You develop fever of 100.5 F (38.1 C) or higher. SEEK IMMEDIATE MEDICAL CARE IF:   You cough up bloody sputum that had not been present before.  You develop fever of 102 F (38.9 C) or greater.  You develop worsening pain at or near the incision site. MAKE SURE YOU:   Understand these instructions.  Will watch your condition.  Will get help right away if you are not doing well or get worse. Document Released: 05/18/2006 Document Revised: 03/30/2011 Document Reviewed: 07/19/2006 [facility] Patient Information 2014 [place], [place].   ________________________________________________________________________

## 2014-11-02 ENCOUNTER — Encounter (HOSPITAL_COMMUNITY): Payer: Self-pay

## 2014-11-02 ENCOUNTER — Encounter (HOSPITAL_COMMUNITY)
Admission: RE | Admit: 2014-11-02 | Discharge: 2014-11-02 | Disposition: A | Discharge status: Home / Self Care | Payer: Medicare Other | Source: Ambulatory Visit | Attending: Orthopedic Surgery | Admitting: Orthopedic Surgery

## 2014-11-02 DIAGNOSIS — Z01818 Encounter for other preprocedural examination: Secondary | ICD-10-CM | POA: Insufficient documentation

## 2014-11-02 DIAGNOSIS — M179 Osteoarthritis of knee, unspecified: Secondary | ICD-10-CM | POA: Diagnosis not present

## 2014-11-02 HISTORY — DX: Adverse effect of unspecified anesthetic, initial encounter: T41.45XA

## 2014-11-02 HISTORY — DX: Other complications of anesthesia, initial encounter: T88.59XA

## 2014-11-02 LAB — CBC
HEMATOCRIT: 37 % (ref 36.0–46.0)
HEMOGLOBIN: 11.6 g/dL — AB (ref 12.0–15.0)
MCH: 27.3 pg (ref 26.0–34.0)
MCHC: 31.4 g/dL (ref 30.0–36.0)
MCV: 87.1 fL (ref 78.0–100.0)
Platelets: 226 10*3/uL (ref 150–400)
RBC: 4.25 MIL/uL (ref 3.87–5.11)
RDW: 15.6 % — ABNORMAL HIGH (ref 11.5–15.5)
WBC: 5.7 10*3/uL (ref 4.0–10.5)

## 2014-11-02 LAB — BASIC METABOLIC PANEL
ANION GAP: 4 — AB (ref 5–15)
BUN: 15 mg/dL (ref 6–20)
CALCIUM: 9.1 mg/dL (ref 8.9–10.3)
CHLORIDE: 105 mmol/L (ref 101–111)
CO2: 27 mmol/L (ref 22–32)
Creatinine, Ser: 0.92 mg/dL (ref 0.44–1.00)
GFR calc non Af Amer: 56 mL/min — ABNORMAL LOW (ref >60)
GLUCOSE: 82 mg/dL (ref 65–99)
POTASSIUM: 4.9 mmol/L (ref 3.5–5.1)
Sodium: 136 mmol/L (ref 135–145)

## 2014-11-02 LAB — TYPE AND SCREEN
ABO/RH(D): A POS
ANTIBODY SCREEN: NEGATIVE

## 2014-11-02 LAB — PROTIME-INR
INR: 1.01 (ref 0.00–1.49)
PROTHROMBIN TIME: 13.5 s (ref 11.6–15.2)

## 2014-11-02 LAB — URINALYSIS, ROUTINE W REFLEX MICROSCOPIC
BILIRUBIN URINE: NEGATIVE
Glucose, UA: NEGATIVE mg/dL
Hgb urine dipstick: NEGATIVE
KETONES UR: NEGATIVE mg/dL
NITRITE: NEGATIVE
PH: 7.5 (ref 5.0–8.0)
PROTEIN: NEGATIVE mg/dL
Specific Gravity, Urine: 1.014 (ref 1.005–1.030)
UROBILINOGEN UA: 0.2 mg/dL (ref 0.0–1.0)

## 2014-11-02 LAB — SURGICAL PCR SCREEN
MRSA, PCR: NEGATIVE
STAPHYLOCOCCUS AUREUS: NEGATIVE

## 2014-11-02 LAB — URINE MICROSCOPIC-ADD ON

## 2014-11-02 LAB — APTT: aPTT: 29 seconds (ref 24–37)

## 2014-11-02 NOTE — Pre-Procedure Instructions (Addendum)
EKG to be done today d/t pt hx of HTN. Instructed pt to follow Dr. Aurea Graff instruction regarding discontinuing Vitamin D prior to surgery. UA shows trace amount of leukocytes.  Routed results to Dr. Alvan Dame.

## 2014-11-08 NOTE — H&P (Signed)
TOTAL KNEE ADMISSION H&P  Patient is being admitted for left total knee arthroplasty.  Subjective:  Chief Complaint:     Left knee primary OA / pain  HPI: [name] [name], 81 y.o. female, has a history of pain and functional disability in the left knee due to arthritis and has failed non-surgical conservative treatments for greater than 12 weeks to include NSAID's and/or analgesics, corticosteriod injections, viscosupplementation injections, use of assistive devices and activity modification.  Onset of symptoms was gradual, starting 20+ years ago with gradually worsening course since that time. The patient noted no past surgery on the left knee(s).  Patient currently rates pain in the left knee(s) at 10 out of 10 with activity. Patient has night pain, worsening of pain with activity and weight bearing, pain that interferes with activities of daily living, pain with passive range of motion, crepitus and joint swelling.  Patient has evidence of periarticular osteophytes and joint space narrowing by imaging studies.   There is no active infection.  Risks, benefits and expectations were discussed with the patient.  Risks including but not limited to the risk of anesthesia, blood clots, nerve damage, blood vessel damage, failure of the prosthesis, infection and up to and including death.  Patient understand the risks, benefits and expectations and wishes to proceed with surgery.   PCP: [name], MD  D/C Plans:      SNF  Post-op Meds:       No Rx given   Tranexamic Acid:      To be given - IV  Decadron:      Is to be given  FYI:     ASA post-op  Norco post-op    Patient Active Problem List   Diagnosis Date Noted  . Overweight (BMI 25.0-29.9) 07/15/2012  . Expected blood loss anemia 07/15/2012  . S/P right TK revision 07/14/2012  . Ankylosis of knee joint 04/03/2011  . Fibrosis of knee joint 01/22/2011  . Chest pain 10/20/2010  . Bacterial overgrowth syndrome 10/09/2010  . Status post  right hemicolectomy 10/09/2010  . Esophageal dysphagia 10/09/2010  . B12 deficiency 10/09/2010  . DYSPHAGIA UNSPECIFIED 01/23/2010  . BLIND LOOP SYNDROME 08/26/2009  . GASTRITIS 12/26/2008  . WEIGHT LOSS-ABNORMAL 12/25/2008  . DYSPHAGIA 12/25/2008  . DIARRHEA 07/17/2008  . HOARSENESS 07/26/2007  . B12 DEFICIENCY 07/25/2007  . HYPERTENSION 07/25/2007  . MITRAL VALVE PROLAPSE 07/25/2007  . PREMATURE VENTRICULAR CONTRACTIONS 07/25/2007  . ESOPHAGITIS, REFLUX 07/25/2007  . GERD 07/25/2007  . GASTRITIS, CHRONIC 07/25/2007  . HIATAL HERNIA 07/25/2007  . ABSCESS OF INTESTINE 07/25/2007   Past Medical History  Diagnosis Date  . Hypertension   . GERD (gastroesophageal reflux disease)   . MVP (mitral valve prolapse)   . PVC's (premature ventricular contractions)   . Hiatal hernia   . Anemia   . Fatty liver   . Blind loop syndrome   . Schatzki's ring   . Vitamin B12 deficiency   . 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 Allergy     SEASONAL  . Arthritis     KNEES/HANDS  . Anxiety   . Complication of anesthesia     pt can't remember exact complication, but she remembers about 30 years ago she may have had trouble waking up from anesthesia.    Past Surgical History  Procedure Laterality Date  . Hemicolectomy      and ileectomy Dr Vida Rigger; due to perforated viscus  . Rotator cuff repair  03-26-11  right arm  . Nuclear stress test  Sept 2012    Normal  . Replacement total knee      right  . Knee closed reduction  01/22/2011    Procedure: CLOSED MANIPULATION KNEE;  Surgeon: Mcarthur Rossetti;  Location: [facility];  Service: Orthopedics;  Laterality: Right;  Manipulation under anesthesia right knee  . Cataract extraction, bilateral  03-26-11  . I&d knee with poly exchange  04/03/2011    Procedure: IRRIGATION AND DEBRIDEMENT KNEE WITH POLY EXCHANGE;  Surgeon: Mcarthur Rossetti, MD;  Location: [facility];  Service: Orthopedics;  Laterality: Right;  . Synovectomy   04/03/2011    Procedure: SYNOVECTOMY;  Surgeon: Mcarthur Rossetti, MD;  Location: [facility];  Service: Orthopedics;  Laterality: Right;  . Tubal ligation    . Total knee revision Right 07/14/2012    Procedure: RIGHT TOTAL KNEE REVISION;  Surgeon: Mauri Pole, MD;  Location: [facility];  Service: Orthopedics;  Laterality: Right;    No prescriptions prior to admission   No Known Allergies   Social History  Substance Use Topics  . Smoking status: Never Smoker   . Smokeless tobacco: Never Used  . Alcohol Use: No    Family History  Problem Relation Age of Onset  . Hypertension Sister   . Diabetes Sister   . Diabetes Brother   . Coronary artery disease Brother   . Deep vein thrombosis Sister   . Colon cancer Neg Hx   . Colon polyps Neg Hx      Review of Systems  Constitutional: Negative.   HENT: Negative.   Eyes: Negative.   Respiratory: Negative.   Cardiovascular: Negative.   Gastrointestinal: Positive for heartburn.  Genitourinary: Negative.   Musculoskeletal: Positive for joint pain.  Skin: Negative.   Neurological: Negative.   Endo/Heme/Allergies: Positive for environmental allergies.  Psychiatric/Behavioral: The patient is nervous/anxious.     Objective:  Physical Exam  Constitutional: She is oriented to person, place, and time. She appears well-developed.  HENT:  Head: Normocephalic.  Eyes: Pupils are equal, round, and reactive to light.  Neck: Neck supple. No JVD present. No tracheal deviation present. No thyromegaly present.  Cardiovascular: Normal rate, regular rhythm and intact distal pulses.   Respiratory: Effort normal and breath sounds normal. No stridor. No respiratory distress. She has no wheezes.  GI: Soft. There is no tenderness. There is no guarding.  Musculoskeletal:       Left knee: She exhibits decreased range of motion, swelling and bony tenderness. She exhibits no ecchymosis, no deformity, no laceration and no erythema. Tenderness found.   Lymphadenopathy:    She has no cervical adenopathy.  Neurological: She is alert and oriented to person, place, and time.  Skin: Skin is warm and dry.  Psychiatric: She has a normal mood and affect.      Labs:  Estimated body mass index is 28.46 kg/(m^2) as calculated from the following:   Height as of 06/05/13: 5\' 5"  (1.651 m).   Weight as of 06/05/13: 77.565 kg (171 lb).   Imaging Review Plain radiographs demonstrate severe degenerative joint disease of the left knee(s).  The bone quality appears to be good for age and reported activity level.  Assessment/Plan:  End stage arthritis, left knee   The patient history, physical examination, clinical judgment of the provider and imaging studies are consistent with end stage degenerative joint disease of the left knee(s) and total knee arthroplasty is deemed medically necessary. The treatment options including medical management,  injection therapy arthroscopy and arthroplasty were discussed at length. The risks and benefits of total knee arthroplasty were presented and reviewed. The risks due to aseptic loosening, infection, stiffness, patella tracking problems, thromboembolic complications and other imponderables were discussed. The patient acknowledged the explanation, agreed to proceed with the plan and consent was signed. Patient is being admitted for inpatient treatment for surgery, pain control, PT, OT, prophylactic antibiotics, VTE prophylaxis, progressive ambulation and ADL's and discharge planning. The patient is planning to be discharged to skilled nursing facility.     [place] Kamaria Lucia   PA-C  11/08/2014, 11:36 AM

## 2014-11-13 ENCOUNTER — Inpatient Hospital Stay (HOSPITAL_COMMUNITY): Payer: Medicare Other | Admitting: Anesthesiology

## 2014-11-13 ENCOUNTER — Inpatient Hospital Stay (HOSPITAL_COMMUNITY)
Admission: RE | Admit: 2014-11-13 | Discharge: 2014-11-15 | DRG: 470 | Disposition: A | Discharge status: Skilled Nursing Facility | Payer: Medicare Other | Source: Ambulatory Visit | Attending: Orthopedic Surgery | Admitting: Orthopedic Surgery

## 2014-11-13 ENCOUNTER — Encounter (HOSPITAL_COMMUNITY)
Admission: RE | Disposition: A | Discharge status: Skilled Nursing Facility | Payer: Self-pay | Source: Ambulatory Visit | Attending: Orthopedic Surgery

## 2014-11-13 ENCOUNTER — Encounter (HOSPITAL_COMMUNITY): Payer: Self-pay | Admitting: *Deleted

## 2014-11-13 DIAGNOSIS — Z6825 Body mass index (BMI) 25.0-25.9, adult: Secondary | ICD-10-CM | POA: Diagnosis not present

## 2014-11-13 DIAGNOSIS — Z9049 Acquired absence of other specified parts of digestive tract: Secondary | ICD-10-CM

## 2014-11-13 DIAGNOSIS — K449 Diaphragmatic hernia without obstruction or gangrene: Secondary | ICD-10-CM | POA: Diagnosis present

## 2014-11-13 DIAGNOSIS — M25562 Pain in left knee: Secondary | ICD-10-CM | POA: Diagnosis present

## 2014-11-13 DIAGNOSIS — M659 Synovitis and tenosynovitis, unspecified: Secondary | ICD-10-CM | POA: Diagnosis present

## 2014-11-13 DIAGNOSIS — I10 Essential (primary) hypertension: Secondary | ICD-10-CM | POA: Diagnosis present

## 2014-11-13 DIAGNOSIS — K219 Gastro-esophageal reflux disease without esophagitis: Secondary | ICD-10-CM | POA: Diagnosis present

## 2014-11-13 DIAGNOSIS — Z96651 Presence of right artificial knee joint: Secondary | ICD-10-CM | POA: Diagnosis present

## 2014-11-13 DIAGNOSIS — F419 Anxiety disorder, unspecified: Secondary | ICD-10-CM | POA: Diagnosis present

## 2014-11-13 DIAGNOSIS — M1712 Unilateral primary osteoarthritis, left knee: Principal | ICD-10-CM | POA: Diagnosis present

## 2014-11-13 DIAGNOSIS — I341 Nonrheumatic mitral (valve) prolapse: Secondary | ICD-10-CM | POA: Diagnosis present

## 2014-11-13 DIAGNOSIS — E663 Overweight: Secondary | ICD-10-CM | POA: Diagnosis present

## 2014-11-13 DIAGNOSIS — Z01812 Encounter for preprocedural laboratory examination: Secondary | ICD-10-CM | POA: Diagnosis not present

## 2014-11-13 DIAGNOSIS — Z96652 Presence of left artificial knee joint: Secondary | ICD-10-CM

## 2014-11-13 DIAGNOSIS — Z96659 Presence of unspecified artificial knee joint: Secondary | ICD-10-CM

## 2014-11-13 HISTORY — PX: TOTAL KNEE ARTHROPLASTY: SHX125

## 2014-11-13 SURGERY — ARTHROPLASTY, KNEE, TOTAL
Anesthesia: Spinal | Site: Knee | Laterality: Left

## 2014-11-13 MED ORDER — BISACODYL 10 MG RE SUPP
10.0000 mg | Freq: Every day | RECTAL | Status: DC | PRN
Start: 2014-11-13 — End: 2014-11-15

## 2014-11-13 MED ORDER — CEFAZOLIN SODIUM-DEXTROSE 2-3 GM-% IV SOLR
INTRAVENOUS | Status: AC
  Filled 2014-11-13: qty 50

## 2014-11-13 MED ORDER — ONDANSETRON HCL 4 MG/2ML IJ SOLN: 4.0000 mg | Freq: Four times a day (QID) | INTRAMUSCULAR | Status: DC | PRN

## 2014-11-13 MED ORDER — DOCUSATE SODIUM 100 MG PO CAPS
100.0000 mg | ORAL_CAPSULE | Freq: Two times a day (BID) | ORAL | Status: DC
  Administered 2014-11-13 – 2014-11-15 (×4): 100 mg via ORAL

## 2014-11-13 MED ORDER — HYDROMORPHONE HCL 1 MG/ML IJ SOLN
0.5000 mg | INTRAMUSCULAR | Status: DC | PRN
  Administered 2014-11-13: 1 mg via INTRAVENOUS
  Filled 2014-11-13: qty 1

## 2014-11-13 MED ORDER — 0.9 % SODIUM CHLORIDE (POUR BTL) OPTIME
TOPICAL | Status: DC | PRN
  Administered 2014-11-13: 1000 mL

## 2014-11-13 MED ORDER — BUPIVACAINE IN DEXTROSE 0.75-8.25 % IT SOLN
INTRATHECAL | Status: DC | PRN
  Administered 2014-11-13: 1.8 mL via INTRATHECAL

## 2014-11-13 MED ORDER — SODIUM CHLORIDE 0.9 % IJ SOLN
INTRAMUSCULAR | Status: DC | PRN
  Administered 2014-11-13: 30 mL

## 2014-11-13 MED ORDER — HYDROMORPHONE HCL 1 MG/ML IJ SOLN
INTRAMUSCULAR | Status: AC
  Filled 2014-11-13: qty 1

## 2014-11-13 MED ORDER — KETOROLAC TROMETHAMINE 30 MG/ML IJ SOLN
INTRAMUSCULAR | Status: AC
  Filled 2014-11-13: qty 1

## 2014-11-13 MED ORDER — ALPRAZOLAM 0.25 MG PO TABS: 0.2500 mg | ORAL_TABLET | Freq: Four times a day (QID) | ORAL | Status: DC | PRN

## 2014-11-13 MED ORDER — CEFAZOLIN SODIUM-DEXTROSE 2-3 GM-% IV SOLR
2.0000 g | INTRAVENOUS | Status: AC
  Administered 2014-11-13: 2 g via INTRAVENOUS

## 2014-11-13 MED ORDER — PROPOFOL 10 MG/ML IV BOLUS
INTRAVENOUS | Status: DC | PRN
  Administered 2014-11-13: 100 mg via INTRAVENOUS
  Administered 2014-11-13 (×2): 20 mg via INTRAVENOUS
  Administered 2014-11-13: 10 mg via INTRAVENOUS

## 2014-11-13 MED ORDER — AMLODIPINE BESYLATE 2.5 MG PO TABS
2.5000 mg | ORAL_TABLET | Freq: Every morning | ORAL | Status: DC
  Administered 2014-11-14 – 2014-11-15 (×2): 2.5 mg via ORAL
  Filled 2014-11-13 (×2): qty 1

## 2014-11-13 MED ORDER — MAGNESIUM CITRATE PO SOLN: 1.0000 | Freq: Once | ORAL | Status: DC | PRN

## 2014-11-13 MED ORDER — METOCLOPRAMIDE HCL 5 MG/ML IJ SOLN: 5.0000 mg | Freq: Three times a day (TID) | INTRAMUSCULAR | Status: DC | PRN

## 2014-11-13 MED ORDER — PROPOFOL 10 MG/ML IV BOLUS
INTRAVENOUS | Status: AC
  Filled 2014-11-13: qty 20

## 2014-11-13 MED ORDER — FENTANYL CITRATE (PF) 100 MCG/2ML IJ SOLN
INTRAMUSCULAR | Status: AC
  Filled 2014-11-13: qty 4

## 2014-11-13 MED ORDER — CHLORHEXIDINE GLUCONATE 4 % EX LIQD: 60.0000 mL | Freq: Once | CUTANEOUS | Status: DC

## 2014-11-13 MED ORDER — METHOCARBAMOL 1000 MG/10ML IJ SOLN
500.0000 mg | Freq: Four times a day (QID) | INTRAMUSCULAR | Status: DC | PRN
  Administered 2014-11-13: 500 mg via INTRAVENOUS
  Filled 2014-11-13 (×2): qty 5

## 2014-11-13 MED ORDER — ONDANSETRON HCL 4 MG/2ML IJ SOLN: 4.0000 mg | Freq: Once | INTRAMUSCULAR | Status: DC | PRN

## 2014-11-13 MED ORDER — KETOROLAC TROMETHAMINE 30 MG/ML IJ SOLN
INTRAMUSCULAR | Status: DC | PRN
  Administered 2014-11-13: 30 mg

## 2014-11-13 MED ORDER — FERROUS SULFATE 325 (65 FE) MG PO TABS
325.0000 mg | ORAL_TABLET | Freq: Three times a day (TID) | ORAL | Status: DC
Start: 2014-11-13 — End: 2014-11-15
  Administered 2014-11-13 – 2014-11-15 (×4): 325 mg via ORAL
  Filled 2014-11-13 (×8): qty 1

## 2014-11-13 MED ORDER — SODIUM CHLORIDE 0.9 % IV SOLN
INTRAVENOUS | Status: DC
  Administered 2014-11-13 – 2014-11-14 (×2): via INTRAVENOUS
  Filled 2014-11-13 (×6): qty 1000

## 2014-11-13 MED ORDER — LORATADINE 10 MG PO TABS
10.0000 mg | ORAL_TABLET | Freq: Every day | ORAL | Status: DC | PRN
  Filled 2014-11-13: qty 1

## 2014-11-13 MED ORDER — HYDROMORPHONE HCL 1 MG/ML IJ SOLN
0.2500 mg | INTRAMUSCULAR | Status: DC | PRN
  Administered 2014-11-13 (×2): 0.5 mg via INTRAVENOUS

## 2014-11-13 MED ORDER — DEXAMETHASONE SODIUM PHOSPHATE 10 MG/ML IJ SOLN
10.0000 mg | Freq: Once | INTRAMUSCULAR | Status: AC
  Administered 2014-11-13: 10 mg via INTRAVENOUS

## 2014-11-13 MED ORDER — BUPIVACAINE-EPINEPHRINE (PF) 0.25% -1:200000 IJ SOLN
INTRAMUSCULAR | Status: DC | PRN
  Administered 2014-11-13: 30 mL

## 2014-11-13 MED ORDER — METHOCARBAMOL 500 MG PO TABS
500.0000 mg | ORAL_TABLET | Freq: Four times a day (QID) | ORAL | Status: DC | PRN
  Administered 2014-11-13: 500 mg via ORAL
  Filled 2014-11-13: qty 1

## 2014-11-13 MED ORDER — MENTHOL 3 MG MT LOZG: 1.0000 | LOZENGE | OROMUCOSAL | Status: DC | PRN

## 2014-11-13 MED ORDER — DEXAMETHASONE SODIUM PHOSPHATE 10 MG/ML IJ SOLN
INTRAMUSCULAR | Status: AC
  Filled 2014-11-13: qty 1

## 2014-11-13 MED ORDER — OXYCODONE HCL 5 MG/5ML PO SOLN
5.0000 mg | Freq: Once | ORAL | Status: DC | PRN
  Filled 2014-11-13: qty 5

## 2014-11-13 MED ORDER — ONDANSETRON HCL 4 MG PO TABS: 4.0000 mg | ORAL_TABLET | Freq: Four times a day (QID) | ORAL | Status: DC | PRN

## 2014-11-13 MED ORDER — SODIUM CHLORIDE 0.9 % IR SOLN
Status: DC | PRN
  Administered 2014-11-13: 1000 mL

## 2014-11-13 MED ORDER — HYDROCODONE-ACETAMINOPHEN 7.5-325 MG PO TABS
1.0000 | ORAL_TABLET | ORAL | Status: DC
  Administered 2014-11-13 – 2014-11-15 (×9): 2 via ORAL
  Administered 2014-11-15: 1 via ORAL
  Administered 2014-11-15: 2 via ORAL
  Filled 2014-11-13 (×11): qty 2

## 2014-11-13 MED ORDER — CEFAZOLIN SODIUM-DEXTROSE 2-3 GM-% IV SOLR
2.0000 g | Freq: Four times a day (QID) | INTRAVENOUS | Status: AC
  Administered 2014-11-13 (×2): 2 g via INTRAVENOUS
  Filled 2014-11-13 (×2): qty 50

## 2014-11-13 MED ORDER — PHENOL 1.4 % MT LIQD: 1.0000 | OROMUCOSAL | Status: DC | PRN

## 2014-11-13 MED ORDER — CELECOXIB 200 MG PO CAPS
200.0000 mg | ORAL_CAPSULE | Freq: Two times a day (BID) | ORAL | Status: DC
Start: 2014-11-13 — End: 2014-11-15
  Administered 2014-11-13 – 2014-11-15 (×4): 200 mg via ORAL
  Filled 2014-11-13 (×5): qty 1

## 2014-11-13 MED ORDER — ONDANSETRON HCL 4 MG/2ML IJ SOLN
INTRAMUSCULAR | Status: AC
  Filled 2014-11-13: qty 2

## 2014-11-13 MED ORDER — DEXAMETHASONE SODIUM PHOSPHATE 10 MG/ML IJ SOLN
10.0000 mg | Freq: Once | INTRAMUSCULAR | Status: AC
  Administered 2014-11-14: 10 mg via INTRAVENOUS
  Filled 2014-11-13: qty 1

## 2014-11-13 MED ORDER — DIPHENHYDRAMINE HCL 25 MG PO CAPS: 25.0000 mg | ORAL_CAPSULE | Freq: Four times a day (QID) | ORAL | Status: DC | PRN

## 2014-11-13 MED ORDER — ALUM & MAG HYDROXIDE-SIMETH 200-200-20 MG/5ML PO SUSP: 30.0000 mL | ORAL | Status: DC | PRN

## 2014-11-13 MED ORDER — OXYCODONE HCL 5 MG PO TABS: 5.0000 mg | ORAL_TABLET | Freq: Once | ORAL | Status: DC | PRN

## 2014-11-13 MED ORDER — POLYETHYLENE GLYCOL 3350 17 G PO PACK
17.0000 g | PACK | Freq: Two times a day (BID) | ORAL | Status: DC
  Administered 2014-11-13 – 2014-11-15 (×4): 17 g via ORAL

## 2014-11-13 MED ORDER — TRANEXAMIC ACID 1000 MG/10ML IV SOLN
1000.0000 mg | Freq: Once | INTRAVENOUS | Status: AC
  Administered 2014-11-13: 1000 mg via INTRAVENOUS
  Filled 2014-11-13: qty 10

## 2014-11-13 MED ORDER — LACTATED RINGERS IV SOLN
INTRAVENOUS | Status: AC
  Administered 2014-11-13: 13:00:00 via INTRAVENOUS
  Administered 2014-11-13: 1000 mL via INTRAVENOUS

## 2014-11-13 MED ORDER — ONDANSETRON HCL 4 MG/2ML IJ SOLN
INTRAMUSCULAR | Status: DC | PRN
  Administered 2014-11-13: 4 mg via INTRAVENOUS

## 2014-11-13 MED ORDER — METOCLOPRAMIDE HCL 10 MG PO TABS: 5.0000 mg | ORAL_TABLET | Freq: Three times a day (TID) | ORAL | Status: DC | PRN

## 2014-11-13 MED ORDER — SODIUM CHLORIDE 0.9 % IJ SOLN
INTRAMUSCULAR | Status: AC
  Filled 2014-11-13: qty 50

## 2014-11-13 MED ORDER — PROPOFOL 500 MG/50ML IV EMUL
INTRAVENOUS | Status: DC | PRN
  Administered 2014-11-13: 50 ug/kg/min via INTRAVENOUS

## 2014-11-13 MED ORDER — FENTANYL CITRATE (PF) 100 MCG/2ML IJ SOLN
INTRAMUSCULAR | Status: DC | PRN
  Administered 2014-11-13: 50 ug via INTRAVENOUS
  Administered 2014-11-13: 25 ug via INTRAVENOUS
  Administered 2014-11-13: 50 ug via INTRAVENOUS
  Administered 2014-11-13: 25 ug via INTRAVENOUS
  Administered 2014-11-13: 50 ug via INTRAVENOUS

## 2014-11-13 MED ORDER — ASPIRIN EC 325 MG PO TBEC
325.0000 mg | DELAYED_RELEASE_TABLET | Freq: Two times a day (BID) | ORAL | Status: DC
  Administered 2014-11-14 – 2014-11-15 (×3): 325 mg via ORAL
  Filled 2014-11-13 (×5): qty 1

## 2014-11-13 MED ORDER — BUPIVACAINE-EPINEPHRINE (PF) 0.25% -1:200000 IJ SOLN
INTRAMUSCULAR | Status: AC
  Filled 2014-11-13: qty 30

## 2014-11-13 SURGICAL SUPPLY — 60 items
BAG DECANTER FOR FLEXI CONT (MISCELLANEOUS) IMPLANT
BAG ZIPLOCK 12X15 (MISCELLANEOUS) ×3 IMPLANT
BANDAGE ELASTIC 6 VELCRO ST LF (GAUZE/BANDAGES/DRESSINGS) ×3 IMPLANT
BANDAGE ESMARK 6X9 LF (GAUZE/BANDAGES/DRESSINGS) ×1 IMPLANT
BLADE SAW SGTL 13.0X1.19X90.0M (BLADE) ×3 IMPLANT
BNDG ESMARK 6X9 LF (GAUZE/BANDAGES/DRESSINGS) ×3
BOWL SMART MIX CTS (DISPOSABLE) ×3 IMPLANT
CAP KNEE TOTAL 3 SIGMA ×3 IMPLANT
CEMENT HV SMART SET (Cement) ×6 IMPLANT
CUFF TOURN SGL QUICK 34 (TOURNIQUET CUFF) ×2
CUFF TRNQT CYL 34X4X40X1 (TOURNIQUET CUFF) ×1 IMPLANT
DECANTER SPIKE VIAL GLASS SM (MISCELLANEOUS) IMPLANT
DRAPE EXTREMITY T 121X128X90 (DRAPE) ×3 IMPLANT
DRAPE POUCH INSTRU U-SHP 10X18 (DRAPES) ×3 IMPLANT
DRAPE U-SHAPE 47X51 STRL (DRAPES) ×3 IMPLANT
DRSG AQUACEL AG ADV 3.5X10 (GAUZE/BANDAGES/DRESSINGS) ×3 IMPLANT
DURAPREP 26ML APPLICATOR (WOUND CARE) ×6 IMPLANT
ELECT REM PT RETURN 9FT ADLT (ELECTROSURGICAL) ×3
ELECTRODE REM PT RTRN 9FT ADLT (ELECTROSURGICAL) ×1 IMPLANT
FACESHIELD WRAPAROUND (MASK) ×15 IMPLANT
GLOVE BIOGEL M 7.0 STRL (GLOVE) ×3 IMPLANT
GLOVE BIOGEL PI IND STRL 7.0 (GLOVE) ×1 IMPLANT
GLOVE BIOGEL PI IND STRL 7.5 (GLOVE) ×4 IMPLANT
GLOVE BIOGEL PI IND STRL 8.5 (GLOVE) IMPLANT
GLOVE BIOGEL PI INDICATOR 7.0 (GLOVE) ×2
GLOVE BIOGEL PI INDICATOR 7.5 (GLOVE) ×8
GLOVE BIOGEL PI INDICATOR 8.5 (GLOVE)
GLOVE ECLIPSE 8.0 STRL XLNG CF (GLOVE) IMPLANT
GLOVE ORTHO TXT STRL SZ7.5 (GLOVE) ×6 IMPLANT
GLOVE SURG SS PI 7.5 STRL IVOR (GLOVE) ×6 IMPLANT
GOWN SPEC L3 XXLG W/TWL (GOWN DISPOSABLE) IMPLANT
GOWN STRL NON-REIN LRG LVL3 (GOWN DISPOSABLE) ×3 IMPLANT
GOWN STRL REUS W/ TWL XL LVL3 (GOWN DISPOSABLE) ×2 IMPLANT
GOWN STRL REUS W/TWL LRG LVL3 (GOWN DISPOSABLE) ×3 IMPLANT
GOWN STRL REUS W/TWL XL LVL3 (GOWN DISPOSABLE) ×4
HANDPIECE INTERPULSE COAX TIP (DISPOSABLE) ×2
KIT BASIN OR (CUSTOM PROCEDURE TRAY) ×3 IMPLANT
LIQUID BAND (GAUZE/BANDAGES/DRESSINGS) ×3 IMPLANT
MANIFOLD NEPTUNE II (INSTRUMENTS) ×3 IMPLANT
NDL SAFETY ECLIPSE 18X1.5 (NEEDLE) ×1 IMPLANT
NEEDLE HYPO 18GX1.5 SHARP (NEEDLE) ×2
PACK TOTAL JOINT (CUSTOM PROCEDURE TRAY) ×3 IMPLANT
PEN SKIN MARKING BROAD (MISCELLANEOUS) ×3 IMPLANT
POSITIONER SURGICAL ARM (MISCELLANEOUS) ×3 IMPLANT
SET HNDPC FAN SPRY TIP SCT (DISPOSABLE) ×1 IMPLANT
SET PAD KNEE POSITIONER (MISCELLANEOUS) ×3 IMPLANT
SUCTION FRAZIER 12FR DISP (SUCTIONS) ×3 IMPLANT
SUT MNCRL AB 4-0 PS2 18 (SUTURE) ×3 IMPLANT
SUT VIC AB 1 CT1 36 (SUTURE) ×3 IMPLANT
SUT VIC AB 2-0 CT1 27 (SUTURE) ×6
SUT VIC AB 2-0 CT1 TAPERPNT 27 (SUTURE) ×3 IMPLANT
SUT VLOC 180 0 24IN GS25 (SUTURE) ×3 IMPLANT
SYR 50ML LL SCALE MARK (SYRINGE) ×3 IMPLANT
TOWEL OR 17X26 10 PK STRL BLUE (TOWEL DISPOSABLE) ×3 IMPLANT
TOWEL OR NON WOVEN STRL DISP B (DISPOSABLE) ×3 IMPLANT
TRAY FOLEY W/METER SILVER 14FR (SET/KITS/TRAYS/PACK) ×3 IMPLANT
TRAY FOLEY W/METER SILVER 16FR (SET/KITS/TRAYS/PACK) IMPLANT
WATER STERILE IRR 1500ML POUR (IV SOLUTION) ×3 IMPLANT
WRAP KNEE MAXI GEL POST OP (GAUZE/BANDAGES/DRESSINGS) ×3 IMPLANT
YANKAUER SUCT BULB TIP 10FT TU (MISCELLANEOUS) ×3 IMPLANT

## 2014-11-13 NOTE — Interval H&P Note (Signed)
History and Physical Interval Note:  11/13/2014 10:32 AM  [name] [name]  has presented today for surgery, with the diagnosis of LEFT KNEE OA  The various methods of treatment have been discussed with the patient and family. After consideration of risks, benefits and other options for treatment, the patient has consented to  Procedure(s): TOTAL KNEE ARTHROPLASTY (Left) as a surgical intervention .  The patient's history has been reviewed, patient examined, no change in status, stable for surgery.  I have reviewed the patient's chart and labs.  Questions were answered to the patient's satisfaction.     Mauri Pole

## 2014-11-13 NOTE — Anesthesia Postprocedure Evaluation (Signed)
  Anesthesia Post-op Note  Patient: [name] [name]  Procedure(s) Performed: Procedure(s) (LRB): TOTAL KNEE ARTHROPLASTY (Left)  Patient Location: PACU  Anesthesia Type: General  Level of Consciousness: awake and alert   Airway and Oxygen Therapy: Patient Spontanous Breathing  Post-op Pain: mild  Post-op Assessment: Post-op Vital signs reviewed, Patient's Cardiovascular Status Stable, Respiratory Function Stable, Patent Airway and No signs of Nausea or vomiting  Last Vitals:  Filed Vitals:   11/13/14 0829  BP: 153/69  Pulse: 76  Temp: 36.4 C  Resp: 18    Post-op Vital Signs: stable   Complications: No apparent anesthesia complications

## 2014-11-13 NOTE — Transfer of Care (Signed)
Immediate Anesthesia Transfer of Care Note  Patient: [name] [name]  Procedure(s) Performed: Procedure(s): TOTAL KNEE ARTHROPLASTY (Left)  Patient Location: PACU  Anesthesia Type:General and Spinal  Level of Consciousness:  sedated, patient cooperative and responds to stimulation  Airway & Oxygen Therapy:Patient Spontanous Breathing and Patient connected to face mask oxgen  Post-op Assessment:  Report given to PACU RN and Post -op Vital signs reviewed and stable  Post vital signs:  Reviewed and stable  Last Vitals:  Filed Vitals:   11/13/14 0829  BP: 153/69  Pulse: 76  Temp: 36.4 C  Resp: 18    Complications: No apparent anesthesia complications

## 2014-11-13 NOTE — Anesthesia Preprocedure Evaluation (Addendum)
Anesthesia Evaluation  Patient identified by MRN, date of birth, ID band Patient awake    Reviewed: Allergy & Precautions, H&P , NPO status , Patient's Chart, lab work & pertinent test results  History of Anesthesia Complications (+) history of anesthetic complications (delayed emergence 30 years ago)  Airway Mallampati: I  TM Distance: >3 FB Neck ROM: Full    Dental no notable dental hx.    Pulmonary neg pulmonary ROS,    Pulmonary exam normal breath sounds clear to auscultation       Cardiovascular hypertension, Pt. on medications Normal cardiovascular exam+ dysrhythmias (PVCs) + Valvular Problems/Murmurs MVP  Rhythm:Regular Rate:Normal  2012 stress test normal  2015 carotid dopplers with <40% stenosis on right, widely patent left ICA   Neuro/Psych Anxiety  Neuromuscular disease    GI/Hepatic Neg liver ROS, hiatal hernia, GERD  ,Atrophic gastritis   Endo/Other  negative endocrine ROS  Renal/GU negative Renal ROS  negative genitourinary   Musculoskeletal  (+) Arthritis ,   Abdominal   Peds negative pediatric ROS (+)  Hematology negative hematology ROS (+)   Anesthesia Other Findings   Reproductive/Obstetrics negative OB ROS                           Anesthesia Physical  Anesthesia Plan  ASA: II  Anesthesia Plan: Spinal   Post-op Pain Management:    Induction: Intravenous  Airway Management Planned: Simple Face Mask  Additional Equipment:   Intra-op Plan:   Post-operative Plan:   Informed Consent: I have reviewed the patients History and Physical, chart, labs and discussed the procedure including the risks, benefits and alternatives for the proposed anesthesia with the patient or authorized representative who has indicated his/her understanding and acceptance.   Dental advisory given  Plan Discussed with: CRNA and Anesthesiologist  Anesthesia Plan Comments:          Anesthesia Quick Evaluation

## 2014-11-13 NOTE — Op Note (Signed)
NAME:  [name] [name]                      MEDICAL RECORD NO.:  [id number]                             FACILITY:  [facility]      PHYSICIAN:  Pietro Cassis. Alvan Dame, M.D.  DATE OF BIRTH:  [birth year]      DATE OF PROCEDURE:  11/13/2014                                     OPERATIVE REPORT         PREOPERATIVE DIAGNOSIS:  Left knee osteoarthritis.      POSTOPERATIVE DIAGNOSIS:  Left knee osteoarthritis.      FINDINGS:  The patient was noted to have complete loss of cartilage and   bone-on-bone arthritis with associated osteophytes in the patellofemoral and medial compartments of   the knee with a significant synovitis and associated effusion.      PROCEDURE:  Left total knee replacement.      COMPONENTS USED:  [employer] rotating platform posterior stabilized knee   system, a size 4N femur, 3 tibia, 10 mm PS insert, and 35 patellar   button.      SURGEON:  Pietro Cassis. Alvan Dame, M.D.      ASSISTANT:  Nehemiah Massed, PA-C.      ANESTHESIA:  General and Spinal.      SPECIMENS:  None.      COMPLICATION:  None.      DRAINS:  None.  EBL: <50cc      TOURNIQUET TIME:   Total Tourniquet Time Documented: Thigh (Left) - 33 minutes Total: Thigh (Left) - 33 minutes  .      The patient was stable to the recovery room.      INDICATION FOR PROCEDURE:  [name] [name] is a 81 y.o. female patient of   mine.  The patient had been seen, evaluated, and treated conservatively in the   office with medication, activity modification, and injections.  The patient had   radiographic changes of bone-on-bone arthritis with endplate sclerosis and osteophytes noted.      The patient failed conservative measures including medication, injections, and activity modification, and at this point was ready for more definitive measures.   Based on the radiographic changes and failed conservative measures, the patient   decided to proceed with total knee replacement.  Risks of infection,   DVT, component failure,  need for revision surgery, postop course, and   expectations were all   discussed and reviewed.  Consent was obtained for benefit of pain   relief.      PROCEDURE IN DETAIL:  The patient was brought to the operative theater.   Once adequate anesthesia, preoperative antibiotics, 2 gm of Ancef, 1 gm of Tranexamic Acid, and 10 mg of Decadron administered, the patient was positioned supine with the left thigh tourniquet placed.  The  left lower extremity was prepped and draped in sterile fashion.  A time-   out was performed identifying the patient, planned procedure, and   extremity.      The left lower extremity was placed in the [facility] leg holder.  The leg was   exsanguinated, tourniquet elevated to 250 mmHg.  A midline incision was   made followed  by median parapatellar arthrotomy.  Following initial   exposure, attention was first directed to the patella.  Precut   measurement was noted to be 22 mm.  I resected down to 14 mm and used a   35 patellar button to restore patellar height as well as cover the cut   surface.      The lug holes were drilled and a metal shim was placed to protect the   patella from retractors and saw blades.      At this point, attention was now directed to the femur.  The femoral   canal was opened with a drill, irrigated to try to prevent fat emboli.  An   intramedullary rod was passed at 3 degrees valgus, 10 mm of bone was   resected off the distal femur.  Following this resection, the tibia was   subluxated anteriorly.  Using the extramedullary guide, 6 mm of bone was resected off   the proximal lateral tibia.  We confirmed the gap would be   stable medially and laterally with a 10 mm insert as well as confirmed   the cut was perpendicular in the coronal plane, checking with an alignment rod.      Once this was done, I sized the femur to be a size 4 in the anterior-   posterior dimension, chose a narrow component based on medial and   lateral dimension.   The size 4 rotation block was then pinned in   position anterior referenced using the C-clamp to set rotation.  The   anterior, posterior, and  chamfer cuts were made without difficulty nor   notching making certain that I was along the anterior cortex to help   with flexion gap stability.      The final box cut was made off the lateral aspect of distal femur.      At this point, the tibia was sized to be a size 3, the size 3 tray was   then pinned in position through the medial third of the tubercle,   drilled, and keel punched.  Trial reduction was now carried with a 4N femur,  3 tibia, a size 10 mm PS insert, and the 35 patella botton.  The knee was brought to   extension, full extension with good flexion stability with the patella   tracking through the trochlea without application of pressure.  Given   all these findings, the trial components removed.  Final components were   opened and cement was mixed.  The knee was irrigated with normal saline   solution and pulse lavage.  The synovial lining was   then injected with 30cc of 0.25% Marcaine with epinephrine and 1 cc of Toradol plus 30 cc of NS for a  total of 61 cc.      The knee was irrigated.  Final implants were then cemented onto clean and   dried cut surfaces of bone with the knee brought to extension with a size 10 mm trial insert.      Once the cement had fully cured, the excess cement was removed   throughout the knee.  I confirmed I was satisfied with the range of   motion and stability, and the final 10 mm PS insert was chosen.  It was   placed into the knee.      The tourniquet had been let down at 33 minutes.  No significant   hemostasis required.  The   extensor mechanism was  then reapproximated using #1 Vicryl and #0 V-lock sutures with the knee   in flexion.  The   remaining wound was closed with 2-0 Vicryl and running 4-0 Monocryl.   The knee was cleaned, dried, dressed sterilely using Dermabond and   Aquacel  dressing.  The patient was then   brought to recovery room in stable condition, tolerating the procedure   well.   Please note that Physician Assistant, Nehemiah Massed, PA-C, was present for the entirety of the case, and was utilized for pre-operative positioning, peri-operative retractor management, general facilitation of the procedure.  He was also utilized for primary wound closure at the end of the case.              Pietro Cassis Alvan Dame, M.D.    11/13/2014 1:07 PM

## 2014-11-13 NOTE — Anesthesia Procedure Notes (Addendum)
Spinal Patient location during procedure: OR Staffing Anesthesiologist: JUDD, BENJAMIN Resident/CRNA: EARGLE, BETH E Performed by: resident/CRNA and anesthesiologist  Preanesthetic Checklist Completed: patient identified, site marked, surgical consent, pre-op evaluation, timeout performed, IV checked, risks and benefits discussed and monitors and equipment checked Spinal Block Patient position: sitting Prep: Betadine Patient monitoring: heart rate, continuous pulse ox and blood pressure Location: L3-4 Injection technique: single-shot Needle Needle type: Quincke  Needle gauge: 22 G Needle length: 9 cm Additional Notes Expiration date of kit checked and confirmed. Patient tolerated procedure well, without complications.  Difficult spinal given posterior obesity and osteophytic burden. Ultimately successful midline with Quinke 22Gauge. Took 4 attempts between CRNA and myself   Procedure Name: LMA Insertion Date/Time: 11/13/2014 12:20 PM Performed by: EARGLE, BETH E Pre-anesthesia Checklist: Patient identified, Emergency Drugs available, Suction available and Patient being monitored Patient Re-evaluated:Patient Re-evaluated prior to inductionOxygen Delivery Method: Circle system utilized Preoxygenation: Pre-oxygenation with 100% oxygen Intubation Type: IV induction Ventilation: Mask ventilation with difficulty LMA: LMA inserted LMA Size: 4.0 Number of attempts: 1 Placement Confirmation: CO2 detector and breath sounds checked- equal and bilateral Dental Injury: Teeth and Oropharynx as per pre-operative assessment      

## 2014-11-13 NOTE — Discharge Instructions (Signed)

## 2014-11-13 NOTE — Progress Notes (Signed)
Utilization review completed.  

## 2014-11-14 LAB — CBC
HCT: 26.7 % — ABNORMAL LOW (ref 36.0–46.0)
HEMOGLOBIN: 8.4 g/dL — AB (ref 12.0–15.0)
MCH: 27.1 pg (ref 26.0–34.0)
MCHC: 31.5 g/dL (ref 30.0–36.0)
MCV: 86.1 fL (ref 78.0–100.0)
Platelets: 191 10*3/uL (ref 150–400)
RBC: 3.1 MIL/uL — ABNORMAL LOW (ref 3.87–5.11)
RDW: 14.8 % (ref 11.5–15.5)
WBC: 10 10*3/uL (ref 4.0–10.5)

## 2014-11-14 LAB — BASIC METABOLIC PANEL
Anion gap: 6 (ref 5–15)
BUN: 18 mg/dL (ref 6–20)
CALCIUM: 8.2 mg/dL — AB (ref 8.9–10.3)
CHLORIDE: 105 mmol/L (ref 101–111)
CO2: 24 mmol/L (ref 22–32)
CREATININE: 1.07 mg/dL — AB (ref 0.44–1.00)
GFR calc non Af Amer: 46 mL/min — ABNORMAL LOW (ref >60)
GFR, EST AFRICAN AMERICAN: 54 mL/min — AB (ref >60)
Glucose, Bld: 110 mg/dL — ABNORMAL HIGH (ref 65–99)
Potassium: 5 mmol/L (ref 3.5–5.1)
SODIUM: 135 mmol/L (ref 135–145)

## 2014-11-14 NOTE — Care Management Note (Signed)
Case Management Note  Patient Details  Name: TILLA WILBORN MRN: [id number] Date of Birth: 1928-04-21  Subjective/Objective:                   TOTAL KNEE ARTHROPLASTY (Left) Action/Plan:  Discharge planning Expected Discharge Date:                  Expected Discharge Plan:  [place]  In-House Referral:     Discharge planning Services  CM Consult  Post Acute Care Choice:    Choice offered to:     DME Arranged:    DME Agency:     HH Arranged:    [place] Agency:     Status of Service:  Completed, signed off  Medicare Important Message Given:    Date Medicare IM Given:    Medicare IM give by:    Date Additional Medicare IM Given:    Additional Medicare Important Message give by:     If discussed at [place] of Stay Meetings, dates discussed:    Additional Comments: CM notes plan is for pt to go to SNF; CSW aware and arranging.  No other CM needs were communicated. Dellie Catholic, RN 11/14/2014, 2:06 PM

## 2014-11-14 NOTE — Evaluation (Addendum)
Physical Therapy Treatment Patient Details Name: [name] [name] MRN: [id number] DOB: 1928/07/21 Today's Date: 11/14/2014    History of Present Illness s/p L TKA, hx of R TKA and revision in 2014 (R knee stiffness and swelling limits AROM)    PT Comments    81 yo pt s/p L TKA with deficits listed below (see PT Problem List) would benefit from PT services due to deficits listed below, recommend SNF, pt lives alone and will benefit from short term rehab to maximize independence and decrease risk of falls.    Follow Up Recommendations  SNF     Equipment Recommendations  None recommended by PT    Recommendations for Other Services       Precautions / Restrictions Precautions Precautions: Knee;Fall Restrictions Weight Bearing Restrictions: No LLE Weight Bearing: Weight bearing as tolerated    Mobility  Bed Mobility Overal bed mobility: Needs Assistance Bed Mobility: Supine to Sit     Supine to sit: Min assist;HOB elevated     General bed mobility comments: Pt was in a recliner upon arrival   Transfers Overall transfer level: Needs assistance Equipment used: Rolling walker (2 wheeled) Transfers: Sit to/from Stand Sit to Stand: Min assist         General transfer comment: steadying assist to rise, VC's ~50% for proper UE positioning and LLE extension  Ambulation/Gait Ambulation/Gait assistance: Min assist Ambulation Distance (Feet): 100 Feet Assistive device: Rolling walker (2 wheeled) Gait Pattern/deviations: Step-through pattern;Decreased stride length;Antalgic;Trunk flexed (multiple cues to extend trunk ) Gait velocity: decreased   General Gait Details: multimodal cues for posture correction, RW progression sequence, and UE postionong on the walker during gait    Stairs            Wheelchair Mobility    Modified Rankin (Stroke Patients Only)       Balance                                    Cognition Arousal/Alertness:  Awake/alert Behavior During Therapy: WFL for tasks assessed/performed Overall Cognitive Status: Within Functional Limits for tasks assessed                      Exercises Total Joint Exercises Ankle Circles/Pumps:  (Educated pt on performance of ankle pumps and knee presses )    General Comments        Pertinent Vitals/Pain Pain Assessment: 0-10 Pain Score: 5  (Increased to 10 with first few steps of ambulation) Pain Location: L knee Pain Descriptors / Indicators: Sore Pain Intervention(s): Limited activity within patient's tolerance;Monitored during session;Premedicated before session;Repositioned;Ice applied    Home Living Family/patient expects to be discharged to:: Skilled nursing facility Living Arrangements: Alone                  Prior Function Level of Independence: Independent          PT Goals (current goals can now be found in the care plan section) Acute Rehab PT Goals Patient Stated Goal: go to rehab, work hard, and get back home, being able to go back to aquatic exercises 2 x week PT Goal Formulation: With patient Time For Goal Achievement: 11/21/14 Potential to Achieve Goals: Good    Frequency  7X/week    PT Plan      Co-evaluation             End of Session Equipment  Utilized During Treatment: Gait belt Activity Tolerance: Patient tolerated treatment well Patient left: in chair;with call bell/phone within reach;with chair alarm set     Time: 0950-1006 PT Time Calculation (min) (ACUTE ONLY): 16 min  Charges:                       G Codes:      Dequarius Jeffries, SPT 2014-11-27, 11:20 AM

## 2014-11-14 NOTE — Progress Notes (Signed)
Physical Therapy Treatment Patient Details Name: [name] [name] MRN: [id number] DOB: 04-09-1928 Today's Date: 11/14/2014    History of Present Illness s/p L TKA, hx of R TKA and revision in 2014 (R knee stiffness and swelling limits AROM)    PT Comments    Pt is progressing well toward her goals and is eager to to ambulate and perform her exercises. Her pain increases during ambulation, but she is able to work through pain as we modified activity intensity.    Follow Up Recommendations  SNF     Equipment Recommendations  None recommended by PT    Recommendations for Other Services       Precautions / Restrictions Precautions Precautions: Knee;Fall Restrictions Weight Bearing Restrictions: No LLE Weight Bearing: Weight bearing as tolerated    Mobility  Bed Mobility Overal bed mobility: Needs Assistance Bed Mobility: Sit to Supine       Sit to supine: Min assist   General bed mobility comments: Pt was in a recliner upon arrival. Pt required ~20% assist with her LLE to lay supine.   Transfers Overall transfer level: Needs assistance Equipment used: Rolling walker (2 wheeled) Transfers: Sit to/from Stand Sit to Stand: Min assist         General transfer comment: assist with LE extension, VC's for weight shift  Ambulation/Gait Ambulation/Gait assistance: Min assist Ambulation Distance (Feet): 75 Feet Assistive device: Rolling walker (2 wheeled) Gait Pattern/deviations: Step-through pattern;Decreased stride length;Antalgic;Decreased weight shift to left Gait velocity: decreased   General Gait Details: ~25% verbal cues for weight shifting and gait pattern     Stairs            Wheelchair Mobility    Modified Rankin (Stroke Patients Only)       Balance                                    Cognition Arousal/Alertness: Awake/alert Behavior During Therapy: WFL for tasks assessed/performed Overall Cognitive Status: Within Functional  Limits for tasks assessed                      Exercises Total Joint Exercises Ankle Circles/Pumps: AROM;Strengthening;20 reps;Both Quad Sets: Strengthening;Left;15 reps Towel Squeeze: Strengthening;Both;15 reps Short Arc Quad: AROM;Strengthening;Left;10 reps Heel Slides: AAROM;Strengthening;Left;10 reps Hip ABduction/ADduction: AROM;Strengthening;Left;15 reps Straight Leg Raises: AROM;Strengthening;Left;10 reps Goniometric ROM: ~ 60 degrees L knee flexion    General Comments        Pertinent Vitals/Pain Pain Score: 3  (Increased to 9 after exercises and amb) Pain Location: L knee Pain Descriptors / Indicators: Sore Pain Intervention(s): Limited activity within patient's tolerance;Monitored during session;Repositioned;Ice applied    Home Living                      Prior Function            PT Goals (current goals can now be found in the care plan section) Acute Rehab PT Goals Patient Stated Goal: go to rehab, work hard, and get back home, being able to go back to aquatic exercises 2 x week PT Goal Formulation: With patient Time For Goal Achievement: 11/21/14 Potential to Achieve Goals: Good Progress towards PT goals: Progressing toward goals    Frequency  7X/week    PT Plan Current plan remains appropriate    Co-evaluation             End of  Session Equipment Utilized During Treatment: Gait belt Activity Tolerance: Patient tolerated treatment well Patient left: in bed;with call bell/phone within reach     Time: 6728-9791 PT Time Calculation (min) (ACUTE ONLY): 31 min  Charges:  $Gait Training: 8-22 mins $Therapeutic Exercise: 8-22 mins                    G Codes:      Keasha Malkiewicz December 11, 2014, 5:04 PM

## 2014-11-14 NOTE — NC FL2 (Signed)
[place] LEVEL OF CARE SCREENING TOOL     IDENTIFICATION  Patient Name: [name] [name] Birthdate: 1928/09/19 Sex: female Admission Date (Current Location): 11/13/2014  [facility] and [place] Number:     Facility and Address:  [facility],  [place] [address] [place]      Provider Number: [id number]  Attending Physician Name and Address:  Paralee Cancel, MD  Relative Name and Phone Number:       Current Level of Care: Hospital Recommended Level of Care: [place] Prior Approval Number:    Date Approved/Denied:   PASRR Number:    Discharge Plan: SNF    Current Diagnoses: Patient Active Problem List   Diagnosis Date Noted  . S/P left TKA 11/13/2014  . S/P knee replacement 11/13/2014  . Overweight (BMI 25.0-29.9) 07/15/2012  . Expected blood loss anemia 07/15/2012  . S/P right TK revision 07/14/2012  . Ankylosis of knee joint 04/03/2011  . Fibrosis of knee joint 01/22/2011  . Chest pain 10/20/2010  . Bacterial overgrowth syndrome 10/09/2010  . Status post right hemicolectomy 10/09/2010  . Esophageal dysphagia 10/09/2010  . B12 deficiency 10/09/2010  . DYSPHAGIA UNSPECIFIED 01/23/2010  . BLIND LOOP SYNDROME 08/26/2009  . GASTRITIS 12/26/2008  . WEIGHT LOSS-ABNORMAL 12/25/2008  . DYSPHAGIA 12/25/2008  . DIARRHEA 07/17/2008  . HOARSENESS 07/26/2007  . B12 DEFICIENCY 07/25/2007  . HYPERTENSION 07/25/2007  . MITRAL VALVE PROLAPSE 07/25/2007  . PREMATURE VENTRICULAR CONTRACTIONS 07/25/2007  . ESOPHAGITIS, REFLUX 07/25/2007  . GERD 07/25/2007  . GASTRITIS, CHRONIC 07/25/2007  . HIATAL HERNIA 07/25/2007  . ABSCESS OF INTESTINE 07/25/2007    Orientation ACTIVITIES/SOCIAL BLADDER RESPIRATION  Self (oriented x 4) Active Continent Normal  BEHAVIORAL SYMPTOMS/MOOD NEUROLOGICAL BOWEL NUTRITION STATUS   (no behaviors)   Continent Diet  PHYSICIAN VISITS COMMUNICATION OF NEEDS Height & Weight Skin    Verbally 5\' 4"  (162.6  cm) 162 lbs. Surgical wounds          AMBULATORY STATUS RESPIRATION    Assist extensive Normal      Personal Care Assistance Level of Assistance               Functional Limitations Info                SPECIAL CARE FACTORS FREQUENCY  PT (By licensed PT) (OT)     PT Frequency:  (6-7 x weekly)             Additional Factors Info  Code Status, Psychotropic (Full Code)               Current Medications (11/14/2014): Current Facility-Administered Medications  Medication Dose Route Frequency Provider Last Rate Last Dose  . ALPRAZolam Duanne Moron) tablet 0.25 mg  0.25 mg Oral QID PRN Danae Orleans, PA-C      . alum & mag hydroxide-simeth (MAALOX/MYLANTA) 200-200-20 MG/5ML suspension 30 mL  30 mL Oral Q4H PRN Danae Orleans, PA-C      . amLODipine (NORVASC) tablet 2.5 mg  2.5 mg Oral q morning - 10a Danae Orleans, PA-C   2.5 mg at 11/14/14 3532  . aspirin EC tablet 325 mg  325 mg Oral BID Danae Orleans, PA-C   325 mg at 11/14/14 0740  . bisacodyl (DULCOLAX) suppository 10 mg  10 mg Rectal Daily PRN Danae Orleans, PA-C      . celecoxib (CELEBREX) capsule 200 mg  200 mg Oral Q12H Danae Orleans, PA-C   200 mg at 11/14/14 9924  . diphenhydrAMINE (  BENADRYL) capsule 25 mg  25 mg Oral Q6H PRN Danae Orleans, PA-C      . docusate sodium (COLACE) capsule 100 mg  100 mg Oral BID Danae Orleans, PA-C   100 mg at 11/14/14 2633  . ferrous sulfate tablet 325 mg  325 mg Oral TID PC Danae Orleans, PA-C   325 mg at 11/14/14 3545  . HYDROcodone-acetaminophen (NORCO) 7.5-325 MG per tablet 1-2 tablet  1-2 tablet Oral Q4H Danae Orleans, PA-C   2 tablet at 11/14/14 0740  . HYDROmorphone (DILAUDID) injection 0.5-2 mg  0.5-2 mg Intravenous Q2H PRN Danae Orleans, PA-C   1 mg at 11/13/14 1755  . lactated ringers infusion   Intravenous Continuous Jillyn Hidden, MD 125 mL/hr at 11/13/14 1054    . loratadine (CLARITIN) tablet 10 mg  10 mg Oral Daily PRN Danae Orleans, PA-C      . magnesium  citrate solution 1 Bottle  1 Bottle Oral Once PRN Danae Orleans, PA-C      . menthol-cetylpyridinium (CEPACOL) lozenge 3 mg  1 lozenge Oral PRN Danae Orleans, PA-C       Or  . phenol (CHLORASEPTIC) mouth spray 1 spray  1 spray Mouth/Throat PRN Danae Orleans, PA-C      . methocarbamol (ROBAXIN) tablet 500 mg  500 mg Oral Q6H PRN Danae Orleans, PA-C   500 mg at 11/13/14 2122   Or  . methocarbamol (ROBAXIN) 500 mg in dextrose 5 % 50 mL IVPB  500 mg Intravenous Q6H PRN Danae Orleans, PA-C   500 mg at 11/13/14 1437  . metoCLOPramide (REGLAN) tablet 5-10 mg  5-10 mg Oral Q8H PRN Danae Orleans, PA-C       Or  . metoCLOPramide (REGLAN) injection 5-10 mg  5-10 mg Intravenous Q8H PRN Danae Orleans, PA-C      . ondansetron [facility]) tablet 4 mg  4 mg Oral Q6H PRN Danae Orleans, PA-C       Or  . ondansetron [facility]) injection 4 mg  4 mg Intravenous Q6H PRN Danae Orleans, PA-C      . polyethylene glycol (MIRALAX / GLYCOLAX) packet 17 g  17 g Oral BID Danae Orleans, PA-C   17 g at 11/14/14 6256  . sodium chloride 0.9 % 1,000 mL with potassium chloride 10 mEq infusion   Intravenous Continuous Danae Orleans, PA-C 100 mL/hr at 11/14/14 0740     Do not use this list as official medication orders. Please verify with discharge summary.  Discharge Medications:   Medication List    ASK your doctor about these medications        acetaminophen 500 MG tablet  Commonly known as:  TYLENOL  Take 1,000 mg by mouth every 6 (six) hours as needed for moderate pain.     ALPRAZolam 0.25 MG tablet  Commonly known as:  XANAX  Take 1 tablet (0.25 mg total) by mouth 4 (four) times daily as needed. ANXIETY/ SPASM     amLODipine 2.5 MG tablet  Commonly known as:  NORVASC  Take 2.5 mg by mouth every morning.     DEXILANT 60 MG capsule  Generic drug:  dexlansoprazole  TAKE 1 CAPSULE DAILY 30 MINUTES PRIOR TO BREAKFAST     ferrous sulfate 325 (65 FE) MG tablet  Take 1 tablet (325 mg total) by mouth 3 (three)  times daily after meals.     fish oil-omega-3 fatty acids 1000 MG capsule  Take 3 g by mouth daily.     HYDROcodone-acetaminophen 7.5-325 MG tablet  Commonly known as:  NORCO  Take 1-2 tablets by mouth every 4 (four) hours as needed for pain.     loratadine 10 MG tablet  Commonly known as:  CLARITIN  Take 10 mg by mouth daily as needed for allergies.     OVER THE COUNTER MEDICATION  Take 1 tablet by mouth 3 (three) times daily as needed (pain). Curamin     polyethylene glycol packet  Commonly known as:  MIRALAX / GLYCOLAX  Take 17 g by mouth 2 (two) times daily.     ramipril 10 MG tablet  Commonly known as:  ALTACE  Take 10 mg by mouth 2 (two) times daily.     traMADol 50 MG tablet  Commonly known as:  ULTRAM  Take 1 tablet (50 mg total) by mouth every 6 (six) hours as needed.     Vitamin D (Ergocalciferol) 50000 UNITS Caps capsule  Commonly known as:  DRISDOL  Take 50,000 Units by mouth every 7 (seven) days. Friday     VSL#3 Caps  Take 3 capsules by mouth daily.        Relevant Imaging Results:  Relevant Lab Results:  Recent Labs    Additional Information    Westlynn Fifer, Randall An, LCSW

## 2014-11-14 NOTE — Progress Notes (Signed)
     Subjective: 1 Day Post-Op Procedure(s) (LRB): TOTAL KNEE ARTHROPLASTY (Left)   Patient reports pain as mild, pain controlled. No events throughout the night.  Objective:   VITALS:   Filed Vitals:   11/14/14 0426  BP: 114/54  Pulse: 68  Temp: 98.5 F (36.9 C)  Resp: 18    Dorsiflexion/Plantar flexion intact Incision: dressing C/D/I No cellulitis present Compartment soft  LABS  Recent Labs  11/14/14 0555  HGB 8.4*  HCT 26.7*  WBC 10.0  PLT 191     Recent Labs  11/14/14 0555  NA 135  K 5.0  BUN 18  CREATININE 1.07*  GLUCOSE 110*     Assessment/Plan: 1 Day Post-Op Procedure(s) (LRB): TOTAL KNEE ARTHROPLASTY (Left) Foley cath d/c'ed Advance diet Up with therapy D/C IV fluids Discharge to SNF eventually, when ready  Overweight (BMI 25-29.9) Estimated body mass index is 27.83 kg/(m^2) as calculated from the following:   Height as of this encounter: 5\' 4"  (1.626 m).   Weight as of this encounter: 73.568 kg (162 lb 3 oz). Patient also counseled that weight may inhibit the healing process Patient counseled that losing weight will help with future health issues      [name] [name]   PAC  11/14/2014, 9:25 AM

## 2014-11-14 NOTE — Clinical Social Work Note (Signed)
Clinical Social Work Assessment  Patient Details  Name: [name] [name] MRN: [id number] Date of Birth: 12-Nov-1928  Date of referral:  11/14/14               Reason for consult:  Facility Placement, Discharge Planning                Permission sought to share information with:  Chartered certified accountant granted to share information::  Yes, Verbal Permission Granted  Name::        Agency::     Relationship::     Contact Information:     Housing/Transportation Living arrangements for the past 2 months:  Single Family Home Source of Information:  Patient Patient Interpreter Needed:  None Criminal Activity/Legal Involvement Pertinent to Current Situation/Hospitalization:  No - Comment as needed Significant Relationships:  Adult Children Lives with:  Spouse Do you feel safe going back to the place where you live?   (ST Rehab recommended.) Need for family participation in patient care:  Yes (Comment)  Care giving concerns:  Pt's care cannot be managed at home following hospital d/c.   Social Worker assessment / plan:  Pt hospitalized on 11/13/14 for pre planned left total knee arthroplasty. PT has recommended ST Rehab following hospital d/c. CSW has met with pt to assist with d/c planning. Pt has requested [facility] for her rehab. SNF has been contacted and bed offer provided to pt. CSW will continue to follow to assist with d/c planning to SNF.  Employment status:  Retired Nurse, adult PT Recommendations:  [place] / Referral to community resources:  [place]  Patient/Family's Response to care:  Pt feels ST Rehab is needed.  Patient/Family's Understanding of and Emotional Response to Diagnosis, Current Treatment, and Prognosis:  Pt is aware of her medical status. She is motivated to work with therapy. Pt is pleased [facility] is able to accept her for rehab.  Emotional Assessment Appearance:   Appears stated age Attitude/Demeanor/Rapport:  Other (cooperative) Affect (typically observed):  Calm, Pleasant Orientation:  Oriented to Self, Oriented to Place, Oriented to  Time, Oriented to Situation Alcohol / Substance use:  Not Applicable Psych involvement (Current and /or in the community):  No (Comment)  Discharge Needs  Concerns to be addressed:  Discharge Planning Concerns Readmission within the last 30 days:  No Current discharge risk:  None Barriers to Discharge:  No Barriers Identified   Luretha Rued, [place]  11/14/2014, 2:40 PM

## 2014-11-14 NOTE — Evaluation (Signed)
Occupational Therapy Evaluation Patient Details Name: [name] [name] MRN: [id number] DOB: 1928/12/02 Today's Date: 11/14/2014    History of Present Illness s/p L TKA   Clinical Impression   This 81 year old female was admitted for the above surgery. She will benefit from skilled OT to increase safety and independence with adls.  Pt currently needs mostly min A for ADLs; goals are overall supervision to min guard.    Follow Up Recommendations  SNF    Equipment Recommendations  3 in 1 bedside comode (likely)    Recommendations for Other Services       Precautions / Restrictions Precautions Precautions: Knee Restrictions Weight Bearing Restrictions: No      Mobility Bed Mobility Overal bed mobility: Needs Assistance Bed Mobility: Supine to Sit     Supine to sit: Min assist;HOB elevated     General bed mobility comments: assist for LLE--pt assisting with moving it  Transfers Overall transfer level: Needs assistance Equipment used: Rolling walker (2 wheeled) Transfers: Sit to/from Stand Sit to Stand: Min assist         General transfer comment: assist to rise and stabilize; cues for UE/LE placement    Balance                                            ADL Overall ADL's : Needs assistance/impaired     Grooming: Wash/dry hands;Wash/dry face;Set up;Sitting   Upper Body Bathing: Set up;Sitting   Lower Body Bathing: Minimal assistance;Sit to/from stand   Upper Body Dressing : Set up;Sitting   Lower Body Dressing: Moderate assistance;Sit to/from stand   Toilet Transfer: Minimal assistance;Stand-pivot (to recliner)   Toileting- Clothing Manipulation and Hygiene: Minimal assistance;Sit to/from stand         General ADL Comments: performed ADL from EOB and transferred to recliner.       Vision     Perception     Praxis      Pertinent Vitals/Pain Pain Assessment: 0-10 Pain Score: 6  Pain Location: L knrr Pain Descriptors  / Indicators: Aching Pain Intervention(s): Limited activity within patient's tolerance;Monitored during session;Premedicated before session;Repositioned;Ice applied     Hand Dominance     Extremity/Trunk Assessment Upper Extremity Assessment Upper Extremity Assessment: Overall WFL for tasks assessed           Communication Communication Communication: No difficulties   Cognition Arousal/Alertness: Awake/alert Behavior During Therapy: WFL for tasks assessed/performed Overall Cognitive Status: Within Functional Limits for tasks assessed                     General Comments       Exercises       Shoulder Instructions      Home Living Family/patient expects to be discharged to:: Skilled nursing facility Living Arrangements: Alone                                      Prior Functioning/Environment Level of Independence: Independent             OT Diagnosis: Generalized weakness;Acute pain   OT Problem List: Decreased strength;Decreased activity tolerance;Decreased knowledge of use of DME or AE;Pain   OT Treatment/Interventions: Self-care/ADL training;DME and/or AE instruction;Patient/family education    OT Goals(Current goals can be found in the care  plan section) Acute Rehab OT Goals Patient Stated Goal: go to rehab, work hard, and get back home OT Goal Formulation: With patient Time For Goal Achievement: 11/21/14 Potential to Achieve Goals: Good ADL Goals Pt Will Perform Grooming: with supervision;standing Pt Will Perform Lower Body Bathing: with supervision;sit to/from stand;with adaptive equipment Pt Will Perform Lower Body Dressing: with adaptive equipment;sit to/from stand;with min assist Pt Will Transfer to Toilet: ambulating;bedside commode;with min guard assist Pt Will Perform Toileting - Clothing Manipulation and hygiene: with min guard assist;sit to/from stand  OT Frequency: Min 2X/week   Barriers to D/C:             Co-evaluation              End of Session    Activity Tolerance: Patient tolerated treatment well Patient left: in chair;with call bell/phone within reach   Time: 0828-0849 OT Time Calculation (min): 21 min Charges:  OT General Charges $OT Visit: 1 Procedure OT Evaluation $Initial OT Evaluation Tier I: 1 Procedure G-Codes:    Marleny Faller 12/03/2014, 9:06 AM  Lesle Chris, OTR/L [phone] 2014/12/03

## 2014-11-14 NOTE — Clinical Social Work Placement (Signed)
   CLINICAL SOCIAL WORK PLACEMENT  NOTE  Date:  11/14/2014  Patient Details  Name: [name] [name] MRN: [id number] Date of Birth: February 02, 1928  Clinical Social Work is seeking post-discharge placement for this patient at the [place] level of care (*CSW will initial, date and re-position this form in  chart as items are completed):  Yes   Patient/family provided with [place] Work Department's list of facilities offering this level of care within the geographic area requested by the patient (or if unable, by the patient's family).  Yes   Patient/family informed of their freedom to choose among providers that offer the needed level of care, that participate in Medicare, Medicaid or managed care program needed by the patient, have an available bed and are willing to accept the patient.  Yes   Patient/family informed of [facility]'s ownership interest in [facility] and [facility], as well as of the fact that they are under no obligation to receive care at these facilities.  PASRR submitted to EDS on 11/14/14     PASRR number received on 11/14/14     Existing PASRR number confirmed on       FL2 transmitted to all facilities in geographic area requested by pt/family on 11/14/14     FL2 transmitted to all facilities within larger geographic area on       Patient informed that his/her managed care company has contracts with or will negotiate with certain facilities, including the following:        Yes   Patient/family informed of bed offers received.  Patient chooses bed at [facility]     Physician recommends and patient chooses bed at      Patient to be transferred to [facility] on  .  Patient to be transferred to facility by       Patient family notified on   of transfer.  Name of family member notified:        PHYSICIAN       Additional Comment:     _______________________________________________ Luretha Rued, [place] 11/14/2014, 2:46 PM

## 2014-11-15 LAB — BASIC METABOLIC PANEL
ANION GAP: 6 (ref 5–15)
BUN: 21 mg/dL — ABNORMAL HIGH (ref 6–20)
CALCIUM: 8.1 mg/dL — AB (ref 8.9–10.3)
CO2: 22 mmol/L (ref 22–32)
Chloride: 105 mmol/L (ref 101–111)
Creatinine, Ser: 1.14 mg/dL — ABNORMAL HIGH (ref 0.44–1.00)
GFR calc Af Amer: 50 mL/min — ABNORMAL LOW (ref >60)
GFR, EST NON AFRICAN AMERICAN: 43 mL/min — AB (ref >60)
Glucose, Bld: 99 mg/dL (ref 65–99)
Potassium: 4.3 mmol/L (ref 3.5–5.1)
Sodium: 133 mmol/L — ABNORMAL LOW (ref 135–145)

## 2014-11-15 LAB — CBC
HEMATOCRIT: 24 % — AB (ref 36.0–46.0)
Hemoglobin: 7.6 g/dL — ABNORMAL LOW (ref 12.0–15.0)
MCH: 26.9 pg (ref 26.0–34.0)
MCHC: 31.7 g/dL (ref 30.0–36.0)
MCV: 84.8 fL (ref 78.0–100.0)
PLATELETS: 163 10*3/uL (ref 150–400)
RBC: 2.83 MIL/uL — ABNORMAL LOW (ref 3.87–5.11)
RDW: 14.7 % (ref 11.5–15.5)
WBC: 8.6 10*3/uL (ref 4.0–10.5)

## 2014-11-15 MED ORDER — FERROUS SULFATE 325 (65 FE) MG PO TABS: 325.0000 mg | ORAL_TABLET | Freq: Three times a day (TID) | ORAL | Status: DC

## 2014-11-15 MED ORDER — ASPIRIN 325 MG PO TBEC: 325.0000 mg | DELAYED_RELEASE_TABLET | Freq: Two times a day (BID) | ORAL | Status: AC

## 2014-11-15 MED ORDER — POLYETHYLENE GLYCOL 3350 17 G PO PACK: 17.0000 g | PACK | Freq: Two times a day (BID) | ORAL | Status: DC

## 2014-11-15 MED ORDER — TIZANIDINE HCL 4 MG PO TABS: 4.0000 mg | ORAL_TABLET | Freq: Four times a day (QID) | ORAL | Status: DC | PRN

## 2014-11-15 MED ORDER — HYDROCODONE-ACETAMINOPHEN 7.5-325 MG PO TABS: 1.0000 | ORAL_TABLET | ORAL | Status: DC | PRN

## 2014-11-15 MED ORDER — DOCUSATE SODIUM 100 MG PO CAPS: 100.0000 mg | ORAL_CAPSULE | Freq: Two times a day (BID) | ORAL | Status: DC

## 2014-11-15 NOTE — Discharge Summary (Signed)
Physician Discharge Summary  Patient ID: DAN DISSINGER MRN: [id number] DOB/AGE: [id number] 79 y.o.  Admit date: 11/13/2014 Discharge date:  11/15/2014  Procedures:  Procedure(s) (LRB): TOTAL KNEE ARTHROPLASTY (Left)  Attending Physician:  Dr. Paralee Cancel   Admission Diagnoses:   Left knee primary OA / pain  Discharge Diagnoses:  Principal Problem:   S/P left TKA Active Problems:   Overweight (BMI 25.0-29.9)   S/P knee replacement  Past Medical History  Diagnosis Date  . Hypertension   . GERD (gastroesophageal reflux disease)   . MVP (mitral valve prolapse)   . PVC's (premature ventricular contractions)   . Hiatal hernia   . Anemia   . Fatty liver   . Blind loop syndrome   . Schatzki's ring   . Vitamin B12 deficiency   . 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 Allergy     SEASONAL  . Arthritis     KNEES/HANDS  . Anxiety   . Complication of anesthesia     pt can't remember exact complication, but she remembers about 30 years ago she may have had trouble waking up from anesthesia.    HPI:    [name] [name], 81 y.o. female, has a history of pain and functional disability in the left knee due to arthritis and has failed non-surgical conservative treatments for greater than 12 weeks to include NSAID's and/or analgesics, corticosteriod injections, viscosupplementation injections, use of assistive devices and activity modification. Onset of symptoms was gradual, starting 20+ years ago with gradually worsening course since that time. The patient noted no past surgery on the left knee(s). Patient currently rates pain in the left knee(s) at 10 out of 10 with activity. Patient has night pain, worsening of pain with activity and weight bearing, pain that interferes with activities of daily living, pain with passive range of motion, crepitus and joint swelling. Patient has evidence of periarticular osteophytes and joint space narrowing by imaging studies.  There is no active infection. Risks, benefits and expectations were discussed with the patient. Risks including but not limited to the risk of anesthesia, blood clots, nerve damage, blood vessel damage, failure of the prosthesis, infection and up to and including death. Patient understand the risks, benefits and expectations and wishes to proceed with surgery.   PCP: [name], MD   Discharged Condition: good  Hospital Course:  Patient underwent the above stated procedure on 11/13/2014. Patient tolerated the procedure well and brought to the recovery room in good condition and subsequently to the floor.  POD #1 BP: 114/54 ; Pulse: 68 ; Temp: 98.5 F (36.9 C) ; Resp: 18 Patient reports pain as mild, pain controlled. No events throughout the night. Dorsiflexion/plantar flexion intact, incision: dressing C/D/I, no cellulitis present and compartment soft.   LABS  Basename    HGB  8.4  HCT  26.7   POD #2  BP: 125/56 ; Pulse: 65 ; Temp: 97.6 F (36.4 C) ; Resp: 16 Patient reports pain as mild, pain controlled. No events throughout the night. Feels that she worked well with PT yesterday. Dorsiflexion/plantar flexion intact, incision: dressing C/D/I, no cellulitis present and compartment soft.   LABS  Basename    HGB  7.6  HCT  24.0    Discharge Exam: General appearance: alert, cooperative and no distress Extremities: Homans sign is negative, no sign of DVT, no edema, redness or tenderness in the calves or thighs and no ulcers, gangrene or trophic changes  Disposition:    Skilled  nursing facility with follow up in 2 weeks   Follow-up Information    Follow up with Mauri Pole, MD. Schedule an appointment as soon as possible for a visit in 2 weeks.   Specialty:  Orthopedic Surgery   Contact information:   [address] [place] [address] [phone]       Follow up with [facility] .   Specialty:  [place]  information:   [address] [place] [phone]      Discharge Instructions    Call MD / Call 911    Complete by:  As directed   If you experience chest pain or shortness of breath, CALL 911 and be transported to the hospital emergency room.  If you develope a fever above 101 F, pus (white drainage) or increased drainage or redness at the wound, or calf pain, call your surgeon's office.     Change dressing    Complete by:  As directed   Maintain surgical dressing until follow up in the clinic. If the edges start to pull up, may reinforce with tape. If the dressing is no longer working, may remove and cover with gauze and tape, but must keep the area dry and clean.  Call with any questions or concerns.     Constipation Prevention    Complete by:  As directed   Drink plenty of fluids.  Prune juice may be helpful.  You may use a stool softener, such as Colace (over the counter) 100 mg twice a day.  Use MiraLax (over the counter) for constipation as needed.     Diet - low sodium heart healthy    Complete by:  As directed      Discharge instructions    Complete by:  As directed   Maintain surgical dressing until follow up in the clinic. If the edges start to pull up, may reinforce with tape. If the dressing is no longer working, may remove and cover with gauze and tape, but must keep the area dry and clean.  Follow up in 2 weeks at [facility]. Call with any questions or concerns.     Increase activity slowly as tolerated    Complete by:  As directed   Weight bearing as tolerated with assist device (walker, cane, etc) as directed, use it as long as suggested by your surgeon or therapist, typically at least 4-6 weeks.     TED hose    Complete by:  As directed   Use stockings (TED hose) for 2 weeks on both leg(s).  You may remove them at night for sleeping.             Medication List    STOP taking these medications        acetaminophen 500 MG  tablet  Commonly known as:  TYLENOL     DEXILANT 60 MG capsule  Generic drug:  dexlansoprazole     traMADol 50 MG tablet  Commonly known as:  ULTRAM     VSL#3 Caps      TAKE these medications        ALPRAZolam 0.25 MG tablet  Commonly known as:  XANAX  Take 1 tablet (0.25 mg total) by mouth 4 (four) times daily as needed. ANXIETY/ SPASM     amLODipine 2.5 MG tablet  Commonly known as:  NORVASC  Take 2.5 mg by mouth every morning.     aspirin 325 MG  EC tablet  Take 1 tablet (325 mg total) by mouth 2 (two) times daily.     docusate sodium 100 MG capsule  Commonly known as:  COLACE  Take 1 capsule (100 mg total) by mouth 2 (two) times daily.     ferrous sulfate 325 (65 FE) MG tablet  Take 1 tablet (325 mg total) by mouth 3 (three) times daily after meals.     fish oil-omega-3 fatty acids 1000 MG capsule  Take 3 g by mouth daily.     HYDROcodone-acetaminophen 7.5-325 MG tablet  Commonly known as:  NORCO  Take 1-2 tablets by mouth every 4 (four) hours as needed for moderate pain.     loratadine 10 MG tablet  Commonly known as:  CLARITIN  Take 10 mg by mouth daily as needed for allergies.     OVER THE COUNTER MEDICATION  Take 1 tablet by mouth 3 (three) times daily as needed (pain). Curamin     polyethylene glycol packet  Commonly known as:  MIRALAX / GLYCOLAX  Take 17 g by mouth 2 (two) times daily.     ramipril 10 MG tablet  Commonly known as:  ALTACE  Take 10 mg by mouth 2 (two) times daily.     tiZANidine 4 MG tablet  Commonly known as:  ZANAFLEX  Take 1 tablet (4 mg total) by mouth every 6 (six) hours as needed for muscle spasms.     Vitamin D (Ergocalciferol) 50000 UNITS Caps capsule  Commonly known as:  DRISDOL  Take 50,000 Units by mouth every 7 (seven) days. Friday         Signed: [place] Pugh. Nolie Bignell   PA-C  11/15/2014, 8:41 AM

## 2014-11-15 NOTE — Progress Notes (Signed)
Physical Therapy Treatment Patient Details Name: [name] [name] MRN: [id number] DOB: [birth year] Today's Date: 11/15/2014    History of Present Illness s/p L TKA, hx of R TKA and revision in 2014 (R knee stiffness and swelling limits AROM)    PT Comments    Pt is progressing well toward her goals. She is transferring to SNF for further rehabilitation.   Follow Up Recommendations  SNF     Equipment Recommendations  None recommended by PT    Recommendations for Other Services       Precautions / Restrictions Precautions Precautions: Knee;Fall Restrictions Weight Bearing Restrictions: No LLE Weight Bearing: Weight bearing as tolerated    Mobility  Bed Mobility Overal bed mobility: Needs Assistance Bed Mobility: Supine to Sit     Supine to sit: Min assist;HOB elevated     General bed mobility comments: VC's  ~ 25% for pushing up with UEs  Transfers Overall transfer level: Needs assistance Equipment used: Rolling walker (2 wheeled) Transfers: Sit to/from Stand Sit to Stand: Min assist         General transfer comment: assisted with trunk extension, VC's ~ 25% to put more weight through UE to come to standing position   Ambulation/Gait Ambulation/Gait assistance: Min assist Ambulation Distance (Feet): 75 Feet Assistive device: Rolling walker (2 wheeled) Gait Pattern/deviations: Step-through pattern;Decreased stride length;Antalgic Gait velocity: decreased   General Gait Details: did better shifting weight on LLE, VC's for posture and L limb advancement    Stairs            Wheelchair Mobility    Modified Rankin (Stroke Patients Only)       Balance                                    Cognition Arousal/Alertness: Awake/alert Behavior During Therapy: WFL for tasks assessed/performed Overall Cognitive Status: Within Functional Limits for tasks assessed                      Exercises Total Joint Exercises Ankle  Circles/Pumps: AROM;Strengthening;20 reps;Both Quad Sets: Strengthening;15 reps;Both Towel Squeeze: Strengthening;Both;10 reps Hip ABduction/ADduction: AROM;Strengthening;Left;15 reps Straight Leg Raises: AROM;Strengthening;Left;15 reps Long Arc Quad: Strengthening;AAROM;Left;10 reps Knee Flexion: AAROM;Left;10 reps Goniometric ROM: 60 degrees knee flexion, lacking 10 degrees knee extension  Marching in Standing: AROM;Both;15 reps    General Comments        Pertinent Vitals/Pain Pain Score: 7  Pain Location: L knee Pain Descriptors / Indicators: Sore Pain Intervention(s): Limited activity within patient's tolerance;Monitored during session;Repositioned;Ice applied    Home Living                      Prior Function            PT Goals (current goals can now be found in the care plan section) Acute Rehab PT Goals Patient Stated Goal: go to rehab, work hard, and get back home, being able to go back to aquatic exercises 2 x week PT Goal Formulation: With patient Time For Goal Achievement: 11/21/14 Potential to Achieve Goals: Good Progress towards PT goals: Progressing toward goals    Frequency  7X/week    PT Plan Current plan remains appropriate    Co-evaluation             End of Session Equipment Utilized During Treatment: Gait belt Activity Tolerance: Patient tolerated treatment well Patient left: in chair;with  call bell/phone within reach     Time: 0910-0946 PT Time Calculation (min) (ACUTE ONLY): 36 min  Charges:  $Gait Training: 8-22 mins $Therapeutic Exercise: 8-22 mins                    G Codes:      [place], SPT 2014-11-18, 3:25 PM

## 2014-11-15 NOTE — Progress Notes (Signed)
     Subjective: 2 Days Post-Op Procedure(s) (LRB): TOTAL KNEE ARTHROPLASTY (Left)   Patient reports pain as mild, pain controlled. No events throughout the night. Feels that she worked well with PT yesterday.  Objective:   VITALS:   Filed Vitals:   11/15/14 0507  BP: 125/56  Pulse: 65  Temp: 97.6 F (36.4 C)  Resp: 16    Dorsiflexion/Plantar flexion intact Incision: dressing C/D/I No cellulitis present Compartment soft  LABS  Recent Labs  11/14/14 0555 11/15/14 0528  HGB 8.4* 7.6*  HCT 26.7* 24.0*  WBC 10.0 8.6  PLT 191 163     Recent Labs  11/14/14 0555 11/15/14 0528  NA 135 133*  K 5.0 4.3  BUN 18 21*  CREATININE 1.07* 1.14*  GLUCOSE 110* 99     Assessment/Plan: 2 Days Post-Op Procedure(s) (LRB): TOTAL KNEE ARTHROPLASTY (Left) Up with therapy Discharge to SNF Follow up in 2 weeks at [facility]. Follow up with OLIN,Whitney Hillegass D in 2 weeks.  Contact information:  [facility] [address] [place] [phone]    Overweight (BMI 25-29.9) Estimated body mass index is 27.83 kg/(m^2) as calculated from the following:   Height as of this encounter: 5\' 4"  (1.626 m).   Weight as of this encounter: 73.568 kg (162 lb 3 oz). Patient also counseled that weight may inhibit the healing process Patient counseled that losing weight will help with future health issues      West Pugh. Shadrack Brummitt   PAC  11/15/2014, 8:08 AM

## 2014-11-15 NOTE — Progress Notes (Signed)
Pt medically ready for d/c today to [facility].  Pt in need for ambulance transport to SNF.  BSW Intern met w/ pt at bedside and informed pt of transport and d/c to SNF today.   BSW Intern to call [facility] and provide RN w/ number to call Report.   No further SW needs identified at this time.  Signing off, Harlon Flor, [place] Work Department  [phone]

## 2015-03-22 ENCOUNTER — Emergency Department (HOSPITAL_COMMUNITY)
Admission: EM | Admit: 2015-03-22 | Discharge: 2015-03-22 | Disposition: A | Discharge status: Home / Self Care | Payer: Medicare Other | Source: Home / Self Care | Attending: Family Medicine | Admitting: Family Medicine

## 2015-03-22 ENCOUNTER — Encounter (HOSPITAL_COMMUNITY): Payer: Self-pay | Admitting: *Deleted

## 2015-03-22 DIAGNOSIS — S61219A Laceration without foreign body of unspecified finger without damage to nail, initial encounter: Secondary | ICD-10-CM

## 2015-03-22 NOTE — Discharge Instructions (Signed)
Keep bandaged until sun then wash and cover as needed, return as needed.

## 2015-03-22 NOTE — ED Notes (Signed)
Wound  Soaked  In  Betadine  Then  Wrapped  With  Xeroform  And  dsd

## 2015-03-22 NOTE — ED Notes (Signed)
Pt   States    She  Was  Cutting  Cabbage    Today   And  Cut  r   Affected  Finger      With a  Knife    -  Avulsion  To    Tip  Bleeding  Subsided  No  Other injurys

## 2015-03-22 NOTE — ED Provider Notes (Signed)
CSN: [id number]     Arrival date & time 03/22/15  1305 History   First MD Initiated Contact with Patient 03/22/15 1339     Chief Complaint  Patient presents with  . Finger Injury   (Consider location/radiation/quality/duration/timing/severity/associated sxs/prior Treatment) Patient is a 82 y.o. female presenting with skin laceration. The history is provided by the patient.  Laceration Location:  Finger Finger laceration location:  R middle finger Depth:  Cutaneous Quality: avulsion   Bleeding: controlled   Time since incident:  4 hours Laceration mechanism:  Knife (cut on veg slicing machine making lunch at home with superficial nail avulsion.) Foreign body present:  No foreign bodies Relieved by:  None tried Worsened by:  Nothing tried Ineffective treatments:  None tried Tetanus status:  Up to date   Past Medical History  Diagnosis Date  . Hypertension   . GERD (gastroesophageal reflux disease)   . MVP (mitral valve prolapse)   . PVC's (premature ventricular contractions)   . Hiatal hernia   . Anemia   . Fatty liver   . Blind loop syndrome   . Schatzki's ring   . Vitamin B12 deficiency   . 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 Allergy     SEASONAL  . Arthritis     KNEES/HANDS  . Anxiety   . Complication of anesthesia     pt can't remember exact complication, but she remembers about 30 years ago she may have had trouble waking up from anesthesia.   Past Surgical History  Procedure Laterality Date  . Hemicolectomy      and ileectomy Dr Vida Rigger; due to perforated viscus  . Rotator cuff repair  03-26-11    right arm  . Nuclear stress test  Sept 2012    Normal  . Replacement total knee      right  . Knee closed reduction  01/22/2011    Procedure: CLOSED MANIPULATION KNEE;  Surgeon: Mcarthur Rossetti;  Location: [facility];  Service: Orthopedics;  Laterality: Right;  Manipulation under anesthesia right knee  . Cataract extraction, bilateral  03-26-11  . I&d  knee with poly exchange  04/03/2011    Procedure: IRRIGATION AND DEBRIDEMENT KNEE WITH POLY EXCHANGE;  Surgeon: Mcarthur Rossetti, MD;  Location: [facility];  Service: Orthopedics;  Laterality: Right;  . Synovectomy  04/03/2011    Procedure: SYNOVECTOMY;  Surgeon: Mcarthur Rossetti, MD;  Location: [facility];  Service: Orthopedics;  Laterality: Right;  . Tubal ligation    . Total knee revision Right 07/14/2012    Procedure: RIGHT TOTAL KNEE REVISION;  Surgeon: Mauri Pole, MD;  Location: [facility];  Service: Orthopedics;  Laterality: Right;  . Total knee arthroplasty Left 11/13/2014    Procedure: TOTAL KNEE ARTHROPLASTY;  Surgeon: Paralee Cancel, MD;  Location: [facility];  Service: Orthopedics;  Laterality: Left;   Family History  Problem Relation Age of Onset  . Hypertension Sister   . Diabetes Sister   . Diabetes Brother   . Coronary artery disease Brother   . Deep vein thrombosis Sister   . Colon cancer Neg Hx   . Colon polyps Neg Hx    Social History  Substance Use Topics  . Smoking status: Never Smoker   . Smokeless tobacco: Never Used  . Alcohol Use: No   OB History    No data available     Review of Systems  Constitutional: Negative.   Skin: Positive for wound.  All other systems reviewed and  are negative.   Allergies  Review of patient's allergies indicates no known allergies.  Home Medications   Prior to Admission medications   Medication Sig Start Date End Date Taking? Authorizing Provider  ALPRAZolam (XANAX) 0.25 MG tablet Take 1 tablet (0.25 mg total) by mouth 4 (four) times daily as needed. ANXIETY/ SPASM 07/15/12   Danae Orleans, PA-C  amLODipine (NORVASC) 2.5 MG tablet Take 2.5 mg by mouth every morning.    Historical Provider, MD  docusate sodium (COLACE) 100 MG capsule Take 1 capsule (100 mg total) by mouth 2 (two) times daily. 11/15/14   Danae Orleans, PA-C  ferrous sulfate 325 (65 FE) MG tablet Take 1 tablet (325 mg total) by mouth 3 (three) times daily  after meals. 11/15/14   Danae Orleans, PA-C  fish oil-omega-3 fatty acids 1000 MG capsule Take 3 g by mouth daily.     Historical Provider, MD  HYDROcodone-acetaminophen (NORCO) 7.5-325 MG tablet Take 1-2 tablets by mouth every 4 (four) hours as needed for moderate pain. 11/15/14   Danae Orleans, PA-C  loratadine (CLARITIN) 10 MG tablet Take 10 mg by mouth daily as needed for allergies.     Historical Provider, MD  OVER THE COUNTER MEDICATION Take 1 tablet by mouth 3 (three) times daily as needed (pain). Curamin    Historical Provider, MD  polyethylene glycol (MIRALAX / GLYCOLAX) packet Take 17 g by mouth 2 (two) times daily. 11/15/14   Danae Orleans, PA-C  ramipril (ALTACE) 10 MG tablet Take 10 mg by mouth 2 (two) times daily.      Historical Provider, MD  tiZANidine (ZANAFLEX) 4 MG tablet Take 1 tablet (4 mg total) by mouth every 6 (six) hours as needed for muscle spasms. 11/15/14   Danae Orleans, PA-C  Vitamin D, Ergocalciferol, (DRISDOL) 50000 UNITS CAPS Take 50,000 Units by mouth every 7 (seven) days. Friday     Historical Provider, MD   Meds Ordered and Administered this Visit  Medications - No data to display  BP 153/80 mmHg  Pulse 81  Temp(Src) 97.9 F (36.6 C) (Oral)  Resp 18  SpO2 98% No data found.   Physical Exam  Constitutional: She is oriented to person, place, and time. She appears well-developed and well-nourished. No distress.  Musculoskeletal: She exhibits tenderness.       Right hand: She exhibits normal range of motion.       Hands: Neurological: She is alert and oriented to person, place, and time.  Skin: Skin is warm.  Nursing note and vitals reviewed.   ED Course  Procedures (including critical care time)  Labs Review Labs Reviewed - No data to display  Imaging Review No results found.   Visual Acuity Review  Right Eye Distance:   Left Eye Distance:   Bilateral Distance:    Right Eye Near:   Left Eye Near:    Bilateral Near:          MDM   1. Finger laceration, initial encounter        Billy Fischer, MD 03/22/15 1354

## 2015-03-24 ENCOUNTER — Encounter (HOSPITAL_COMMUNITY): Payer: Self-pay | Admitting: *Deleted

## 2015-03-24 ENCOUNTER — Emergency Department (HOSPITAL_COMMUNITY): Payer: Medicare Other

## 2015-03-24 ENCOUNTER — Inpatient Hospital Stay (HOSPITAL_COMMUNITY)
Admission: EM | Admit: 2015-03-24 | Discharge: 2015-03-27 | DRG: 388 | Disposition: A | Discharge status: Home / Self Care | Payer: Medicare Other | Attending: Internal Medicine | Admitting: Internal Medicine

## 2015-03-24 DIAGNOSIS — D509 Iron deficiency anemia, unspecified: Secondary | ICD-10-CM | POA: Diagnosis present

## 2015-03-24 DIAGNOSIS — W260XXA Contact with knife, initial encounter: Secondary | ICD-10-CM | POA: Diagnosis present

## 2015-03-24 DIAGNOSIS — K760 Fatty (change of) liver, not elsewhere classified: Secondary | ICD-10-CM | POA: Diagnosis present

## 2015-03-24 DIAGNOSIS — K5660 Unspecified intestinal obstruction: Secondary | ICD-10-CM | POA: Diagnosis not present

## 2015-03-24 DIAGNOSIS — Z833 Family history of diabetes mellitus: Secondary | ICD-10-CM | POA: Diagnosis not present

## 2015-03-24 DIAGNOSIS — Z9049 Acquired absence of other specified parts of digestive tract: Secondary | ICD-10-CM | POA: Diagnosis not present

## 2015-03-24 DIAGNOSIS — E538 Deficiency of other specified B group vitamins: Secondary | ICD-10-CM | POA: Diagnosis present

## 2015-03-24 DIAGNOSIS — F419 Anxiety disorder, unspecified: Secondary | ICD-10-CM | POA: Diagnosis present

## 2015-03-24 DIAGNOSIS — Z8249 Family history of ischemic heart disease and other diseases of the circulatory system: Secondary | ICD-10-CM | POA: Diagnosis not present

## 2015-03-24 DIAGNOSIS — Y9389 Activity, other specified: Secondary | ICD-10-CM | POA: Diagnosis not present

## 2015-03-24 DIAGNOSIS — Z79899 Other long term (current) drug therapy: Secondary | ICD-10-CM | POA: Diagnosis not present

## 2015-03-24 DIAGNOSIS — K219 Gastro-esophageal reflux disease without esophagitis: Secondary | ICD-10-CM | POA: Diagnosis present

## 2015-03-24 DIAGNOSIS — I10 Essential (primary) hypertension: Secondary | ICD-10-CM | POA: Diagnosis present

## 2015-03-24 DIAGNOSIS — J189 Pneumonia, unspecified organism: Secondary | ICD-10-CM | POA: Diagnosis not present

## 2015-03-24 DIAGNOSIS — I341 Nonrheumatic mitral (valve) prolapse: Secondary | ICD-10-CM | POA: Diagnosis present

## 2015-03-24 DIAGNOSIS — J690 Pneumonitis due to inhalation of food and vomit: Secondary | ICD-10-CM | POA: Diagnosis present

## 2015-03-24 DIAGNOSIS — J181 Lobar pneumonia, unspecified organism: Secondary | ICD-10-CM

## 2015-03-24 DIAGNOSIS — S61212A Laceration without foreign body of right middle finger without damage to nail, initial encounter: Secondary | ICD-10-CM | POA: Diagnosis present

## 2015-03-24 DIAGNOSIS — R1013 Epigastric pain: Secondary | ICD-10-CM | POA: Diagnosis present

## 2015-03-24 DIAGNOSIS — K5669 Other intestinal obstruction: Secondary | ICD-10-CM | POA: Diagnosis not present

## 2015-03-24 DIAGNOSIS — K902 Blind loop syndrome, not elsewhere classified: Secondary | ICD-10-CM | POA: Diagnosis present

## 2015-03-24 DIAGNOSIS — K920 Hematemesis: Secondary | ICD-10-CM | POA: Diagnosis present

## 2015-03-24 DIAGNOSIS — K56609 Unspecified intestinal obstruction, unspecified as to partial versus complete obstruction: Secondary | ICD-10-CM | POA: Diagnosis present

## 2015-03-24 DIAGNOSIS — Z96652 Presence of left artificial knee joint: Secondary | ICD-10-CM | POA: Diagnosis present

## 2015-03-24 LAB — COMPREHENSIVE METABOLIC PANEL
ALT: 19 U/L (ref 14–54)
AST: 27 U/L (ref 15–41)
Albumin: 4.3 g/dL (ref 3.5–5.0)
Alkaline Phosphatase: 68 U/L (ref 38–126)
Anion gap: 13 (ref 5–15)
BUN: 13 mg/dL (ref 6–20)
CHLORIDE: 97 mmol/L — AB (ref 101–111)
CO2: 29 mmol/L (ref 22–32)
CREATININE: 0.97 mg/dL (ref 0.44–1.00)
Calcium: 10.3 mg/dL (ref 8.9–10.3)
GFR calc Af Amer: >60 mL/min (ref >60)
GFR calc non Af Amer: 52 mL/min — ABNORMAL LOW (ref >60)
Glucose, Bld: 130 mg/dL — ABNORMAL HIGH (ref 65–99)
Potassium: 3.9 mmol/L (ref 3.5–5.1)
SODIUM: 139 mmol/L (ref 135–145)
Total Bilirubin: 0.4 mg/dL (ref 0.3–1.2)
Total Protein: 8.1 g/dL (ref 6.5–8.1)

## 2015-03-24 LAB — CBC
HCT: 41.7 % (ref 36.0–46.0)
Hemoglobin: 13.2 g/dL (ref 12.0–15.0)
MCH: 25.7 pg — AB (ref 26.0–34.0)
MCHC: 31.7 g/dL (ref 30.0–36.0)
MCV: 81.1 fL (ref 78.0–100.0)
PLATELETS: 286 10*3/uL (ref 150–400)
RBC: 5.14 MIL/uL — ABNORMAL HIGH (ref 3.87–5.11)
RDW: 16.9 % — AB (ref 11.5–15.5)
WBC: 9.4 10*3/uL (ref 4.0–10.5)

## 2015-03-24 LAB — URINALYSIS, ROUTINE W REFLEX MICROSCOPIC
Bilirubin Urine: NEGATIVE
GLUCOSE, UA: NEGATIVE mg/dL
HGB URINE DIPSTICK: NEGATIVE
KETONES UR: NEGATIVE mg/dL
Leukocytes, UA: NEGATIVE
Nitrite: NEGATIVE
PROTEIN: NEGATIVE mg/dL
Specific Gravity, Urine: 1.029 (ref 1.005–1.030)
pH: 8.5 — ABNORMAL HIGH (ref 5.0–8.0)

## 2015-03-24 LAB — I-STAT CG4 LACTIC ACID, ED: Lactic Acid, Venous: 1.92 mmol/L (ref 0.5–2.0)

## 2015-03-24 LAB — LIPASE, BLOOD: LIPASE: 39 U/L (ref 11–51)

## 2015-03-24 MED ORDER — DEXTROSE 5 % IV SOLN
1.0000 g | INTRAVENOUS | Status: DC
  Administered 2015-03-24 – 2015-03-26 (×3): 1 g via INTRAVENOUS
  Filled 2015-03-24 (×4): qty 10

## 2015-03-24 MED ORDER — ALPRAZOLAM 0.25 MG PO TABS: 0.2500 mg | ORAL_TABLET | Freq: Four times a day (QID) | ORAL | Status: DC | PRN

## 2015-03-24 MED ORDER — SODIUM CHLORIDE 0.9 % IV SOLN
Freq: Once | INTRAVENOUS | Status: AC
  Administered 2015-03-26: 21:00:00 via INTRAVENOUS

## 2015-03-24 MED ORDER — IOHEXOL 300 MG/ML  SOLN
100.0000 mL | Freq: Once | INTRAMUSCULAR | Status: AC | PRN
  Administered 2015-03-24: 100 mL via INTRAVENOUS

## 2015-03-24 MED ORDER — CHLORHEXIDINE GLUCONATE 0.12 % MT SOLN
15.0000 mL | Freq: Two times a day (BID) | OROMUCOSAL | Status: DC
  Administered 2015-03-24 – 2015-03-26 (×5): 15 mL via OROMUCOSAL
  Filled 2015-03-24 (×7): qty 15

## 2015-03-24 MED ORDER — PROMETHAZINE HCL 25 MG/ML IJ SOLN
12.5000 mg | Freq: Once | INTRAMUSCULAR | Status: AC
  Administered 2015-03-24: 12.5 mg via INTRAVENOUS
  Filled 2015-03-24: qty 1

## 2015-03-24 MED ORDER — PANTOPRAZOLE SODIUM 40 MG IV SOLR
40.0000 mg | Freq: Once | INTRAVENOUS | Status: AC
  Administered 2015-03-24: 40 mg via INTRAVENOUS
  Filled 2015-03-24: qty 40

## 2015-03-24 MED ORDER — SODIUM CHLORIDE 0.9 % IV BOLUS (SEPSIS)
500.0000 mL | Freq: Once | INTRAVENOUS | Status: AC
  Administered 2015-03-24: 500 mL via INTRAVENOUS

## 2015-03-24 MED ORDER — MORPHINE SULFATE (PF) 4 MG/ML IV SOLN
4.0000 mg | INTRAVENOUS | Status: DC | PRN
  Administered 2015-03-24: 4 mg via INTRAVENOUS
  Filled 2015-03-24: qty 1

## 2015-03-24 MED ORDER — ONDANSETRON HCL 4 MG/2ML IJ SOLN
4.0000 mg | Freq: Once | INTRAMUSCULAR | Status: AC
  Administered 2015-03-24: 4 mg via INTRAVENOUS
  Filled 2015-03-24: qty 2

## 2015-03-24 MED ORDER — DEXTROSE 5 % IV SOLN
500.0000 mg | INTRAVENOUS | Status: DC
  Administered 2015-03-24 – 2015-03-26 (×3): 500 mg via INTRAVENOUS
  Filled 2015-03-24 (×4): qty 500

## 2015-03-24 MED ORDER — ENOXAPARIN SODIUM 40 MG/0.4ML ~~LOC~~ SOLN
40.0000 mg | SUBCUTANEOUS | Status: DC
  Administered 2015-03-24 – 2015-03-26 (×3): 40 mg via SUBCUTANEOUS
  Filled 2015-03-24 (×4): qty 0.4

## 2015-03-24 MED ORDER — IOHEXOL 300 MG/ML  SOLN
50.0000 mL | Freq: Once | INTRAMUSCULAR | Status: AC | PRN
  Administered 2015-03-24: 50 mL via ORAL

## 2015-03-24 MED ORDER — CETYLPYRIDINIUM CHLORIDE 0.05 % MT LIQD
7.0000 mL | Freq: Two times a day (BID) | OROMUCOSAL | Status: DC
  Administered 2015-03-24 – 2015-03-25 (×3): 7 mL via OROMUCOSAL

## 2015-03-24 NOTE — Progress Notes (Signed)
Utilization Review Completed.[name] T3/05/2015  

## 2015-03-24 NOTE — H&P (Addendum)
[facility] History and Physical  [name] [name] [id number] DOB: 03-25-1928 DOA: 03/24/2015  Referring physician: ED physician, Dr. Jeneen Rinks  PCP: [name], MD   Chief Complaint: abd pain, nausea and vomiting   HPI:  Pt is 82 yo female who presents to [facility] ED with main concern of several days duration of progressively worsening epigastric pain, intermittent in nature and throbbing, occasionally sharp, radiating to lower abd quadrants, 7/10 in severity when present, associated with nausea and non bloody vomiting, poor oral intake, malaise. Pt denies fevers, chills, chest pain or shortness of breath, no similar events in the past, no urinary concerns.   In ED, pt noted to be hemodynamically stable, VSS, blood work unremarkable, CT abd concerning for SBO, RLL PNA. TRH asked to admit for further evaluation adn surgery team consulted.   Assessment and Plan: Active Problems: SBO - appreciate surgery team following  - keep NPO, NGT placed - provide IVF, antiemetics and analgesia as needed  - follow up on surgery team recommendations   RLL PNA - started Zithromax and Rocephin - sputum culture, urine legionella and strep pneumo requested - narrow down ABX as clinically indicated   Essential HTN - resume home meds once pt able to tolerate PO   Lovenox SQ for DVT prophylaxis   Radiological Exams on Admission: Ct Abdomen Pelvis W Contrast  03/24/2015 Small bowel obstruction, probably in the mid ileal region. Exact etiology not determined. Surgical clips at the tip of the cecum consistent with previous appendectomy. Abnormal density in the right lower lobe consistent with pneumonia.   Code Status: Full Family Communication: Pt at bedside Disposition Plan: Admit for further evaluation    [facility] [id number]   Review of Systems:  Constitutional: Negative for diaphoresis.  HENT: Negative for hearing loss, ear pain, nosebleeds, congestion, sore throat, neck pain, tinnitus  and ear discharge.   Eyes: Negative for blurred vision, double vision, photophobia, pain, discharge and redness.  Respiratory: Negative for wheezing and stridor.   Cardiovascular: Negative for chest pain, palpitations, orthopnea, claudication and leg swelling.  Gastrointestinal: Per HPI Genitourinary: Negative for dysuria, urgency, frequency, hematuria and flank pain.  Musculoskeletal: Negative for myalgias, back pain, joint pain and falls.  Skin: Negative for itching and rash.  Neurological: Negative for dizziness and weakness.  Endo/Heme/Allergies: Negative for environmental allergies and polydipsia. Does not bruise/bleed easily.  Psychiatric/Behavioral: Negative for suicidal ideas. The patient is not nervous/anxious.      Past Medical History  Diagnosis Date  . Hypertension   . GERD (gastroesophageal reflux disease)   . MVP (mitral valve prolapse)   . PVC's (premature ventricular contractions)   . Hiatal hernia   . Anemia   . Fatty liver   . Blind loop syndrome   . Schatzki's ring   . Vitamin B12 deficiency   . 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 Allergy     SEASONAL  . Arthritis     KNEES/HANDS  . Anxiety   . Complication of anesthesia     pt can't remember exact complication, but she remembers about 30 years ago she may have had trouble waking up from anesthesia.    Past Surgical History  Procedure Laterality Date  . Hemicolectomy      and ileectomy Dr Vida Rigger; due to perforated viscus  . Rotator cuff repair  03-26-11    right arm  . Nuclear stress test  Sept 2012    Normal  . Replacement total knee  right  . Knee closed reduction  01/22/2011    Procedure: CLOSED MANIPULATION KNEE;  Surgeon: Mcarthur Rossetti;  Location: [place];  Service: Orthopedics;  Laterality: Right;  Manipulation under anesthesia right knee  . Cataract extraction, bilateral  03-26-11  . I&d knee with poly exchange  04/03/2011    Procedure: IRRIGATION AND DEBRIDEMENT KNEE  WITH POLY EXCHANGE;  Surgeon: Mcarthur Rossetti, MD;  Location: [facility];  Service: Orthopedics;  Laterality: Right;  . Synovectomy  04/03/2011    Procedure: SYNOVECTOMY;  Surgeon: Mcarthur Rossetti, MD;  Location: [facility];  Service: Orthopedics;  Laterality: Right;  . Tubal ligation    . Total knee revision Right 07/14/2012    Procedure: RIGHT TOTAL KNEE REVISION;  Surgeon: Mauri Pole, MD;  Location: [facility];  Service: Orthopedics;  Laterality: Right;  . Total knee arthroplasty Left 11/13/2014    Procedure: TOTAL KNEE ARTHROPLASTY;  Surgeon: Paralee Cancel, MD;  Location: [facility];  Service: Orthopedics;  Laterality: Left;    Social History:  reports that she has never smoked. She has never used smokeless tobacco. She reports that she does not drink alcohol or use illicit drugs.  No Known Allergies  Family History  Problem Relation Age of Onset  . Hypertension Sister   . Diabetes Sister   . Diabetes Brother   . Coronary artery disease Brother   . Deep vein thrombosis Sister   . Colon cancer Neg Hx   . Colon polyps Neg Hx     Medication Sig  ALPRAZolam (XANAX) 0.25 MG tablet Take 1 tablet (0.25 mg total) by mouth 4 (four) times daily as needed. ANXIETY/ SPASM Patient taking differently: Take 0.25 mg by mouth 4 (four) times daily as needed for anxiety.   amLODipine (NORVASC) 5 MG tablet Take 5 mg by mouth daily.  ramipril (ALTACE) 10 MG tablet Take 10 mg by mouth 2 (two) times daily.    simethicone (MYLICON) 80 MG chewable tablet Chew 80 mg by mouth every 6 (six) hours as needed for flatulence.  traMADol (ULTRAM) 50 MG tablet Take 50-100 mg by mouth See admin instructions.  Every 6-8 hours as needed for pain  ferrous sulfate 325 (65 FE) MG tablet Take 1 tablet (325 mg total) by mouth 3 (three) times daily after meals. Patient not taking: Reported on 03/24/2015  HYDROcodone-acetaminophen (NORCO) 7.5-325 MG tablet Take 1-2 tablets by mouth every 4 (four) hours as needed for moderate  pain. Patient not taking: Reported on 03/24/2015    Physical Exam: Filed Vitals:   03/24/15 0810 03/24/15 1048  BP: 147/84 151/70  Pulse: 104 79  Temp: 97.8 F (36.6 C) 98.1 F (36.7 C)  TempSrc: Oral Oral  Resp: 18 20  Weight: 70.308 kg (155 lb)   SpO2: 95% 97%    Physical Exam  Constitutional: Appears well-developed and well-nourished. No distress.  HENT: Normocephalic. External right and left ear normal. Dry MM Eyes: Conjunctivae and EOM are normal. PERRLA, no scleral icterus.  Neck: Normal ROM. Neck supple. No JVD. No tracheal deviation. No thyromegaly.  CVS: RRR, no gallops, no carotid bruit.  Pulmonary: Effort and breath sounds normal, no stridor, rhonchi, wheezes, rales.  Abdominal: Soft. BS +,  no distension, tenderness, rebound or guarding.  Musculoskeletal: Normal range of motion. No edema and no tenderness.  Lymphadenopathy: No lymphadenopathy noted, cervical, inguinal. Neuro: Alert. Normal reflexes, muscle tone coordination. No cranial nerve deficit. Skin: Skin is warm and dry. No rash noted. Not diaphoretic. No erythema. No  pallor.  Psychiatric: Normal mood and affect. Behavior, judgment, thought content normal.   Labs on Admission:  Basic Metabolic Panel:  Recent Labs Lab 03/24/15 0850  NA 139  K 3.9  CL 97*  CO2 29  GLUCOSE 130*  BUN 13  CREATININE 0.97  CALCIUM 10.3   Liver Function Tests:  Recent Labs Lab 03/24/15 0850  AST 27  ALT 19  ALKPHOS 68  BILITOT 0.4  PROT 8.1  ALBUMIN 4.3    Recent Labs Lab 03/24/15 0850  LIPASE 39   CBC:  Recent Labs Lab 03/24/15 0850  WBC 9.4  HGB 13.2  HCT 41.7  MCV 81.1  PLT 286    EKG: pending   If 7PM-7AM, please contact night-coverage [email or web address] Password [email or web address] 03/24/2015, 12:06 PM

## 2015-03-24 NOTE — Progress Notes (Signed)
Pharmacy Antibiotic Follow-up Note  [name] [name] is a 82 y.o. year-old female admitted on 03/24/2015.  The patient is currently on day 1 of Rocephin & Azithromycin for CAP.  Assessment/Plan: This patient's current antibiotics will be continued without adjustments.  Neither antibiotic needs adjustment for renal function  Temp (24hrs), Avg:98.3 F (36.8 C), Min:97.8 F (36.6 C), Max:99.1 F (37.3 C)   Recent Labs Lab 03/24/15 0850  WBC 9.4    Recent Labs Lab 03/24/15 0850  CREATININE 0.97   Estimated Creatinine Clearance: 41.5 mL/min (by C-G formula based on Cr of 0.97).    No Known Allergies  Antimicrobials this admission: 3/5 Rocephin >>  3/5 Azithromycin >>   Thank you for allowing pharmacy to be a part of this patient's care.  Minda Ditto PharmD 03/24/2015 1:36 PM

## 2015-03-24 NOTE — ED Notes (Signed)
Patient transported to CT 

## 2015-03-24 NOTE — ED Notes (Signed)
Patient vomited immediately after drinking and continues to dry heave despite zofran administration.  Dr. Jeneen Rinks notified and new orders received.

## 2015-03-24 NOTE — ED Notes (Signed)
Pt aware of need for UA, will call when able to complete

## 2015-03-24 NOTE — ED Notes (Signed)
Pt reports low RLQ pain with bright red blood tinged emesis since last night.  Pt denies any lightheadedness or dizziness at this time.

## 2015-03-24 NOTE — ED Provider Notes (Signed)
CSN: [id number]     Arrival date & time 03/24/15  0750 History   First MD Initiated Contact with Patient 03/24/15 0818     Chief Complaint  Patient presents with  . Abdominal Pain  . Hematemesis      HPI  Patient presents for evaluation of abdominal pain and vomiting.  Symptoms started yesterday proximally 4 PM with some intermittent nonlocalized cramping abdominal pain.  11 or 12 PM she was feeling nauseated and had several episodes of emesis during the night. She developed a nosebleed during one episode of forceful emesis. Had some blood-tinged emesis following this. Has not had frank hematemesis. Pain is more right-sided mid abdominal. No back pain. No urinary symptoms. Has not had a bowel movement since this morning and has not passed gas. She describes it initially as "feeling like gas".  She states that 30 years ago she had abdominal surgery to "remove my guts". States this was done after "some kind of the tear". She cannot specify. Has not had difficulties with bowel obstructions, actually has never had a bowel obstruction following her surgery.  Past Medical History  Diagnosis Date  . Hypertension   . GERD (gastroesophageal reflux disease)   . MVP (mitral valve prolapse)   . PVC's (premature ventricular contractions)   . Hiatal hernia   . Anemia   . Fatty liver   . Blind loop syndrome   . Schatzki's ring   . Vitamin B12 deficiency   . 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 Allergy     SEASONAL  . Arthritis     KNEES/HANDS  . Anxiety   . Complication of anesthesia     pt can't remember exact complication, but she remembers about 30 years ago she may have had trouble waking up from anesthesia.   Past Surgical History  Procedure Laterality Date  . Hemicolectomy      and ileectomy Dr Vida Rigger; due to perforated viscus  . Rotator cuff repair  03-26-11    right arm  . Nuclear stress test  Sept 2012    Normal  . Replacement total knee      right  . Knee  closed reduction  01/22/2011    Procedure: CLOSED MANIPULATION KNEE;  Surgeon: Mcarthur Rossetti;  Location: [place];  Service: Orthopedics;  Laterality: Right;  Manipulation under anesthesia right knee  . Cataract extraction, bilateral  03-26-11  . I&d knee with poly exchange  04/03/2011    Procedure: IRRIGATION AND DEBRIDEMENT KNEE WITH POLY EXCHANGE;  Surgeon: Mcarthur Rossetti, MD;  Location: [facility];  Service: Orthopedics;  Laterality: Right;  . Synovectomy  04/03/2011    Procedure: SYNOVECTOMY;  Surgeon: Mcarthur Rossetti, MD;  Location: [facility];  Service: Orthopedics;  Laterality: Right;  . Tubal ligation    . Total knee revision Right 07/14/2012    Procedure: RIGHT TOTAL KNEE REVISION;  Surgeon: Mauri Pole, MD;  Location: [facility];  Service: Orthopedics;  Laterality: Right;  . Total knee arthroplasty Left 11/13/2014    Procedure: TOTAL KNEE ARTHROPLASTY;  Surgeon: Paralee Cancel, MD;  Location: [facility];  Service: Orthopedics;  Laterality: Left;   Family History  Problem Relation Age of Onset  . Hypertension Sister   . Diabetes Sister   . Diabetes Brother   . Coronary artery disease Brother   . Deep vein thrombosis Sister   . Colon cancer Neg Hx   . Colon polyps Neg Hx    Social History  Substance Use Topics  . Smoking status: Never Smoker   . Smokeless tobacco: Never Used  . Alcohol Use: No   OB History    No data available     Review of Systems  Constitutional: Negative for fever, chills, diaphoresis, appetite change and fatigue.  HENT: Positive for nosebleeds. Negative for mouth sores, sore throat and trouble swallowing.   Eyes: Negative for visual disturbance.  Respiratory: Negative for cough, chest tightness, shortness of breath and wheezing.   Cardiovascular: Negative for chest pain.  Gastrointestinal: Positive for nausea, vomiting and abdominal pain. Negative for diarrhea and abdominal distention.       Blood tinged emesis after recurrent vomiting  Endocrine:  Negative for polydipsia, polyphagia and polyuria.  Genitourinary: Negative for dysuria, frequency and hematuria.  Musculoskeletal: Negative for gait problem.  Skin: Negative for color change, pallor and rash.  Neurological: Negative for dizziness, syncope, light-headedness and headaches.  Hematological: Does not bruise/bleed easily.  Psychiatric/Behavioral: Negative for behavioral problems and confusion.      Allergies  Review of patient's allergies indicates no known allergies.  Home Medications   Prior to Admission medications   Medication Sig Start Date End Date Taking? Authorizing Provider  ALPRAZolam (XANAX) 0.25 MG tablet Take 1 tablet (0.25 mg total) by mouth 4 (four) times daily as needed. ANXIETY/ SPASM Patient taking differently: Take 0.25 mg by mouth 4 (four) times daily as needed for anxiety.  07/15/12  Yes Matthew Babish, PA-C  amLODipine (NORVASC) 5 MG tablet Take 5 mg by mouth daily. 03/21/15  Yes Historical Provider, MD  fish oil-omega-3 fatty acids 1000 MG capsule Take 1 g by mouth daily.    Yes Historical Provider, MD  loratadine (CLARITIN) 10 MG tablet Take 10 mg by mouth daily as needed for allergies.    Yes Historical Provider, MD  OVER THE COUNTER MEDICATION Take 1 tablet by mouth 3 (three) times daily as needed (pain). Curamin   Yes Historical Provider, MD  ramipril (ALTACE) 10 MG tablet Take 10 mg by mouth 2 (two) times daily.     Yes Historical Provider, MD  simethicone (MYLICON) 80 MG chewable tablet Chew 80 mg by mouth every 6 (six) hours as needed for flatulence.   Yes Historical Provider, MD  traMADol (ULTRAM) 50 MG tablet Take 50-100 mg by mouth See admin instructions.  Every 6-8 hours as needed for pain 03/09/15  Yes Historical Provider, MD  Vitamin D, Ergocalciferol, (DRISDOL) 50000 UNITS CAPS Take 50,000 Units by mouth every 7 (seven) days. Friday   Yes Historical Provider, MD  VOLTAREN 1 % GEL Apply 2 g topically 3 (three) times daily. To right knee as needed  for pain 12/26/14  Yes Historical Provider, MD  docusate sodium (COLACE) 100 MG capsule Take 1 capsule (100 mg total) by mouth 2 (two) times daily. Patient not taking: Reported on 03/24/2015 11/15/14   Danae Orleans, PA-C  ferrous sulfate 325 (65 FE) MG tablet Take 1 tablet (325 mg total) by mouth 3 (three) times daily after meals. Patient not taking: Reported on 03/24/2015 11/15/14   Danae Orleans, PA-C  HYDROcodone-acetaminophen (NORCO) 7.5-325 MG tablet Take 1-2 tablets by mouth every 4 (four) hours as needed for moderate pain. Patient not taking: Reported on 03/24/2015 11/15/14   Danae Orleans, PA-C  polyethylene glycol (MIRALAX / Floria Raveling) packet Take 17 g by mouth 2 (two) times daily. Patient not taking: Reported on 03/24/2015 11/15/14   Danae Orleans, PA-C  tiZANidine (ZANAFLEX) 4 MG tablet Take 1 tablet (4  mg total) by mouth every 6 (six) hours as needed for muscle spasms. Patient not taking: Reported on 03/24/2015 11/15/14   Danae Orleans, PA-C   BP 151/70 mmHg  Pulse 79  Temp(Src) 98.1 F (36.7 C) (Oral)  Resp 20  Wt 155 lb (70.308 kg)  SpO2 97% Physical Exam  Constitutional: She is oriented to person, place, and time. She appears well-developed and well-nourished. No distress.  82 year old female. Appears younger than her stated age. No acute distress.  HENT:  Head: Normocephalic.  Moist blood in the left naris. No obvious source of bleeding  Eyes: Conjunctivae are normal. Pupils are equal, round, and reactive to light. No scleral icterus.  Neck: Normal range of motion. Neck supple. No thyromegaly present.  Cardiovascular: Normal rate and regular rhythm.  Exam reveals no gallop and no friction rub.   No murmur heard. Pulmonary/Chest: Effort normal and breath sounds normal. No respiratory distress. She has no wheezes. She has no rales.  Abdominal: Soft. Bowel sounds are normal. She exhibits no distension. There is no tenderness. There is no rebound.    Musculoskeletal: Normal  range of motion.  Neurological: She is alert and oriented to person, place, and time.  Skin: Skin is warm and dry. No rash noted.  Psychiatric: She has a normal mood and affect. Her behavior is normal.    ED Course  Procedures (including critical care time) Labs Review Labs Reviewed  COMPREHENSIVE METABOLIC PANEL - Abnormal; Notable for the following:    Chloride 97 (*)    Glucose, Bld 130 (*)    GFR calc non Af Amer 52 (*)    All other components within normal limits  CBC - Abnormal; Notable for the following:    RBC 5.14 (*)    MCH 25.7 (*)    RDW 16.9 (*)    All other components within normal limits  LIPASE, BLOOD  URINALYSIS, ROUTINE W REFLEX MICROSCOPIC (NOT AT [facility])  I-STAT CG4 LACTIC ACID, ED    Imaging Review Ct Abdomen Pelvis W Contrast  03/24/2015  CLINICAL DATA:  New onset of right lower quadrant pain. Hematemesis. Symptoms began last night. EXAM: CT ABDOMEN AND PELVIS WITH CONTRAST TECHNIQUE: Multidetector CT imaging of the abdomen and pelvis was performed using the standard protocol following bolus administration of intravenous contrast. CONTRAST:  35mL OMNIPAQUE IOHEXOL 300 MG/ML SOLN, 122mL OMNIPAQUE IOHEXOL 300 MG/ML SOLN COMPARISON:  Ultrasound 09/03/2009 FINDINGS: There is infiltrate and volume loss in the right lower lobe consistent with pneumonia. The liver is normal except for a sub cm cyst at the dome. No calcified gallstones. The common duct is slightly prominent, but no obstructing lesion is seen. The spleen is normal. The pancreas is normal. The adrenal glands are normal. The kidneys are normal. There is atherosclerosis of the aorta but no aneurysm. The IVC is normal. No retroperitoneal mass or lymphadenopathy. The small bowel is dilated to the level of the mid ileum. The distal ileum does not appear dilated. There are surgical clips at the region of the cecum consistent with previous appendectomy. I do not identify a specific obstructing lesion. Uterus and adnexal  regions are unremarkable. There chronic degenerative changes of the lower lumbar spine. IMPRESSION: Small bowel obstruction, probably in the mid ileal region. Exact etiology not determined. Surgical clips at the tip of the cecum consistent with previous appendectomy. Abnormal density in the right lower lobe consistent with pneumonia. Electronically Signed   By: Nelson Chimes M.D.   On: 03/24/2015 10:37  I have personally reviewed and evaluated these images and lab results as part of my medical decision-making.   EKG Interpretation None      MDM   Final diagnoses:  Small bowel obstruction (HCC)  Aspiration pneumonia of right lower lobe, unspecified aspiration pneumonia type ([place])    Plan will be symptomatic treatment with pain medication and antiemetics. PPI, labs, urine, CT scan, reevaluation  12:-08:  Discussed with Dr. Doyle Askew. Patient be admitted. NG tube requested. Call placed to general surgery. She is asymptomatic for any symptoms of pneumonia. Did have some coughing and gagging yesterday. This may represent an aspiration. This was discussed with Dr. Doyle Askew. She states she will order additional therapeutics, and a bed request.    Tanna Furry, MD 03/24/15 1208

## 2015-03-24 NOTE — Consult Note (Signed)
Re:   [name] [name] DOB:   04-Jan-1929 MRN:   [id number]   [facility] Consultation  ASSESSMENT AND PLAN: 1.  Small bowel obstruction  Agree with IVF, NPO, repeat KUB and exams.   2.  HTN 3.  Anxiety 4.  Laceration of right middle finger - 03/22/2015  Chief Complaint  Patient presents with  . Abdominal Pain  . Hematemesis   REFERRING PHYSICIAN:  Dr. Tanna Furry, [facility]  HISTORY OF PRESENT ILLNESS: [name] [name] is a 82 y.o. (DOB: Jun 08, 1928)  AA  female whose primary care physician is [name], MD and comes to the [facility] ER today for nausea and abdominal pain. Her daughter, [name] [name], and her niece, [name] [name], are in the room.  She had a colon resection about 30 years ago.  She said that she had a colostomy for a while.  She is unsure of the surgeon or the reason for the surgery.  Dr. Philip Aspen followed her for years.   More recently, she has seen Dr. Benson Norway.  She had a negative colonoscopy in 2016. She had a TL about 1955. She has no history of stomach, liver, or pancreas disease.  CT scan 03/24/2015 - Small bowel obstruction, probably in the mid ileal region. Exact etiology not determined. Surgical clips at the tip of the cecum consistent with previous appendectomy.    Past Medical History  Diagnosis Date  . Hypertension   . GERD (gastroesophageal reflux disease)   . MVP (mitral valve prolapse)   . PVC's (premature ventricular contractions)   . Hiatal hernia   . Anemia   . Fatty liver   . Blind loop syndrome   . Schatzki's ring   . Vitamin B12 deficiency   . 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 Allergy     SEASONAL  . Arthritis     KNEES/HANDS  . Anxiety   . Complication of anesthesia     pt can't remember exact complication, but she remembers about 30 years ago she may have had trouble waking up from anesthesia.      Past Surgical History  Procedure Laterality Date  . Hemicolectomy      and ileectomy Dr Vida Rigger; due to perforated viscus  . Rotator cuff  repair  03-26-11    right arm  . Nuclear stress test  Sept 2012    Normal  . Replacement total knee      right  . Knee closed reduction  01/22/2011    Procedure: CLOSED MANIPULATION KNEE;  Surgeon: Mcarthur Rossetti;  Location: [place];  Service: Orthopedics;  Laterality: Right;  Manipulation under anesthesia right knee  . Cataract extraction, bilateral  03-26-11  . I&d knee with poly exchange  04/03/2011    Procedure: IRRIGATION AND DEBRIDEMENT KNEE WITH POLY EXCHANGE;  Surgeon: Mcarthur Rossetti, MD;  Location[facility];  Service: Orthopedics;  Laterality: Right;  . Synovectomy  04/03/2011    Procedure: SYNOVECTOMY;  Surgeon: Mcarthur Rossetti, MD;  Location: [facility];  Service: Orthopedics;  Laterality: Right;  . Tubal ligation    . Total knee revision Right 07/14/2012    Procedure: RIGHT TOTAL KNEE REVISION;  Surgeon: Mauri Pole, MD;  Location[facility];  Service: Orthopedics;  Laterality: Right;  . Total knee arthroplasty Left 11/13/2014    Procedure: TOTAL KNEE ARTHROPLASTY;  Surgeon: Paralee Cancel, MD;  Location: [facility];  Service: Orthopedics;  Laterality: Left;      Current Facility-Administered Medications  Medication Dose  Route Frequency Provider Last Rate Last Dose  . 0.9 %  sodium chloride infusion   Intravenous Once Tanna Furry, MD      . ALPRAZolam Duanne Moron) tablet 0.25 mg  0.25 mg Oral QID PRN Theodis Blaze, MD      . azithromycin (ZITHROMAX) 500 mg in dextrose 5 % 250 mL IVPB  500 mg Intravenous Q24H Theodis Blaze, MD      . cefTRIAXone (ROCEPHIN) 1 g in dextrose 5 % 50 mL IVPB  1 g Intravenous Q24H Theodis Blaze, MD      . enoxaparin (LOVENOX) injection 40 mg  40 mg Subcutaneous Q24H Theodis Blaze, MD         No Known Allergies  REVIEW OF SYSTEMS: Skin:  No history of rash.  No history of abnormal moles. Infection:  No history of hepatitis or HIV.  No history of MRSA. Neurologic:  No history of stroke.  No history of seizure.  No history of headaches. Cardiac:  HTN  x 30 years.  No history of seeing a cardiologist. Pulmonary:  Does not smoke cigarettes.  No asthma or bronchitis.  No OSA/CPAP.  Endocrine:  No diabetes. No thyroid disease. Gastrointestinal:  See HPI Urologic:  Remote history of kidney stones. Musculoskeletal:  Left total knee - 11/13/2014 Alvan Dame.  She also had the other knee replaced. Hematologic:  No bleeding disorder.  No history of anemia.  Not anticoagulated. Psycho-social:  The patient is oriented.   The patient has no obvious psychologic or social impairment to understanding our conversation and plan.  SOCIAL and FAMILY HISTORY: Married, but her husband has Alzheimer's disease and is in an assisted living facility [facility]). Her daughter, [name] [name], and her niece, [name] [name], are in the room. She also has a son who lives in [place].  PHYSICAL EXAM: BP 143/57 mmHg  Pulse 85  Temp(Src) 99.1 F (37.3 C) (Oral)  Resp 20  Wt 70.308 kg (155 lb)  SpO2 98%  General: AA F who is alert.  She has a pleasant personality. HEENT: Normal. Pupils equal. Neck: Supple. No mass.  No thyroid mass. Lymph Nodes:  No supraclavicular or cervical nodes. Lungs: Clear to auscultation and symmetric breath sounds. Heart:  RRR. No murmur or rub. Abdomen: Soft. No mass.She has a midline scar and a smaller left abdomen scar and a right abdomen scar.  She is slightly full.  She has bowel sounds.  Extremities:  Right middle finger wrapped from laceration she sustained cutting cabbage. Neurologic:  Grossly intact to motor and sensory function. Psychiatric: Has normal mood and affect. Behavior is normal.   DATA REVIEWED: Epic notes  Alphonsa Overall, MD,  [facility] [address]  [place], [place]    [place] Phone:  [place]:  [phone]

## 2015-03-25 ENCOUNTER — Inpatient Hospital Stay (HOSPITAL_COMMUNITY): Payer: Medicare Other

## 2015-03-25 LAB — CBC
HCT: 31.2 % — ABNORMAL LOW (ref 36.0–46.0)
HEMOGLOBIN: 9.8 g/dL — AB (ref 12.0–15.0)
MCH: 26.2 pg (ref 26.0–34.0)
MCHC: 31.4 g/dL (ref 30.0–36.0)
MCV: 83.4 fL (ref 78.0–100.0)
PLATELETS: 224 10*3/uL (ref 150–400)
RBC: 3.74 MIL/uL — AB (ref 3.87–5.11)
RDW: 17.4 % — ABNORMAL HIGH (ref 11.5–15.5)
WBC: 5.9 10*3/uL (ref 4.0–10.5)

## 2015-03-25 LAB — BASIC METABOLIC PANEL
ANION GAP: 7 (ref 5–15)
BUN: 10 mg/dL (ref 6–20)
CALCIUM: 8.5 mg/dL — AB (ref 8.9–10.3)
CO2: 23 mmol/L (ref 22–32)
Chloride: 109 mmol/L (ref 101–111)
Creatinine, Ser: 0.77 mg/dL (ref 0.44–1.00)
Glucose, Bld: 92 mg/dL (ref 65–99)
Potassium: 4 mmol/L (ref 3.5–5.1)
SODIUM: 139 mmol/L (ref 135–145)

## 2015-03-25 LAB — STREP PNEUMONIAE URINARY ANTIGEN: Strep Pneumo Urinary Antigen: POSITIVE — AB

## 2015-03-25 MED ORDER — BOOST / RESOURCE BREEZE PO LIQD
1.0000 | Freq: Three times a day (TID) | ORAL | Status: DC
  Administered 2015-03-25 (×2): 1 via ORAL

## 2015-03-25 NOTE — Progress Notes (Signed)
Subjective: She feels better nothing coming from the NG, what is in the cannister looks like ice chips.  No flatus or BM so far.  This is her first obstruction since her surgery some years ago.    Objective: Vital signs in last 24 hours: Temp:  [97.7 F (36.5 C)-99.1 F (37.3 C)] 97.8 F (36.6 C) (03/06 0552) Pulse Rate:  [77-85] 82 (03/06 0552) Resp:  [18-20] 20 (03/06 0552) BP: (135-177)/(57-85) 177/85 mmHg (03/06 0552) SpO2:  [97 %-100 %] 100 % (03/06 0552) Last BM Date: 03/24/15 200 from the NG 1150 urine Afebrile, BP up some Labs OK Film this AM shows :  Resolved small bowel obstruction. Oral contrast present now throughout the large bowel.  No free air.  Increased streaky lung base opacity, favor atelectasis. Intake/Output from previous day: 03/05 0701 - 03/06 0700 In: 1200 [I.V.:1200] Out: 1350 [Urine:1150; Emesis/NG output:200] Intake/Output this shift: Total I/O In: 460 [P.O.:60; I.V.:400] Out: 550 [Urine:400; Emesis/NG output:150]  General appearance: alert, cooperative and no distress GI: soft, non-tender; bowel sounds normal; no masses,  no organomegaly  Lab Results:   Recent Labs  03/24/15 0850 03/25/15 0424  WBC 9.4 5.9  HGB 13.2 9.8*  HCT 41.7 31.2*  PLT 286 224    BMET  Recent Labs  03/24/15 0850 03/25/15 0424  NA 139 139  K 3.9 4.0  CL 97* 109  CO2 29 23  GLUCOSE 130* 92  BUN 13 10  CREATININE 0.97 0.77  CALCIUM 10.3 8.5*   PT/INR No results for input(s): LABPROT, INR in the last 72 hours.   Recent Labs Lab 03/24/15 0850  AST 27  ALT 19  ALKPHOS 68  BILITOT 0.4  PROT 8.1  ALBUMIN 4.3     Lipase     Component Value Date/Time   LIPASE 39 03/24/2015 0850     Studies/Results: Ct Abdomen Pelvis W Contrast  03/24/2015  CLINICAL DATA:  New onset of right lower quadrant pain. Hematemesis. Symptoms began last night. EXAM: CT ABDOMEN AND PELVIS WITH CONTRAST TECHNIQUE: Multidetector CT imaging of the abdomen and pelvis was  performed using the standard protocol following bolus administration of intravenous contrast. CONTRAST:  73mL OMNIPAQUE IOHEXOL 300 MG/ML SOLN, 136mL OMNIPAQUE IOHEXOL 300 MG/ML SOLN COMPARISON:  Ultrasound 09/03/2009 FINDINGS: There is infiltrate and volume loss in the right lower lobe consistent with pneumonia. The liver is normal except for a sub cm cyst at the dome. No calcified gallstones. The common duct is slightly prominent, but no obstructing lesion is seen. The spleen is normal. The pancreas is normal. The adrenal glands are normal. The kidneys are normal. There is atherosclerosis of the aorta but no aneurysm. The IVC is normal. No retroperitoneal mass or lymphadenopathy. The small bowel is dilated to the level of the mid ileum. The distal ileum does not appear dilated. There are surgical clips at the region of the cecum consistent with previous appendectomy. I do not identify a specific obstructing lesion. Uterus and adnexal regions are unremarkable. There chronic degenerative changes of the lower lumbar spine. IMPRESSION: Small bowel obstruction, probably in the mid ileal region. Exact etiology not determined. Surgical clips at the tip of the cecum consistent with previous appendectomy. Abnormal density in the right lower lobe consistent with pneumonia. Electronically Signed   By: Nelson Chimes M.D.   On: 03/24/2015 10:37   Dg Abd 2 Views  03/25/2015  CLINICAL DATA:  82 year old female with small bowel obstruction on body CT. Abdominal pain and hematemesis. Initial  encounter. EXAM: ABDOMEN - 2 VIEW COMPARISON:  CT Abdomen and Pelvis 03/24/2015. FINDINGS: Right lower quadrant staple line re- demonstrated. The CT oral contrast has reached the colon, including distally at the level of the rectum. Occasional colonic diverticula. There is excreted IV contrast in the urinary bladder. No dilated or opacified small bowel loops. No pneumoperitoneum. Abdominal and pelvic visceral contours are stable and within  normal limits. No acute osseous abnormality identified. Increased streaky opacity at the left lung base, favor atelectasis. IMPRESSION: 1. Resolved small bowel obstruction. Oral contrast present now throughout the large bowel. 2. No free air. 3. Increased streaky lung base opacity, favor atelectasis. Electronically Signed   By: Genevie Ann M.D.   On: 03/25/2015 09:00    Medications: . sodium chloride   Intravenous Once  . antiseptic oral rinse  7 mL Mouth Rinse q12n4p  . azithromycin  500 mg Intravenous Q24H  . cefTRIAXone (ROCEPHIN)  IV  1 g Intravenous Q24H  . chlorhexidine  15 mL Mouth Rinse BID  . enoxaparin (LOVENOX) injection  40 mg Subcutaneous Q24H    Prior to Admission medications   Medication Sig Start Date End Date Taking? Authorizing Provider  ALPRAZolam (XANAX) 0.25 MG tablet Take 1 tablet (0.25 mg total) by mouth 4 (four) times daily as needed. ANXIETY/ SPASM Patient taking differently: Take 0.25 mg by mouth 4 (four) times daily as needed for anxiety.  07/15/12  Yes Matthew Babish, PA-C  amLODipine (NORVASC) 5 MG tablet Take 5 mg by mouth daily. 03/21/15  Yes Historical Provider, MD  fish oil-omega-3 fatty acids 1000 MG capsule Take 1 g by mouth daily.    Yes Historical Provider, MD  loratadine (CLARITIN) 10 MG tablet Take 10 mg by mouth daily as needed for allergies.    Yes Historical Provider, MD  OVER THE COUNTER MEDICATION Take 1 tablet by mouth 3 (three) times daily as needed (pain). Curamin   Yes Historical Provider, MD  ramipril (ALTACE) 10 MG tablet Take 10 mg by mouth 2 (two) times daily.     Yes Historical Provider, MD  simethicone (MYLICON) 80 MG chewable tablet Chew 80 mg by mouth every 6 (six) hours as needed for flatulence.   Yes Historical Provider, MD  traMADol (ULTRAM) 50 MG tablet Take 50-100 mg by mouth See admin instructions.  Every 6-8 hours as needed for pain 03/09/15  Yes Historical Provider, MD  Vitamin D, Ergocalciferol, (DRISDOL) 50000 UNITS CAPS Take 50,000  Units by mouth every 7 (seven) days. Friday   Yes Historical Provider, MD  VOLTAREN 1 % GEL Apply 2 g topically 3 (three) times daily. To right knee as needed for pain 12/26/14  Yes Historical Provider, MD  docusate sodium (COLACE) 100 MG capsule Take 1 capsule (100 mg total) by mouth 2 (two) times daily. Patient not taking: Reported on 03/24/2015 11/15/14   Danae Orleans, PA-C  ferrous sulfate 325 (65 FE) MG tablet Take 1 tablet (325 mg total) by mouth 3 (three) times daily after meals. Patient not taking: Reported on 03/24/2015 11/15/14   Danae Orleans, PA-C  HYDROcodone-acetaminophen (NORCO) 7.5-325 MG tablet Take 1-2 tablets by mouth every 4 (four) hours as needed for moderate pain. Patient not taking: Reported on 03/24/2015 11/15/14   Danae Orleans, PA-C  polyethylene glycol (MIRALAX / Floria Raveling) packet Take 17 g by mouth 2 (two) times daily. Patient not taking: Reported on 03/24/2015 11/15/14   Danae Orleans, PA-C  tiZANidine (ZANAFLEX) 4 MG tablet Take 1 tablet (4 mg total) by  mouth every 6 (six) hours as needed for muscle spasms. Patient not taking: Reported on 03/24/2015 11/15/14   Danae Orleans, PA-C    Assessment/Plan SBO Hx of colon resection and colostomy reversal 30 years ago Hypertension Anxiety lacerataion R middle finger 03/22/15  Plan:  Clamp NG, clears, mobilize.  Hopefully pull NG later today.    Plan   LOS: 1 day    Xandria Gallaga 03/25/2015

## 2015-03-25 NOTE — Progress Notes (Signed)
[facility] PROGRESS NOTE  [name] [name] [id number] DOB: 1928-05-03 DOA: 03/24/2015 PCP: [name], MD   HPI/Subjective: 82 year old female with a history of colon resection with colostomy reversal (~30 years ago) presented with worsening epigastric pain, nausea, vomiting which began 03/22/15. She also has not had a BM or passed gas since 03/23/15. CT abdomen on 03/24/15 showed mid ileal SBO and RLL pneumonia. Pt started on antibiotics, NG tube in place, surgery consulted.  Subjective: Repeat CT abdomen this AM showed SBO has resolved. NG tube still in place, but clamped, started on clear liquid diet. Once she has a bowel movement, will remove tube and advance diet as tolerated. Strep pneumo urine antigen positive - pt being treated with antibiotics for RLL pneumonia. Blood and sputum cultures pending.  Assessment/Plan: Small bowel obstruction: Presented with abdominal pain, nausea, vomiting, CT abdomen on 3/5 showed mid ileal SBO, general surgery on consult. Repeat CT this AM showed that this has now resolved. Pt's abdominal pain has improved - NG tube in place at this time, start on clear liquid diet. Will remove NG tube and advance diet as tolerated once pt has bowel movement. Encourage ambulation. Continue IVF.  RLL Pneumonia: Likely due to aspiration as pt had been vomiting, she is asymptomatic at this time, O2 sat 100% on RA - started on azithromycin and ceftriaxone on 3/5, complete 7 days. Strep pneumo urine antigen positive. Blood and sputum cultures pending.  Anemia: History of B12 deficiency, Hgb of 9.8 this AM - may also be dilutional, pt hemodynamically stable at this time. Recheck CBC.  Essential HTN: Continue home meds.  Anxiety: Continue home meds, Xanax PRN while inpatient  Code Status: Full code Family Communication: None at bedside Disposition Plan: Remain inpatient   Consultants:  General surgery  Procedures:  None  Antibiotics:  Azithromycin &  Ceftriaxone started 03/24/15   Objective: Filed Vitals:   03/24/15 2200 03/25/15 0552  BP: 135/71 177/85  Pulse: 77 82  Temp: 97.7 F (36.5 C) 97.8 F (36.6 C)  Resp: 18 20    Intake/Output Summary (Last 24 hours) at 03/25/15 1032 Last data filed at 03/25/15 1019  Gross per 24 hour  Intake   1260 ml  Output   1500 ml  Net   -240 ml   Filed Weights   03/24/15 0810  Weight: 70.308 kg (155 lb)    Exam:   General:  Alert and oriented, in no acute distress  Cardiovascular: RRR  Respiratory: CTAB  Abdomen: Tenderness has improved  Data Reviewed: Basic Metabolic Panel:  Recent Labs Lab 03/24/15 0850 03/25/15 0424  NA 139 139  K 3.9 4.0  CL 97* 109  CO2 29 23  GLUCOSE 130* 92  BUN 13 10  CREATININE 0.97 0.77  CALCIUM 10.3 8.5*   Liver Function Tests:  Recent Labs Lab 03/24/15 0850  AST 27  ALT 19  ALKPHOS 68  BILITOT 0.4  PROT 8.1  ALBUMIN 4.3    Recent Labs Lab 03/24/15 0850  LIPASE 39   No results for input(s): AMMONIA in the last 168 hours. CBC:  Recent Labs Lab 03/24/15 0850 03/25/15 0424  WBC 9.4 5.9  HGB 13.2 9.8*  HCT 41.7 31.2*  MCV 81.1 83.4  PLT 286 224   Cardiac Enzymes: No results for input(s): CKTOTAL, CKMB, CKMBINDEX, TROPONINI in the last 168 hours. BNP (last 3 results) No results for input(s): BNP in the last 8760 hours.  ProBNP (last 3 results) No results for input(s): PROBNP in  the last 8760 hours.  CBG: No results for input(s): GLUCAP in the last 168 hours.  No results found for this or any previous visit (from the past 240 hour(s)).   Studies: Ct Abdomen Pelvis W Contrast  03/24/2015  CLINICAL DATA:  New onset of right lower quadrant pain. Hematemesis. Symptoms began last night. EXAM: CT ABDOMEN AND PELVIS WITH CONTRAST TECHNIQUE: Multidetector CT imaging of the abdomen and pelvis was performed using the standard protocol following bolus administration of intravenous contrast. CONTRAST:  51mL OMNIPAQUE IOHEXOL  300 MG/ML SOLN, 169mL OMNIPAQUE IOHEXOL 300 MG/ML SOLN COMPARISON:  Ultrasound 09/03/2009 FINDINGS: There is infiltrate and volume loss in the right lower lobe consistent with pneumonia. The liver is normal except for a sub cm cyst at the dome. No calcified gallstones. The common duct is slightly prominent, but no obstructing lesion is seen. The spleen is normal. The pancreas is normal. The adrenal glands are normal. The kidneys are normal. There is atherosclerosis of the aorta but no aneurysm. The IVC is normal. No retroperitoneal mass or lymphadenopathy. The small bowel is dilated to the level of the mid ileum. The distal ileum does not appear dilated. There are surgical clips at the region of the cecum consistent with previous appendectomy. I do not identify a specific obstructing lesion. Uterus and adnexal regions are unremarkable. There chronic degenerative changes of the lower lumbar spine. IMPRESSION: Small bowel obstruction, probably in the mid ileal region. Exact etiology not determined. Surgical clips at the tip of the cecum consistent with previous appendectomy. Abnormal density in the right lower lobe consistent with pneumonia. Electronically Signed   By: Nelson Chimes M.D.   On: 03/24/2015 10:37   Dg Abd 2 Views  03/25/2015  CLINICAL DATA:  82 year old female with small bowel obstruction on body CT. Abdominal pain and hematemesis. Initial encounter. EXAM: ABDOMEN - 2 VIEW COMPARISON:  CT Abdomen and Pelvis 03/24/2015. FINDINGS: Right lower quadrant staple line re- demonstrated. The CT oral contrast has reached the colon, including distally at the level of the rectum. Occasional colonic diverticula. There is excreted IV contrast in the urinary bladder. No dilated or opacified small bowel loops. No pneumoperitoneum. Abdominal and pelvic visceral contours are stable and within normal limits. No acute osseous abnormality identified. Increased streaky opacity at the left lung base, favor atelectasis.  IMPRESSION: 1. Resolved small bowel obstruction. Oral contrast present now throughout the large bowel. 2. No free air. 3. Increased streaky lung base opacity, favor atelectasis. Electronically Signed   By: Genevie Ann M.D.   On: 03/25/2015 09:00    Scheduled Meds: . sodium chloride   Intravenous Once  . antiseptic oral rinse  7 mL Mouth Rinse q12n4p  . azithromycin  500 mg Intravenous Q24H  . cefTRIAXone (ROCEPHIN)  IV  1 g Intravenous Q24H  . chlorhexidine  15 mL Mouth Rinse BID  . enoxaparin (LOVENOX) injection  40 mg Subcutaneous Q24H   Continuous Infusions:   Active Problems:   SBO (small bowel obstruction) ([place])    Time spent: [place], PA-S [facility] Pager [phone] If 7PM-7AM, please contact night-coverage at [email or web address], password [facility] 03/25/2015, 10:32 AM  LOS: 1 day     Birdie Hopes Pager: [phone] 03/25/2015, 1:10 PM

## 2015-03-25 NOTE — Progress Notes (Signed)
Initial Nutrition Assessment  INTERVENTION:   Provide Boost Breeze po TID, each supplement provides 250 kcal and 9 grams of protein RD to continue to monitor for further diet advancement  NUTRITION DIAGNOSIS:   Inadequate oral intake related to inability to eat as evidenced by NPO status (/clear liquid diet).  GOAL:   Patient will meet greater than or equal to 90% of their needs  MONITOR:   PO intake, Supplement acceptance, Labs, Weight trends, I & O's  REASON FOR ASSESSMENT:   Malnutrition Screening Tool    ASSESSMENT:   82 yo female who presents to [facility] ED with main concern of several days duration of progressively worsening epigastric pain, intermittent in nature and throbbing, occasionally sharp, radiating to lower abd quadrants, 7/10 in severity when present, associated with nausea and non bloody vomiting, poor oral intake, malaise.  Patient in room with no family at bedside. Pt with NGT, 150 ml of output today. Per surgery, may take NGT out. Pt states she is unsure if it has helped her nausea. Diet has been advanced to clear liquids. RD to order [facility] while patient is on liquid diet. Pt denies any swallowing or chewing problems. Pt states she was eating well and normally with no appetite changes until 3/3 or 3/4 when she started having abdominal pain and nausea. Pt states her UBW is 170 lb but has not weighed this since 2015. Pt with insignificant weight loss more recently.  Nutrition-Focused physical exam completed. Findings are no fat depletion, mild-mdoerate muscle depletion, and no edema.   Labs and medications reviewed.    Diet Order:  Diet clear liquid Room service appropriate?: Yes; Fluid consistency:: Thin  Skin:  Reviewed, no issues  Last BM:  3/5  Height:   Ht Readings from Last 1 Encounters:  11/13/14 5\' 4"  (1.626 m)    Weight:   Wt Readings from Last 1 Encounters:  03/24/15 155 lb (70.308 kg)    Ideal Body Weight:  54.5 kg  BMI:  Body mass  index is 26.59 kg/(m^2).  Estimated Nutritional Needs:   Kcal:  1700-1900  Protein:  75-85g  Fluid:  1.9L/day  EDUCATION NEEDS:   No education needs identified at this time  Clayton Bibles, MS, RD, LDN Pager: [phone] After Hours Pager: [phone]

## 2015-03-26 DIAGNOSIS — J189 Pneumonia, unspecified organism: Secondary | ICD-10-CM | POA: Diagnosis present

## 2015-03-26 DIAGNOSIS — J181 Lobar pneumonia, unspecified organism: Secondary | ICD-10-CM

## 2015-03-26 LAB — RETICULOCYTES
RBC.: 3.51 MIL/uL — AB (ref 3.87–5.11)
RETIC COUNT ABSOLUTE: 38.6 10*3/uL (ref 19.0–186.0)
Retic Ct Pct: 1.1 % (ref 0.4–3.1)

## 2015-03-26 LAB — LEGIONELLA ANTIGEN, URINE

## 2015-03-26 LAB — IRON AND TIBC
IRON: 38 ug/dL (ref 28–170)
Saturation Ratios: 15 % (ref 10.4–31.8)
TIBC: 259 ug/dL (ref 250–450)
UIBC: 221 ug/dL

## 2015-03-26 LAB — FERRITIN: FERRITIN: 107 ng/mL (ref 11–307)

## 2015-03-26 LAB — VITAMIN B12: Vitamin B-12: 367 pg/mL (ref 180–914)

## 2015-03-26 LAB — FOLATE: Folate: 22.1 ng/mL (ref >5.9)

## 2015-03-26 NOTE — Progress Notes (Signed)
  Subjective: No complaints, + BM tolerating clears  Objective: Vital signs in last 24 hours: Temp:  [97.6 F (36.4 C)-98.6 F (37 C)] 98 F (36.7 C) (03/07 0508) Pulse Rate:  [74-87] 74 (03/07 0508) Resp:  [16-18] 16 (03/07 0508) BP: (136-168)/(52-69) 157/69 mmHg (03/07 0508) SpO2:  [98 %-99 %] 99 % (03/07 0508) Weight:  [70.308 kg (155 lb)] 70.308 kg (155 lb) (03/07 1038) Last BM Date: 03/24/15 417 yesterday and 240 this AM BM x 1 this AM Afebrile, VSS Labs OK Intake/Output from previous day: 04-15-22 0701 - 03/07 0700 In: 817 [P.O.:417; I.V.:400] Out: 1400 [Urine:1200; Emesis/NG output:200] Intake/Output this shift: Total I/O In: 640 [P.O.:240; I.V.:400] Out: -   General appearance: alert, cooperative and no distress GI: soft, non-tender; bowel sounds normal; no masses,  no organomegaly  Lab Results:   Recent Labs  03/24/15 0850 04-15-2015 0424  WBC 9.4 5.9  HGB 13.2 9.8*  HCT 41.7 31.2*  PLT 286 224    BMET  Recent Labs  03/24/15 0850 04-15-15 0424  NA 139 139  K 3.9 4.0  CL 97* 109  CO2 29 23  GLUCOSE 130* 92  BUN 13 10  CREATININE 0.97 0.77  CALCIUM 10.3 8.5*   PT/INR No results for input(s): LABPROT, INR in the last 72 hours.   Recent Labs Lab 03/24/15 0850  AST 27  ALT 19  ALKPHOS 68  BILITOT 0.4  PROT 8.1  ALBUMIN 4.3     Lipase     Component Value Date/Time   LIPASE 39 03/24/2015 0850     Studies/Results: Dg Abd 2 Views  04/15/15  CLINICAL DATA:  82 year old female with small bowel obstruction on body CT. Abdominal pain and hematemesis. Initial encounter. EXAM: ABDOMEN - 2 VIEW COMPARISON:  CT Abdomen and Pelvis 03/24/2015. FINDINGS: Right lower quadrant staple line re- demonstrated. The CT oral contrast has reached the colon, including distally at the level of the rectum. Occasional colonic diverticula. There is excreted IV contrast in the urinary bladder. No dilated or opacified small bowel loops. No pneumoperitoneum.  Abdominal and pelvic visceral contours are stable and within normal limits. No acute osseous abnormality identified. Increased streaky opacity at the left lung base, favor atelectasis. IMPRESSION: 1. Resolved small bowel obstruction. Oral contrast present now throughout the large bowel. 2. No free air. 3. Increased streaky lung base opacity, favor atelectasis. Electronically Signed   By: Genevie Ann M.D.   On: 2015-04-15 09:00    Medications: . sodium chloride   Intravenous Once  . antiseptic oral rinse  7 mL Mouth Rinse q12n4p  . azithromycin  500 mg Intravenous Q24H  . cefTRIAXone (ROCEPHIN)  IV  1 g Intravenous Q24H  . chlorhexidine  15 mL Mouth Rinse BID  . enoxaparin (LOVENOX) injection  40 mg Subcutaneous Q24H  . feeding supplement  1 Container Oral TID BM    Assessment/Plan SBO RLL pneumonia  Hx of colon resection and colostomy reversal 30 years ago Hypertension Anxiety lacerataion R middle finger 03/22/15 Antibiotics:  Day 3 Rocephin DVT: Lovenox  Plan:  Full liquids today and soft in AM.  Home when her diet is being tolerated and Pneumonia resolved.    LOS: 2 days    [name] [name] 03/26/2015

## 2015-03-26 NOTE — Progress Notes (Signed)
[facility] PROGRESS NOTE  [name] [name] [id number] DOB: Nov 11, 1928 DOA: 03/24/2015 PCP: [name], MD   HPI/Subjective: 82 year old female with a history of colon resection with colostomy reversal (~30 years ago) presented with worsening epigastric pain, nausea, vomiting which began 03/22/15. She also has not had a BM or passed gas since 03/23/15. CT abdomen on 03/24/15 showed mid ileal SBO and RLL pneumonia. Pt started on antibiotics, NG tube in place, surgery consulted.  Subjective: Multiple bowel movements since yesterday, she had 2 of them today. On full liquids, likely can advance to regular diet and discharge later today if it's okay with general surgery.  Assessment/Plan:  Small bowel obstruction:  Presented with abdominal pain, nausea, vomiting, CT abdomen on 3/5 showed mid ileal SBO. General surgery on consult. NG tube placed for low intermittent suction, patient had multiple bowel movements. NG tube removed on 03/25/15, tolerated liquids okay. ADAT.  RLL Pneumonia:  Likely due to aspiration as pt had been vomiting, she is asymptomatic at this time, O2 sat 100% on RA. On Rocephin and azithromycin, continued. Urine is positive for Streptococcus antigen.  Anemia: History of B12 deficiency, Hgb of 9.8 this AM - may also be dilutional, pt hemodynamically stable at this time. Recheck CBC.  Essential HTN: Continue home meds.  Anxiety: Continue home meds, Xanax PRN while inpatient  Code Status: Full code Family Communication: None at bedside Disposition Plan: Remain inpatient   Consultants:  General surgery  Procedures:  None  Antibiotics:  Azithromycin & Ceftriaxone started 03/24/15   Objective: Filed Vitals:   03/25/15 2123 03/26/15 0508  BP: 136/52 157/69  Pulse: 79 74  Temp: 98.6 F (37 C) 98 F (36.7 C)  Resp: 16 16    Intake/Output Summary (Last 24 hours) at 03/26/15 1138 Last data filed at 03/26/15 1000  Gross per 24 hour  Intake    997 ml   Output    850 ml  Net    147 ml   Filed Weights   03/24/15 0810 03/26/15 1038  Weight: 70.308 kg (155 lb) 70.308 kg (155 lb)    Exam:   General:  Alert and oriented, in no acute distress  Cardiovascular: RRR  Respiratory: CTAB  Abdomen: Tenderness has improved  Data Reviewed: Basic Metabolic Panel:  Recent Labs Lab 03/24/15 0850 03/25/15 0424  NA 139 139  K 3.9 4.0  CL 97* 109  CO2 29 23  GLUCOSE 130* 92  BUN 13 10  CREATININE 0.97 0.77  CALCIUM 10.3 8.5*   Liver Function Tests:  Recent Labs Lab 03/24/15 0850  AST 27  ALT 19  ALKPHOS 68  BILITOT 0.4  PROT 8.1  ALBUMIN 4.3    Recent Labs Lab 03/24/15 0850  LIPASE 39   No results for input(s): AMMONIA in the last 168 hours. CBC:  Recent Labs Lab 03/24/15 0850 03/25/15 0424  WBC 9.4 5.9  HGB 13.2 9.8*  HCT 41.7 31.2*  MCV 81.1 83.4  PLT 286 224   Cardiac Enzymes: No results for input(s): CKTOTAL, CKMB, CKMBINDEX, TROPONINI in the last 168 hours. BNP (last 3 results) No results for input(s): BNP in the last 8760 hours.  ProBNP (last 3 results) No results for input(s): PROBNP in the last 8760 hours.  CBG: No results for input(s): GLUCAP in the last 168 hours.  Recent Results (from the past 240 hour(s))  Culture, blood (routine x 2) Call MD if unable to obtain prior to antibiotics being given     Status: None (  Preliminary result)   Collection Time: 03/24/15  1:55 PM  Result Value Ref Range Status   Specimen Description BLOOD RIGHT HAND  Final   Special Requests   Final    BOTTLES DRAWN AEROBIC AND ANAEROBIC 10CC BOTH BOTTLES   Culture   Final    NO GROWTH < 24 HOURS Performed at [facility]    Report Status PENDING  Incomplete  Culture, blood (routine x 2) Call MD if unable to obtain prior to antibiotics being given     Status: None (Preliminary result)   Collection Time: 03/24/15  2:00 PM  Result Value Ref Range Status   Specimen Description BLOOD RIGHT HAND  Final    Special Requests   Final    BOTTLES DRAWN AEROBIC AND ANAEROBIC 10CC BOTH BOTTLES   Culture   Final    NO GROWTH < 24 HOURS Performed at [facility]    Report Status PENDING  Incomplete     Studies: Dg Abd 2 Views  03/25/2015  CLINICAL DATA:  82 year old female with small bowel obstruction on body CT. Abdominal pain and hematemesis. Initial encounter. EXAM: ABDOMEN - 2 VIEW COMPARISON:  CT Abdomen and Pelvis 03/24/2015. FINDINGS: Right lower quadrant staple line re- demonstrated. The CT oral contrast has reached the colon, including distally at the level of the rectum. Occasional colonic diverticula. There is excreted IV contrast in the urinary bladder. No dilated or opacified small bowel loops. No pneumoperitoneum. Abdominal and pelvic visceral contours are stable and within normal limits. No acute osseous abnormality identified. Increased streaky opacity at the left lung base, favor atelectasis. IMPRESSION: 1. Resolved small bowel obstruction. Oral contrast present now throughout the large bowel. 2. No free air. 3. Increased streaky lung base opacity, favor atelectasis. Electronically Signed   By: Genevie Ann M.D.   On: 03/25/2015 09:00    Scheduled Meds: . sodium chloride   Intravenous Once  . antiseptic oral rinse  7 mL Mouth Rinse q12n4p  . azithromycin  500 mg Intravenous Q24H  . cefTRIAXone (ROCEPHIN)  IV  1 g Intravenous Q24H  . chlorhexidine  15 mL Mouth Rinse BID  . enoxaparin (LOVENOX) injection  40 mg Subcutaneous Q24H  . feeding supplement  1 Container Oral TID BM   Continuous Infusions:   Active Problems:   SBO (small bowel obstruction) ([place])    Time spent: 25 minutes    [facility] Pager [phone] If 7PM-7AM, please contact night-coverage at [email or web address], password [email or web address] 03/26/2015, 11:38 AM  LOS: 2 days

## 2015-03-27 DIAGNOSIS — K5669 Other intestinal obstruction: Secondary | ICD-10-CM

## 2015-03-27 DIAGNOSIS — J690 Pneumonitis due to inhalation of food and vomit: Secondary | ICD-10-CM | POA: Insufficient documentation

## 2015-03-27 DIAGNOSIS — K56609 Unspecified intestinal obstruction, unspecified as to partial versus complete obstruction: Secondary | ICD-10-CM | POA: Insufficient documentation

## 2015-03-27 DIAGNOSIS — I10 Essential (primary) hypertension: Secondary | ICD-10-CM

## 2015-03-27 DIAGNOSIS — J189 Pneumonia, unspecified organism: Secondary | ICD-10-CM

## 2015-03-27 MED ORDER — LEVOFLOXACIN 500 MG PO TABS: 500.0000 mg | ORAL_TABLET | Freq: Every day | ORAL | Status: DC

## 2015-03-27 NOTE — Discharge Summary (Signed)
Physician Discharge Summary  [name] [name] [id number] DOB: 05-05-1928 DOA: 03/24/2015  PCP: [name], MD  Admit date: 03/24/2015 Discharge date: 03/27/2015  Time spent: 45 minutes  Recommendations for Outpatient Follow-up:  1. Dr.Russo in 1 week, please repeat CBC in 1-2weeks, consider iron therapy at FU   Discharge Diagnoses:     SBO (small bowel obstruction) ([place])   RLL pneumonia   Aspiration pneumonia of right lower lobe ([facility])   Small bowel obstruction ([place])   Essential hypertension  Discharge Condition: stable  Diet recommendation: soft, low sodium  Filed Weights   03/24/15 0810 03/26/15 1038  Weight: 70.308 kg (155 lb) 70.308 kg (155 lb)    History of present illness:  Chief Complaint: abd pain, nausea and vomiting  HPI:  Pt is 82 yo female who presents to [facility] ED with main concern of several days duration of progressively worsening epigastric pain, intermittent in nature and throbbing, occasionally sharp, radiating to lower abd quadrants, 7/10 in severity when present, associated with nausea and non bloody vomiting, poor oral intake, malaise. In ED, CT abd concerning for SBO, RLL PNA  Hospital Course:   Small bowel obstruction:  Presented with abdominal pain, nausea, vomiting, CT abdomen on 3/5 showed mid ileal SBO. General surgery consulted, treated with bowel rest, IVF, NG decompression, improved now, having Bms and tolerating regular diet at the time of discharge  RLL Pneumonia:  Likely due to aspiration as pt had been vomiting prior to admission -improved, treated with Rocephin and azithromycin initially, Urine positive for Streptococcus antigen. -changed to Po levaquin at discharge for few more days  Anemia:  -History of B12 deficiency, felt to be hemodilutional -no overt bleeding, anemia panel with mild Iron defi, consider FU CBC and starting Iron therapy in about 1 week after GI symptoms completely resolved  Essential HTN: Continue home  meds.  Anxiety: Continue home meds, Xanax PRN   Consultations:  CC[facility]  Discharge Exam: Filed Vitals:   03/26/15 2131 03/27/15 0511  BP: 153/80 152/66  Pulse: 72 69  Temp: 98.6 F (37 C) 98.5 F (36.9 C)  Resp: 16 14    General: AAOx3 Cardiovascular: S1S2/RRR Respiratory: CTAB  Discharge Instructions   Discharge Instructions    Discharge instructions    Complete by:  As directed   Soft diet advance to regular diet as tolerated     Increase activity slowly    Complete by:  As directed           Current Discharge Medication List    START taking these medications   Details  levofloxacin (LEVAQUIN) 500 MG tablet Take 1 tablet (500 mg total) by mouth daily. For 3days Qty: 3 tablet, Refills: 0      CONTINUE these medications which have NOT CHANGED   Details  ALPRAZolam (XANAX) 0.25 MG tablet Take 1 tablet (0.25 mg total) by mouth 4 (four) times daily as needed. ANXIETY/ SPASM Qty: 30 tablet, Refills: 0    amLODipine (NORVASC) 5 MG tablet Take 5 mg by mouth daily.    fish oil-omega-3 fatty acids 1000 MG capsule Take 1 g by mouth daily.     loratadine (CLARITIN) 10 MG tablet Take 10 mg by mouth daily as needed for allergies.     OVER THE COUNTER MEDICATION Take 1 tablet by mouth 3 (three) times daily as needed (pain). Curamin    ramipril (ALTACE) 10 MG tablet Take 10 mg by mouth 2 (two) times daily.      traMADol Veatrice Bourbon)  50 MG tablet Take 50-100 mg by mouth See admin instructions.  Every 6-8 hours as needed for pain Refills: 0    Vitamin D, Ergocalciferol, (DRISDOL) 50000 UNITS CAPS Take 50,000 Units by mouth every 7 (seven) days. Friday    VOLTAREN 1 % GEL Apply 2 g topically 3 (three) times daily. To right knee as needed for pain Refills: 1      STOP taking these medications     simethicone (MYLICON) 80 MG chewable tablet      docusate sodium (COLACE) 100 MG capsule      ferrous sulfate 325 (65 FE) MG tablet      HYDROcodone-acetaminophen (NORCO)  7.5-325 MG tablet      polyethylene glycol (MIRALAX / GLYCOLAX) packet      tiZANidine (ZANAFLEX) 4 MG tablet        No Known Allergies Follow-up Information    Follow up with [name], MD. Schedule an appointment as soon as possible for a visit in 1 week.   Specialty:  Internal Medicine   Contact information:   [place]  [id number] [phone]        The results of significant diagnostics from this hospitalization (including imaging, microbiology, ancillary and laboratory) are listed below for reference.    Significant Diagnostic Studies: Ct Abdomen Pelvis W Contrast  03/24/2015  CLINICAL DATA:  New onset of right lower quadrant pain. Hematemesis. Symptoms began last night. EXAM: CT ABDOMEN AND PELVIS WITH CONTRAST TECHNIQUE: Multidetector CT imaging of the abdomen and pelvis was performed using the standard protocol following bolus administration of intravenous contrast. CONTRAST:  50mL OMNIPAQUE IOHEXOL 300 MG/ML SOLN, 139mL OMNIPAQUE IOHEXOL 300 MG/ML SOLN COMPARISON:  Ultrasound 09/03/2009 FINDINGS: There is infiltrate and volume loss in the right lower lobe consistent with pneumonia. The liver is normal except for a sub cm cyst at the dome. No calcified gallstones. The common duct is slightly prominent, but no obstructing lesion is seen. The spleen is normal. The pancreas is normal. The adrenal glands are normal. The kidneys are normal. There is atherosclerosis of the aorta but no aneurysm. The IVC is normal. No retroperitoneal mass or lymphadenopathy. The small bowel is dilated to the level of the mid ileum. The distal ileum does not appear dilated. There are surgical clips at the region of the cecum consistent with previous appendectomy. I do not identify a specific obstructing lesion. Uterus and adnexal regions are unremarkable. There chronic degenerative changes of the lower lumbar spine. IMPRESSION: Small bowel obstruction, probably in the mid ileal region.  Exact etiology not determined. Surgical clips at the tip of the cecum consistent with previous appendectomy. Abnormal density in the right lower lobe consistent with pneumonia. Electronically Signed   By: Nelson Chimes M.D.   On: 03/24/2015 10:37   Dg Abd 2 Views  03/25/2015  CLINICAL DATA:  82 year old female with small bowel obstruction on body CT. Abdominal pain and hematemesis. Initial encounter. EXAM: ABDOMEN - 2 VIEW COMPARISON:  CT Abdomen and Pelvis 03/24/2015. FINDINGS: Right lower quadrant staple line re- demonstrated. The CT oral contrast has reached the colon, including distally at the level of the rectum. Occasional colonic diverticula. There is excreted IV contrast in the urinary bladder. No dilated or opacified small bowel loops. No pneumoperitoneum. Abdominal and pelvic visceral contours are stable and within normal limits. No acute osseous abnormality identified. Increased streaky opacity at the left lung base, favor atelectasis. IMPRESSION: 1. Resolved small bowel obstruction. Oral contrast present now throughout the  large bowel. 2. No free air. 3. Increased streaky lung base opacity, favor atelectasis. Electronically Signed   By: Genevie Ann M.D.   On: 03/25/2015 09:00    Microbiology: Recent Results (from the past 240 hour(s))  Culture, blood (routine x 2) Call MD if unable to obtain prior to antibiotics being given     Status: None (Preliminary result)   Collection Time: 03/24/15  1:55 PM  Result Value Ref Range Status   Specimen Description BLOOD RIGHT HAND  Final   Special Requests   Final    BOTTLES DRAWN AEROBIC AND ANAEROBIC 10CC BOTH BOTTLES   Culture   Final    NO GROWTH 2 DAYS Performed at [facility]    Report Status PENDING  Incomplete  Culture, blood (routine x 2) Call MD if unable to obtain prior to antibiotics being given     Status: None (Preliminary result)   Collection Time: 03/24/15  2:00 PM  Result Value Ref Range Status   Specimen Description BLOOD  RIGHT HAND  Final   Special Requests   Final    BOTTLES DRAWN AEROBIC AND ANAEROBIC 10CC BOTH BOTTLES   Culture   Final    NO GROWTH 2 DAYS Performed at [facility]    Report Status PENDING  Incomplete     Labs: Basic Metabolic Panel:  Recent Labs Lab 03/24/15 0850 03/25/15 0424  NA 139 139  K 3.9 4.0  CL 97* 109  CO2 29 23  GLUCOSE 130* 92  BUN 13 10  CREATININE 0.97 0.77  CALCIUM 10.3 8.5*   Liver Function Tests:  Recent Labs Lab 03/24/15 0850  AST 27  ALT 19  ALKPHOS 68  BILITOT 0.4  PROT 8.1  ALBUMIN 4.3    Recent Labs Lab 03/24/15 0850  LIPASE 39   No results for input(s): AMMONIA in the last 168 hours. CBC:  Recent Labs Lab 03/24/15 0850 03/25/15 0424  WBC 9.4 5.9  HGB 13.2 9.8*  HCT 41.7 31.2*  MCV 81.1 83.4  PLT 286 224   Cardiac Enzymes: No results for input(s): CKTOTAL, CKMB, CKMBINDEX, TROPONINI in the last 168 hours. BNP: BNP (last 3 results) No results for input(s): BNP in the last 8760 hours.  ProBNP (last 3 results) No results for input(s): PROBNP in the last 8760 hours.  CBG: No results for input(s): GLUCAP in the last 168 hours.     SignedDomenic Polite MD.  [facility] 03/27/2015, 8:57 AM

## 2015-03-27 NOTE — Care Management Important Message (Signed)
Important Message  Patient Details  Name: LYNDI LIMAS MRN: [id number] Date of Birth: 04/07/1928   Medicare Important Message Given:  Yes    Camillo Flaming 03/27/2015, 1:19 PMImportant Message  Patient Details  Name: KIMIRA COLONNA MRN: [id number] Date of Birth: June 23, 1928   Medicare Important Message Given:  Yes    Camillo Flaming 03/27/2015, 1:19 PM

## 2015-03-27 NOTE — Progress Notes (Signed)
  Subjective: Pt tolerating soft diet w/o complaints. Notes 2 BMs since 7am this morning after eating eggs and grits for breakfast. +flatus. Ambulating well. Denies nausea, fever, cough, SOB, HA.  Objective: Vital signs in last 24 hours: Temp:  [98.3 F (36.8 C)-98.6 F (37 C)] 98.5 F (36.9 C) (03/08 0511) Pulse Rate:  [69-72] 69 (03/08 0511) Resp:  [14-16] 14 (03/08 0511) BP: (148-153)/(62-80) 152/66 mmHg (03/08 0511) SpO2:  [98 %-100 %] 100 % (03/08 0511) Weight:  [70.308 kg (155 lb)] 70.308 kg (155 lb) (03/07 1038) Last BM Date: 03/27/15  BM x2 this am since 0700 Afebrile, VSS  Intake/Output from previous day: 03/07 0701 - 03/08 0700 In: 1440 [P.O.:240; I.V.:1200] Out: -  Intake/Output this shift:    Gen: pleasant, alert WDWN AA female in NAD Abd: soft, NDNT, +BS, no masses or organomegaly  Lab Results:   Recent Labs  03/25/15 0424  WBC 5.9  HGB 9.8*  HCT 31.2*  PLT 224   BMET  Recent Labs  03/25/15 0424  NA 139  K 4.0  CL 109  CO2 23  GLUCOSE 92  BUN 10  CREATININE 0.77  CALCIUM 8.5*   PT/INR No results for input(s): LABPROT, INR in the last 72 hours. ABG No results for input(s): PHART, HCO3 in the last 72 hours.  Invalid input(s): PCO2, PO2  Studies/Results: No results found.  Anti-infectives: Anti-infectives    Start     Dose/Rate Route Frequency Ordered Stop   03/27/15 0000  levofloxacin (LEVAQUIN) 500 MG tablet     500 mg Oral Daily 03/27/15 0856     03/24/15 1500  azithromycin (ZITHROMAX) 500 mg in dextrose 5 % 250 mL IVPB     500 mg 250 mL/hr over 60 Minutes Intravenous Every 24 hours 03/24/15 1329 03/31/15 1459   03/24/15 1400  cefTRIAXone (ROCEPHIN) 1 g in dextrose 5 % 50 mL IVPB     1 g 100 mL/hr over 30 Minutes Intravenous Every 24 hours 03/24/15 1329 03/31/15 1359      Assessment/Plan SBO - tolerating soft diet RLL pneumonia Hx of colon resection and colostomy reversal 30 years ago Hypertension Anxiety lacerataion R  middle finger 03/22/15  Antibiotics: Zithromax/Rocephin day #4, Levaquin day #1 DVT proph: [place] Lovenox  1. Continue soft diet 2. Ambulate and IS 3. Follow progression of RLL pneumonia 4. Discuss d/c to home with Dr. Barry Dienes    LOS: 3 days    Delma Post, [place] 03/27/2015

## 2015-03-29 LAB — CULTURE, BLOOD (ROUTINE X 2)
CULTURE: NO GROWTH
CULTURE: NO GROWTH

## 2015-03-29 NOTE — Progress Notes (Signed)
Pt's vitals were WNL, tolerating diet and pain is under control. Discussed discharge instructions with patient. Discharged to home with prescriptions.

## 2016-02-26 ENCOUNTER — Inpatient Hospital Stay (HOSPITAL_COMMUNITY): Payer: Medicare Other

## 2016-02-26 ENCOUNTER — Encounter (HOSPITAL_COMMUNITY): Payer: Self-pay | Admitting: Emergency Medicine

## 2016-02-26 ENCOUNTER — Emergency Department (HOSPITAL_COMMUNITY): Payer: Medicare Other

## 2016-02-26 ENCOUNTER — Inpatient Hospital Stay (HOSPITAL_COMMUNITY)
Admission: EM | Admit: 2016-02-26 | Discharge: 2016-02-28 | DRG: 390 | Disposition: A | Discharge status: Home / Self Care | Payer: Medicare Other | Attending: Internal Medicine | Admitting: Internal Medicine

## 2016-02-26 DIAGNOSIS — F419 Anxiety disorder, unspecified: Secondary | ICD-10-CM | POA: Diagnosis not present

## 2016-02-26 DIAGNOSIS — Z9841 Cataract extraction status, right eye: Secondary | ICD-10-CM

## 2016-02-26 DIAGNOSIS — F411 Generalized anxiety disorder: Secondary | ICD-10-CM | POA: Diagnosis present

## 2016-02-26 DIAGNOSIS — K219 Gastro-esophageal reflux disease without esophagitis: Secondary | ICD-10-CM | POA: Diagnosis not present

## 2016-02-26 DIAGNOSIS — K565 Intestinal adhesions [bands], unspecified as to partial versus complete obstruction: Principal | ICD-10-CM | POA: Diagnosis present

## 2016-02-26 DIAGNOSIS — Z96653 Presence of artificial knee joint, bilateral: Secondary | ICD-10-CM | POA: Diagnosis present

## 2016-02-26 DIAGNOSIS — Z9842 Cataract extraction status, left eye: Secondary | ICD-10-CM

## 2016-02-26 DIAGNOSIS — K56609 Unspecified intestinal obstruction, unspecified as to partial versus complete obstruction: Secondary | ICD-10-CM | POA: Diagnosis not present

## 2016-02-26 DIAGNOSIS — I10 Essential (primary) hypertension: Secondary | ICD-10-CM | POA: Diagnosis present

## 2016-02-26 DIAGNOSIS — Z79899 Other long term (current) drug therapy: Secondary | ICD-10-CM

## 2016-02-26 DIAGNOSIS — R109 Unspecified abdominal pain: Secondary | ICD-10-CM | POA: Diagnosis present

## 2016-02-26 HISTORY — DX: Unspecified intestinal obstruction, unspecified as to partial versus complete obstruction: K56.609

## 2016-02-26 LAB — URINALYSIS, ROUTINE W REFLEX MICROSCOPIC
Bacteria, UA: NONE SEEN
Bilirubin Urine: NEGATIVE
GLUCOSE, UA: NEGATIVE mg/dL
Hgb urine dipstick: NEGATIVE
Ketones, ur: NEGATIVE mg/dL
Nitrite: NEGATIVE
PH: 7 (ref 5.0–8.0)
Protein, ur: NEGATIVE mg/dL
SPECIFIC GRAVITY, URINE: 1.016 (ref 1.005–1.030)

## 2016-02-26 LAB — CBC
HCT: 39 % (ref 36.0–46.0)
Hemoglobin: 12.8 g/dL (ref 12.0–15.0)
MCH: 27.6 pg (ref 26.0–34.0)
MCHC: 32.8 g/dL (ref 30.0–36.0)
MCV: 84.1 fL (ref 78.0–100.0)
PLATELETS: 176 10*3/uL (ref 150–400)
RBC: 4.64 MIL/uL (ref 3.87–5.11)
RDW: 15.1 % (ref 11.5–15.5)
WBC: 6.1 10*3/uL (ref 4.0–10.5)

## 2016-02-26 LAB — COMPREHENSIVE METABOLIC PANEL
ALK PHOS: 53 U/L (ref 38–126)
ALT: 18 U/L (ref 14–54)
AST: 27 U/L (ref 15–41)
Albumin: 4 g/dL (ref 3.5–5.0)
Anion gap: 11 (ref 5–15)
BUN: 15 mg/dL (ref 6–20)
CALCIUM: 9.5 mg/dL (ref 8.9–10.3)
CO2: 24 mmol/L (ref 22–32)
CREATININE: 0.97 mg/dL (ref 0.44–1.00)
Chloride: 100 mmol/L — ABNORMAL LOW (ref 101–111)
GFR, EST NON AFRICAN AMERICAN: 52 mL/min — AB (ref >60)
Glucose, Bld: 111 mg/dL — ABNORMAL HIGH (ref 65–99)
Potassium: 4.4 mmol/L (ref 3.5–5.1)
SODIUM: 135 mmol/L (ref 135–145)
Total Bilirubin: 0.7 mg/dL (ref 0.3–1.2)
Total Protein: 7.6 g/dL (ref 6.5–8.1)

## 2016-02-26 LAB — LIPASE, BLOOD: LIPASE: 24 U/L (ref 11–51)

## 2016-02-26 MED ORDER — LIDOCAINE HCL 2 % EX GEL
1.0000 "application " | Freq: Once | CUTANEOUS | Status: DC
  Filled 2016-02-26: qty 11

## 2016-02-26 MED ORDER — SODIUM CHLORIDE 0.9 % IJ SOLN
INTRAMUSCULAR | Status: AC
  Filled 2016-02-26: qty 50

## 2016-02-26 MED ORDER — ONDANSETRON HCL 4 MG PO TABS: 4.0000 mg | ORAL_TABLET | Freq: Four times a day (QID) | ORAL | Status: DC | PRN

## 2016-02-26 MED ORDER — ONDANSETRON HCL 4 MG/2ML IJ SOLN: 4.0000 mg | Freq: Four times a day (QID) | INTRAMUSCULAR | Status: DC | PRN

## 2016-02-26 MED ORDER — IOPAMIDOL (ISOVUE-300) INJECTION 61%
INTRAVENOUS | Status: AC
  Filled 2016-02-26: qty 100

## 2016-02-26 MED ORDER — HYDRALAZINE HCL 20 MG/ML IJ SOLN: 5.0000 mg | Freq: Four times a day (QID) | INTRAMUSCULAR | Status: DC | PRN

## 2016-02-26 MED ORDER — FENTANYL CITRATE (PF) 100 MCG/2ML IJ SOLN
50.0000 ug | Freq: Once | INTRAMUSCULAR | Status: AC
  Administered 2016-02-26: 50 ug via INTRAVENOUS
  Filled 2016-02-26: qty 2

## 2016-02-26 MED ORDER — DIATRIZOATE MEGLUMINE & SODIUM 66-10 % PO SOLN
90.0000 mL | Freq: Once | ORAL | Status: AC
  Administered 2016-02-26: 90 mL via NASOGASTRIC
  Filled 2016-02-26: qty 90

## 2016-02-26 MED ORDER — SODIUM CHLORIDE 0.9 % IV SOLN
INTRAVENOUS | Status: DC
  Administered 2016-02-26 – 2016-02-27 (×4): via INTRAVENOUS
  Administered 2016-02-28: 75 mL/h via INTRAVENOUS

## 2016-02-26 MED ORDER — ENOXAPARIN SODIUM 40 MG/0.4ML ~~LOC~~ SOLN
40.0000 mg | SUBCUTANEOUS | Status: DC
  Administered 2016-02-26 – 2016-02-27 (×2): 40 mg via SUBCUTANEOUS
  Filled 2016-02-26 (×2): qty 0.4

## 2016-02-26 MED ORDER — IOPAMIDOL (ISOVUE-300) INJECTION 61%
100.0000 mL | Freq: Once | INTRAVENOUS | Status: AC | PRN
  Administered 2016-02-26: 100 mL via INTRAVENOUS

## 2016-02-26 MED ORDER — LORAZEPAM 2 MG/ML IJ SOLN
0.5000 mg | Freq: Four times a day (QID) | INTRAMUSCULAR | Status: DC | PRN
  Administered 2016-02-27: 0.5 mg via INTRAVENOUS
  Filled 2016-02-26: qty 1

## 2016-02-26 MED ORDER — MORPHINE SULFATE (PF) 2 MG/ML IV SOLN: 2.0000 mg | INTRAVENOUS | Status: DC | PRN

## 2016-02-26 MED ORDER — SODIUM CHLORIDE 0.9 % IV BOLUS (SEPSIS)
1000.0000 mL | Freq: Once | INTRAVENOUS | Status: AC
  Administered 2016-02-26: 1000 mL via INTRAVENOUS

## 2016-02-26 MED ORDER — KETOROLAC TROMETHAMINE 15 MG/ML IJ SOLN
15.0000 mg | Freq: Four times a day (QID) | INTRAMUSCULAR | Status: DC | PRN
  Administered 2016-02-27: 15 mg via INTRAVENOUS
  Filled 2016-02-26: qty 1

## 2016-02-26 MED ORDER — ONDANSETRON HCL 4 MG/2ML IJ SOLN
4.0000 mg | Freq: Once | INTRAMUSCULAR | Status: AC
  Administered 2016-02-26: 4 mg via INTRAVENOUS
  Filled 2016-02-26: qty 2

## 2016-02-26 NOTE — H&P (Addendum)
History and Physical    [name] [name] [id number] DOB: May 14, 1928  DOA: 02/26/2016 PCP: [name], MD  Patient coming from: Home   Chief Complaint: Abdominal pain   HPI: ALOUISE [name] is a 83 y.o. female with medical history significant of HTN presented to the ED with acute severe abdominal pain, since last night. Associated symptoms include nausea, vomiting and abdominal distension. Patient had previous hx of SBO in march 2017, treated conservatively. Patient denies recent surgery, constipation and weight loss.   ED Course: CT abdomen show Recurrent SBO   Review of Systems:   General: no changes in body weight, no fever chills or decrease in energy.  HEENT: no blurry vision, hearing changes or sore throat Respiratory: no dyspnea, coughing, wheezing CV: no chest pain, no palpitations GI: See HPI  GU: no dysuria, burning on urination, increased urinary frequency, hematuria  Ext:. No deformities,  Neuro: no unilateral weakness, numbness, or tingling, no vision change or hearing loss Skin: No rashes, lesions or wounds. MSK: No muscle spasm, no deformity, no limitation of range of movement in spin Heme: No easy bruising.  Travel history: No recent long distant travel.   Past Medical History:  Diagnosis Date  . Allergy    SEASONAL  . Anemia   . Anxiety   . Arthritis    KNEES/HANDS  . 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 Blind loop syndrome   . Complication of anesthesia    pt can't remember exact complication, but she remembers about 30 years ago she may have had trouble waking up from anesthesia.  . Fatty liver   . GERD (gastroesophageal reflux disease)   . Hiatal hernia   . Hypertension   . MVP (mitral valve prolapse)   . PVC's (premature ventricular contractions)   . Schatzki's ring   . Small bowel obstruction   . Vitamin B12 deficiency     Past Surgical History:  Procedure Laterality Date  . CATARACT EXTRACTION, BILATERAL  03-26-11  .  HEMICOLECTOMY     and ileectomy Dr Vida Rigger; due to perforated viscus  . I&D KNEE WITH POLY EXCHANGE  04/03/2011   Procedure: IRRIGATION AND DEBRIDEMENT KNEE WITH POLY EXCHANGE;  Surgeon: Mcarthur Rossetti, MD;  Location: [facility];  Service: Orthopedics;  Laterality: Right;  . KNEE CLOSED REDUCTION  01/22/2011   Procedure: CLOSED MANIPULATION KNEE;  Surgeon: Mcarthur Rossetti;  Location: [place];  Service: Orthopedics;  Laterality: Right;  Manipulation under anesthesia right knee  . Nuclear stress test  Sept 2012   Normal  . REPLACEMENT TOTAL KNEE     right  . ROTATOR CUFF REPAIR  03-26-11   right arm  . SYNOVECTOMY  04/03/2011   Procedure: SYNOVECTOMY;  Surgeon: Mcarthur Rossetti, MD;  Location: [facility];  Service: Orthopedics;  Laterality: Right;  . TOTAL KNEE ARTHROPLASTY Left 11/13/2014   Procedure: TOTAL KNEE ARTHROPLASTY;  Surgeon: Paralee Cancel, MD;  Location: [facility];  Service: Orthopedics;  Laterality: Left;  . TOTAL KNEE REVISION Right 07/14/2012   Procedure: RIGHT TOTAL KNEE REVISION;  Surgeon: Mauri Pole, MD;  Location: [facility];  Service: Orthopedics;  Laterality: Right;  . TUBAL LIGATION       reports that she has never smoked. She has never used smokeless tobacco. She reports that she does not drink alcohol or use drugs.  No Known Allergies  Family History  Problem Relation Age of Onset  . Hypertension Sister   . Diabetes Sister   .  Diabetes Brother   . Coronary artery disease Brother   . Deep vein thrombosis Sister   . Colon cancer Neg Hx   . Colon polyps Neg Hx    Family history reviewed and not pertinent  Prior to Admission medications   Medication Sig Start Date End Date Taking? Authorizing Provider  acetaminophen (TYLENOL) 500 MG tablet Take 1,000 mg by mouth every 6 (six) hours as needed for moderate pain.   Yes Historical Provider, MD  ALPRAZolam (XANAX) 0.25 MG tablet Take 1 tablet (0.25 mg total) by mouth 4 (four) times daily as needed. ANXIETY/  SPASM Patient taking differently: Take 0.25 mg by mouth 4 (four) times daily as needed for anxiety.  07/15/12  Yes Matthew Babish, PA-C  amLODipine (NORVASC) 5 MG tablet Take 5 mg by mouth daily. 03/21/15  Yes Historical Provider, MD  fish oil-omega-3 fatty acids 1000 MG capsule Take 1 g by mouth daily.    Yes Historical Provider, MD  loratadine (CLARITIN) 10 MG tablet Take 10 mg by mouth daily as needed for allergies.    Yes Historical Provider, MD  OVER THE COUNTER MEDICATION Take 1 tablet by mouth 3 (three) times daily as needed (pain). Curamin   Yes Historical Provider, MD  traMADol (ULTRAM) 50 MG tablet Take 50-100 mg by mouth every 6 (six) hours as needed for moderate pain.  Every 6-8 hours as needed for pain 03/09/15  Yes Historical Provider, MD  Vitamin D, Ergocalciferol, (DRISDOL) 50000 UNITS CAPS Take 50,000 Units by mouth every 7 (seven) days. Friday   Yes Historical Provider, MD  VOLTAREN 1 % GEL Apply 2 g topically 3 (three) times daily as needed (pain). To right knee as needed for pain 12/26/14  Yes Historical Provider, MD    Physical Exam: Vitals:   02/26/16 0951 02/26/16 1001 02/26/16 1004 02/26/16 1056  BP:  165/79 165/79 157/71  Pulse:  75 75 61  Resp: 18  18 20   Temp:      TempSrc:      SpO2:  99% 99% 97%    Constitutional: NAD, calm, comfortable Eyes: PERRL, lids and conjunctivae normal ENMT: NGT in place with clear fluids  Neck: normal, supple, no masses, no thyromegaly Respiratory: clear to auscultation bilaterally, no wheezing, no crackles. Normal respiratory effort.  Cardiovascular: Regular rate and rhythm, no murmurs / rubs / gallops. No extremity edema. 2+ pedal pulses.  Abdomen: Soft mild distended, diffuses tenderness more predominant on the left.   Musculoskeletal: no clubbing / cyanosis. No joint deformity upper and lower extremities. Good ROM, no contractures.   Skin: no rashes, lesions, ulcers. Neurologic: CN 2-12 grossly intact. Sensation intact. Strength  5/5 in all 4.  Psychiatric: Normal judgment and insight. Alert and oriented x 3. Normal mood.    Labs on Admission: I have personally reviewed following labs and imaging studies  CBC:  Recent Labs Lab 02/26/16 0819  WBC 6.1  HGB 12.8  HCT 39.0  MCV 84.1  PLT 0000000   Basic Metabolic Panel:  Recent Labs Lab 02/26/16 0819  NA 135  K 4.4  CL 100*  CO2 24  GLUCOSE 111*  BUN 15  CREATININE 0.97  CALCIUM 9.5   GFR: CrCl cannot be calculated (Unknown ideal weight.). Liver Function Tests:  Recent Labs Lab 02/26/16 0819  AST 27  ALT 18  ALKPHOS 53  BILITOT 0.7  PROT 7.6  ALBUMIN 4.0    Recent Labs Lab 02/26/16 0819  LIPASE 24   No results for input(s):  AMMONIA in the last 168 hours. Coagulation Profile: No results for input(s): INR, PROTIME in the last 168 hours. Cardiac Enzymes: No results for input(s): CKTOTAL, CKMB, CKMBINDEX, TROPONINI in the last 168 hours. BNP (last 3 results) No results for input(s): PROBNP in the last 8760 hours. HbA1C: No results for input(s): HGBA1C in the last 72 hours. CBG: No results for input(s): GLUCAP in the last 168 hours. Lipid Profile: No results for input(s): CHOL, HDL, LDLCALC, TRIG, CHOLHDL, LDLDIRECT in the last 72 hours. Thyroid Function Tests: No results for input(s): TSH, T4TOTAL, FREET4, T3FREE, THYROIDAB in the last 72 hours. Anemia Panel: No results for input(s): VITAMINB12, FOLATE, FERRITIN, TIBC, IRON, RETICCTPCT in the last 72 hours. Urine analysis:    Component Value Date/Time   COLORURINE STRAW (A) 02/26/2016 1025   APPEARANCEUR CLEAR 02/26/2016 1025   LABSPEC 1.016 02/26/2016 1025   PHURINE 7.0 02/26/2016 1025   GLUCOSEU NEGATIVE 02/26/2016 1025   HGBUR NEGATIVE 02/26/2016 1025   BILIRUBINUR NEGATIVE 02/26/2016 1025   KETONESUR NEGATIVE 02/26/2016 1025   PROTEINUR NEGATIVE 02/26/2016 1025   UROBILINOGEN 0.2 11/02/2014 0900   NITRITE NEGATIVE 02/26/2016 1025   LEUKOCYTESUR TRACE (A) 02/26/2016  1025    Radiological Exams on Admission: Ct Abdomen Pelvis W Contrast  Result Date: 02/26/2016 CLINICAL DATA:  Abdominal pain with vomiting beginning yesterday. History of small bowel obstruction. EXAM: CT ABDOMEN AND PELVIS WITH CONTRAST TECHNIQUE: Multidetector CT imaging of the abdomen and pelvis was performed using the standard protocol following bolus administration of intravenous contrast. CONTRAST:  187mL ISOVUE-300 IOPAMIDOL (ISOVUE-300) INJECTION 61% COMPARISON:  03/24/2015 FINDINGS: Lower chest: Chronic volume loss in the right lower lobe. Active right lower lobe inflammation not excluded. No effusions. Mild chronic scarring at the left base as well. Hepatobiliary: Chronic 8 mm cyst at the dome of the liver. No other liver finding. Gallbladder is distended but otherwise unremarkable. No calcified stones or visible inflammatory change. Pancreas: Normal.  Duodenum diverticulum. Spleen: Normal Adrenals/Urinary Tract: Mild adrenal hypertrophy. No focal lesion. Kidneys are normal without cyst, mass, stone or hydronephrosis. Stomach/Bowel: Dilated loops of small intestine consistent with small bowel obstruction. Caliber change in the mid ileum. Bowel sutures noted at the ileocecal region. Vascular/Lymphatic: Aortic atherosclerosis. No aneurysm. IVC is normal. No retroperitoneal mass or adenopathy. Reproductive: Negative Other: No free fluid or air. Musculoskeletal: Advanced chronic lower lumbar degenerative changes. IMPRESSION: Recurrent small bowel obstruction in the mid ileal region. Chronic volume loss at the right lung base. Element of active inflammation in that region not excluded. Aortic atherosclerosis. Electronically Signed   By: Nelson Chimes M.D.   On: 02/26/2016 09:48    Assessment/Plan Recurrent SBO (small bowel obstruction) - CT shows recurrent small bowel obstruction in the mid ileal region. Abdominal surgical hx of colon resection/colostomy 30-40 yrs ago Admit to [facility]  Surgery  consult  NGT  Keep NPO, IVF, antiemetic  Repeat Abd xray in AM  Pain management PRN   HTN - stable, on amlodipine at home  Will start hydralazine PRN given patient NPO, 5 mg if SBP > 160 Monitor BP   Anxiety  Ativan 0.5,g q6 PRN   DVT prophylaxis: Lovenox  Code Status: FULL  Family Communication: Daughter at bedside  Disposition Plan: Anticipate discharge to previous home environment.  Consults called: Surgery  Admission status: Inpatient medsurg   Chipper Oman MD [facility] Pager [phone]  If 7PM-7AM, please contact night-coverage [email or web address] Password [email or web address]  02/26/2016, 11:03 AM

## 2016-02-26 NOTE — Consult Note (Signed)
[facility] Surgery Consult Note  [name] [name] 12/19/1928  [id number].    Requesting MD: Cardama Chief Complaint/Reason for Consult: SBO  HPI:  [name] [name] is an 83yo female who presented to [facility] earlier today with acute onset and gradually worsening abdominal pain. Patient has a history of colon resection/colostomy about 30-40 years ago for "a hole in my bowel", and subsequent colostomy reversal. She was recently admitted 03/2015 with a SBO that resolved without surgical intervention. States that her abdominal pain began around 4PM yesterday. It has moved around her abdomen, but currently is most severe in LLQ. It is constant and worse with moving. Her pain is associated with nausea and vomiting. Denies fever, chills, or dysuria. States that she has not passed any flatus today, but she did have a soft BM this morning. Last meal was yesterday.  PMH significant for HTN, multiple orthopedic surgeries Abdominal surgical history includes a colon resection/colostomy about 30-40 years ago for "hole in my bowel", colostomy reversal Denies home use of anticoagulants Nonsmoker Lives at home by herself, husband passed away last year. Uses cane for ambulation.  ROS: Review of Systems  Constitutional: Negative.   HENT: Negative.   Eyes: Negative.   Respiratory: Negative.   Cardiovascular: Negative.   Gastrointestinal: Positive for abdominal pain, nausea and vomiting. Negative for constipation and diarrhea.  Genitourinary: Negative.   Skin: Negative.   All systems reviewed and otherwise negative except for as above  Family History  Problem Relation Age of Onset  . Hypertension Sister   . Diabetes Sister   . Diabetes Brother   . Coronary artery disease Brother   . Deep vein thrombosis Sister   . Colon cancer Neg Hx   . Colon polyps Neg Hx     Past Medical History:  Diagnosis Date  . Allergy    SEASONAL  . Anemia   . Anxiety   . Arthritis    KNEES/HANDS  . Atrophic  gastritis without mention of hemorrhage   . Blind loop syndrome   . Complication of anesthesia    pt can't remember exact complication, but she remembers about 30 years ago she may have had trouble waking up from anesthesia.  . Fatty liver   . GERD (gastroesophageal reflux disease)   . Hiatal hernia   . Hypertension   . MVP (mitral valve prolapse)   . PVC's (premature ventricular contractions)   . Schatzki's ring   . Small bowel obstruction   . Vitamin B12 deficiency     Past Surgical History:  Procedure Laterality Date  . CATARACT EXTRACTION, BILATERAL  03-26-11  . HEMICOLECTOMY     and ileectomy Dr Rolene Course; due to perforated viscus  . I&D KNEE WITH POLY EXCHANGE  04/03/2011   Procedure: IRRIGATION AND DEBRIDEMENT KNEE WITH POLY EXCHANGE;  Surgeon: Kathryne Hitch, MD;  Location: [facility];  Service: Orthopedics;  Laterality: Right;  . KNEE CLOSED REDUCTION  01/22/2011   Procedure: CLOSED MANIPULATION KNEE;  Surgeon: Kathryne Hitch;  Location: [facility];  Service: Orthopedics;  Laterality: Right;  Manipulation under anesthesia right knee  . Nuclear stress test  Sept 2012   Normal  . REPLACEMENT TOTAL KNEE     right  . ROTATOR CUFF REPAIR  03-26-11   right arm  . SYNOVECTOMY  04/03/2011   Procedure: SYNOVECTOMY;  Surgeon: Kathryne Hitch, MD;  Location: [facility];  Service: Orthopedics;  Laterality: Right;  . TOTAL KNEE ARTHROPLASTY Left 11/13/2014   Procedure: TOTAL KNEE ARTHROPLASTY;  Surgeon: Paralee Cancel, MD;  Location: [facility];  Service: Orthopedics;  Laterality: Left;  . TOTAL KNEE REVISION Right 07/14/2012   Procedure: RIGHT TOTAL KNEE REVISION;  Surgeon: Mauri Pole, MD;  Location: [facility];  Service: Orthopedics;  Laterality: Right;  . TUBAL LIGATION      Social History:  reports that she has never smoked. She has never used smokeless tobacco. She reports that she does not drink alcohol or use drugs.  Allergies: No Known Allergies   (Not in a hospital  admission)  Prior to Admission medications   Medication Sig Start Date End Date Taking? Authorizing Provider  acetaminophen (TYLENOL) 500 MG tablet Take 1,000 mg by mouth every 6 (six) hours as needed for moderate pain.   Yes Historical Provider, MD  ALPRAZolam (XANAX) 0.25 MG tablet Take 1 tablet (0.25 mg total) by mouth 4 (four) times daily as needed. ANXIETY/ SPASM Patient taking differently: Take 0.25 mg by mouth 4 (four) times daily as needed for anxiety.  07/15/12  Yes Matthew Babish, PA-C  amLODipine (NORVASC) 5 MG tablet Take 5 mg by mouth daily. 03/21/15  Yes Historical Provider, MD  fish oil-omega-3 fatty acids 1000 MG capsule Take 1 g by mouth daily.    Yes Historical Provider, MD  loratadine (CLARITIN) 10 MG tablet Take 10 mg by mouth daily as needed for allergies.    Yes Historical Provider, MD  OVER THE COUNTER MEDICATION Take 1 tablet by mouth 3 (three) times daily as needed (pain). Curamin   Yes Historical Provider, MD  traMADol (ULTRAM) 50 MG tablet Take 50-100 mg by mouth every 6 (six) hours as needed for moderate pain.  Every 6-8 hours as needed for pain 03/09/15  Yes Historical Provider, MD  Vitamin D, Ergocalciferol, (DRISDOL) 50000 UNITS CAPS Take 50,000 Units by mouth every 7 (seven) days. Friday   Yes Historical Provider, MD  VOLTAREN 1 % GEL Apply 2 g topically 3 (three) times daily as needed (pain). To right knee as needed for pain 12/26/14  Yes Historical Provider, MD    Blood pressure 157/71, pulse 61, temperature 98.5 F (36.9 C), temperature source Oral, resp. rate 20, SpO2 97 %. Physical Exam: General: pleasant, WD/WN AA female who is laying in bed in NAD HEENT: head is normocephalic, atraumatic.  Sclera are noninjected.  Mouth is dry Heart: regular, rate, and rhythm.  No obvious murmurs, gallops, or rubs noted.  Palpable pedal pulses bilaterally Lungs: CTAB, no wheezes, rhonchi, or rales noted.  Respiratory effort nonlabored Abd: well healed vertical midline incision  and RLQ previous colostomy site, soft, mild distension, +BS, no masses, hernias, or organomegaly. TTP LLQ MS: all 4 extremities are symmetrical with no cyanosis, clubbing, or edema. Skin: warm and dry with no masses, lesions, or rashes Psych: A&Ox3 with an appropriate affect. Neuro: CM 2-12 intact, extremity CSM intact bilaterally, normal speech  Results for orders placed or performed during the hospital encounter of 02/26/16 (from the past 48 hour(s))  Lipase, blood     Status: None   Collection Time: 02/26/16  8:19 AM  Result Value Ref Range   Lipase 24 11 - 51 U/L  Comprehensive metabolic panel     Status: Abnormal   Collection Time: 02/26/16  8:19 AM  Result Value Ref Range   Sodium 135 135 - 145 mmol/L   Potassium 4.4 3.5 - 5.1 mmol/L   Chloride 100 (L) 101 - 111 mmol/L   CO2 24 22 - 32 mmol/L   Glucose, Bld  111 (H) 65 - 99 mg/dL   BUN 15 6 - 20 mg/dL   Creatinine, Ser 5.49 0.44 - 1.00 mg/dL   Calcium 9.5 8.9 - 74.6 mg/dL   Total Protein 7.6 6.5 - 8.1 g/dL   Albumin 4.0 3.5 - 5.0 g/dL   AST 27 15 - 41 U/L   ALT 18 14 - 54 U/L   Alkaline Phosphatase 53 38 - 126 U/L   Total Bilirubin 0.7 0.3 - 1.2 mg/dL   GFR calc non Af Amer 52 (L) >60 mL/min   GFR calc Af Amer >60 >60 mL/min    Comment: (NOTE) The eGFR has been calculated using the CKD EPI equation. This calculation has not been validated in all clinical situations. eGFR's persistently <60 mL/min signify possible Chronic Kidney Disease.    Anion gap 11 5 - 15  CBC     Status: None   Collection Time: 02/26/16  8:19 AM  Result Value Ref Range   WBC 6.1 4.0 - 10.5 K/uL   RBC 4.64 3.87 - 5.11 MIL/uL   Hemoglobin 12.8 12.0 - 15.0 g/dL   HCT 81.0 39.0 - 27.1 %   MCV 84.1 78.0 - 100.0 fL   MCH 27.6 26.0 - 34.0 pg   MCHC 32.8 30.0 - 36.0 g/dL   RDW 82.6 38.0 - 01.3 %   Platelets 176 150 - 400 K/uL  Urinalysis, Routine w reflex microscopic     Status: Abnormal   Collection Time: 02/26/16 10:25 AM  Result Value Ref Range    Color, Urine STRAW (A) YELLOW   APPearance CLEAR CLEAR   Specific Gravity, Urine 1.016 1.005 - 1.030   pH 7.0 5.0 - 8.0   Glucose, UA NEGATIVE NEGATIVE mg/dL   Hgb urine dipstick NEGATIVE NEGATIVE   Bilirubin Urine NEGATIVE NEGATIVE   Ketones, ur NEGATIVE NEGATIVE mg/dL   Protein, ur NEGATIVE NEGATIVE mg/dL   Nitrite NEGATIVE NEGATIVE   Leukocytes, UA TRACE (A) NEGATIVE   RBC / HPF 0-5 0 - 5 RBC/hpf   WBC, UA 0-5 0 - 5 WBC/hpf   Bacteria, UA NONE SEEN NONE SEEN   Squamous Epithelial / LPF 0-5 (A) NONE SEEN   Mucous PRESENT    Ct Abdomen Pelvis W Contrast  Result Date: 02/26/2016 CLINICAL DATA:  Abdominal pain with vomiting beginning yesterday. History of small bowel obstruction. EXAM: CT ABDOMEN AND PELVIS WITH CONTRAST TECHNIQUE: Multidetector CT imaging of the abdomen and pelvis was performed using the standard protocol following bolus administration of intravenous contrast. CONTRAST:  ISOVUE-300 IOPAMIDOL (ISOVUE-300) INJECTION 61% COMPARISON:  03/24/2015 FINDINGS: Lower chest: Chronic volume loss in the right lower lobe. Active right lower lobe inflammation not excluded. No effusions. Mild chronic scarring at the left base as well. Hepatobiliary: Chronic 8 mm cyst at the dome of the liver. No other liver finding. Gallbladder is distended but otherwise unremarkable. No calcified stones or visible inflammatory change. Pancreas: Normal.  Duodenum diverticulum. Spleen: Normal Adrenals/Urinary Tract: Mild adrenal hypertrophy. No focal lesion. Kidneys are normal without cyst, mass, stone or hydronephrosis. Stomach/Bowel: Dilated loops of small intestine consistent with small bowel obstruction. Caliber change in the mid ileum. Bowel sutures noted at the ileocecal region. Vascular/Lymphatic: Aortic atherosclerosis. No aneurysm. IVC is normal. No retroperitoneal mass or adenopathy. Reproductive: Negative Other: No free fluid or air. Musculoskeletal: Advanced chronic lower lumbar degenerative  changes. IMPRESSION: Recurrent small bowel obstruction in the mid ileal region. Chronic volume loss at the right lung base. Element of active inflammation in  that region not excluded. Aortic atherosclerosis. Electronically Signed   By: Nelson Chimes M.D.   On: 02/26/2016 09:48      Assessment/Plan Recurrent SBO - Abdominal surgical history includes a colon resection/colostomy about 30-40 years ago for "hole in my bowel", colostomy reversal - admission 3/25017 for SBO that resolved without surgical intervention - CT shows recurrent small bowel obstruction in the mid ileal region - WBC WNL, afebrile - NG tube in place  HTN Multiple orthopedic surgeries, takes pain medication PRN but this is not daily (patient unsure which medication it is)  ID - none FEN - IVF, NPO/NGT  Plan - Patient with a recurrent SBO. Recommend admit to medicine. Agree with NG tube. We will start her on small bowel protocol. Continue NPO, bowel rest, IVF, pain control, antiemetics. Ambulate and IS. General surgery will continue to follow.  Jerrye Beavers, [facility] Surgery 02/26/2016, 11:05 AM Pager: [phone] Consults: [phone] Mon-Fri 7:00 am-4:30 pm Sat-Sun 7:00 am-11:30 am

## 2016-02-26 NOTE — ED Triage Notes (Signed)
Pt c/o abdominal pain that she describes as being similar to pain she experienced last year when having a SBO; pain and vomiting started yesterday at 4pm; last BM was this AM and was normal; denies diarrhea

## 2016-02-26 NOTE — ED Provider Notes (Signed)
[facility] DEPT Provider Note   CSN: [id number] Arrival date & time: 02/26/16  0717     History   Chief Complaint Chief Complaint  Patient presents with  . Abdominal Pain    HPI [name] [name] is a 83 y.o. female.  The history is provided by the patient.  Abdominal Pain   This is a recurrent problem. The current episode started 12 to 24 hours ago. The problem occurs constantly. The problem has not changed since onset.The pain is associated with an unknown factor. The pain is located in the periumbilical region, suprapubic region and LLQ. The pain is moderate. Associated symptoms include nausea and vomiting. Pertinent negatives include fever. Nothing aggravates the symptoms. Nothing relieves the symptoms. Past medical history comments: SBO.    Past Medical History:  Diagnosis Date  . Allergy    SEASONAL  . Anemia   . Anxiety   . Arthritis    KNEES/HANDS  . 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 Blind loop syndrome   . Complication of anesthesia    pt can't remember exact complication, but she remembers about 30 years ago she may have had trouble waking up from anesthesia.  . Fatty liver   . GERD (gastroesophageal reflux disease)   . Hiatal hernia   . Hypertension   . MVP (mitral valve prolapse)   . PVC's (premature ventricular contractions)   . Schatzki's ring   . Small bowel obstruction   . Vitamin B12 deficiency     Patient Active Problem List   Diagnosis Date Noted  . Aspiration pneumonia of right lower lobe ([place])   . Small bowel obstruction   . RLL pneumonia ([place]) 03/26/2015  . SBO (small bowel obstruction) 03/24/2015  . S/P left TKA 11/13/2014  . S/P knee replacement 11/13/2014  . Overweight (BMI 25.0-29.9) 07/15/2012  . Expected blood loss anemia 07/15/2012  . S/P right TK revision 07/14/2012  . Ankylosis of knee joint 04/03/2011  . Fibrosis of knee joint 01/22/2011  . Chest pain 10/20/2010  . Bacterial overgrowth syndrome  10/09/2010  . Status post right hemicolectomy 10/09/2010  . Esophageal dysphagia 10/09/2010  . B12 deficiency 10/09/2010  . DYSPHAGIA UNSPECIFIED 01/23/2010  . BLIND LOOP SYNDROME 08/26/2009  . GASTRITIS 12/26/2008  . WEIGHT LOSS-ABNORMAL 12/25/2008  . DYSPHAGIA 12/25/2008  . DIARRHEA 07/17/2008  . HOARSENESS 07/26/2007  . B12 DEFICIENCY 07/25/2007  . Essential hypertension 07/25/2007  . MITRAL VALVE PROLAPSE 07/25/2007  . PREMATURE VENTRICULAR CONTRACTIONS 07/25/2007  . ESOPHAGITIS, REFLUX 07/25/2007  . GERD 07/25/2007  . GASTRITIS, CHRONIC 07/25/2007  . HIATAL HERNIA 07/25/2007  . ABSCESS OF INTESTINE 07/25/2007    Past Surgical History:  Procedure Laterality Date  . CATARACT EXTRACTION, BILATERAL  03-26-11  . HEMICOLECTOMY     and ileectomy Dr Vida Rigger; due to perforated viscus  . I&D KNEE WITH POLY EXCHANGE  04/03/2011   Procedure: IRRIGATION AND DEBRIDEMENT KNEE WITH POLY EXCHANGE;  Surgeon: Mcarthur Rossetti, MD;  Location: [facility];  Service: Orthopedics;  Laterality: Right;  . KNEE CLOSED REDUCTION  01/22/2011   Procedure: CLOSED MANIPULATION KNEE;  Surgeon: Mcarthur Rossetti;  Location: [facility];  Service: Orthopedics;  Laterality: Right;  Manipulation under anesthesia right knee  . Nuclear stress test  Sept 2012   Normal  . REPLACEMENT TOTAL KNEE     right  . ROTATOR CUFF REPAIR  03-26-11   right arm  . SYNOVECTOMY  04/03/2011   Procedure: SYNOVECTOMY;  Surgeon: Mcarthur Rossetti, MD;  Location: [facility];  Service: Orthopedics;  Laterality: Right;  . TOTAL KNEE ARTHROPLASTY Left 11/13/2014   Procedure: TOTAL KNEE ARTHROPLASTY;  Surgeon: Paralee Cancel, MD;  Location: [facility];  Service: Orthopedics;  Laterality: Left;  . TOTAL KNEE REVISION Right 07/14/2012   Procedure: RIGHT TOTAL KNEE REVISION;  Surgeon: Mauri Pole, MD;  Location: [facility];  Service: Orthopedics;  Laterality: Right;  . TUBAL LIGATION      OB History    No data available       Home  Medications    Prior to Admission medications   Medication Sig Start Date End Date Taking? Authorizing Provider  acetaminophen (TYLENOL) 500 MG tablet Take 1,000 mg by mouth every 6 (six) hours as needed for moderate pain.   Yes Historical Provider, MD  ALPRAZolam (XANAX) 0.25 MG tablet Take 1 tablet (0.25 mg total) by mouth 4 (four) times daily as needed. ANXIETY/ SPASM Patient taking differently: Take 0.25 mg by mouth 4 (four) times daily as needed for anxiety.  07/15/12  Yes Matthew Babish, PA-C  amLODipine (NORVASC) 5 MG tablet Take 5 mg by mouth daily. 03/21/15  Yes Historical Provider, MD  fish oil-omega-3 fatty acids 1000 MG capsule Take 1 g by mouth daily.    Yes Historical Provider, MD  loratadine (CLARITIN) 10 MG tablet Take 10 mg by mouth daily as needed for allergies.    Yes Historical Provider, MD  OVER THE COUNTER MEDICATION Take 1 tablet by mouth 3 (three) times daily as needed (pain). Curamin   Yes Historical Provider, MD  traMADol (ULTRAM) 50 MG tablet Take 50-100 mg by mouth every 6 (six) hours as needed for moderate pain.  Every 6-8 hours as needed for pain 03/09/15  Yes Historical Provider, MD  Vitamin D, Ergocalciferol, (DRISDOL) 50000 UNITS CAPS Take 50,000 Units by mouth every 7 (seven) days. Friday   Yes Historical Provider, MD  VOLTAREN 1 % GEL Apply 2 g topically 3 (three) times daily as needed (pain). To right knee as needed for pain 12/26/14  Yes Historical Provider, MD    Family History Family History  Problem Relation Age of Onset  . Hypertension Sister   . Diabetes Sister   . Diabetes Brother   . Coronary artery disease Brother   . Deep vein thrombosis Sister   . Colon cancer Neg Hx   . Colon polyps Neg Hx     Social History Social History  Substance Use Topics  . Smoking status: Never Smoker  . Smokeless tobacco: Never Used  . Alcohol use No     Allergies   Patient has no known allergies.   Review of Systems Review of Systems  Constitutional:  Negative for fever.  Gastrointestinal: Positive for abdominal pain, nausea and vomiting.   Ten systems are reviewed and are negative for acute change except as noted in the HPI   Physical Exam Updated Vital Signs BP 186/99 (BP Location: Right Arm)   Pulse 75   Temp 98.5 F (36.9 C) (Oral)   Resp 17   SpO2 99%   Physical Exam  Constitutional: She is oriented to person, place, and time. She appears well-developed and well-nourished. No distress.  HENT:  Head: Normocephalic and atraumatic.  Nose: Nose normal.  Eyes: Conjunctivae and EOM are normal. Pupils are equal, round, and reactive to light. Right eye exhibits no discharge. Left eye exhibits no discharge. No scleral icterus.  Neck: Normal range of motion. Neck supple.  Cardiovascular: Normal rate and regular rhythm.  Exam reveals no gallop and no friction rub.   No murmur heard. Pulmonary/Chest: Effort normal and breath sounds normal. No stridor. No respiratory distress. She has no rales.  Abdominal: Soft. She exhibits distension. There is tenderness in the right lower quadrant, periumbilical area, suprapubic area and left lower quadrant. There is no rigidity, no rebound, no guarding and no CVA tenderness.  Musculoskeletal: She exhibits no edema or tenderness.  Neurological: She is alert and oriented to person, place, and time.  Skin: Skin is warm and dry. No rash noted. She is not diaphoretic. No erythema.  Psychiatric: She has a normal mood and affect.  Vitals reviewed.    ED Treatments / Results  Labs (all labs ordered are listed, but only abnormal results are displayed) Labs Reviewed  COMPREHENSIVE METABOLIC PANEL - Abnormal; Notable for the following:       Result Value   Chloride 100 (*)    Glucose, Bld 111 (*)    GFR calc non Af Amer 52 (*)    All other components within normal limits  LIPASE, BLOOD  CBC  URINALYSIS, ROUTINE W REFLEX MICROSCOPIC    EKG  EKG Interpretation None       Radiology Ct  Abdomen Pelvis W Contrast  Result Date: 02/26/2016 CLINICAL DATA:  Abdominal pain with vomiting beginning yesterday. History of small bowel obstruction. EXAM: CT ABDOMEN AND PELVIS WITH CONTRAST TECHNIQUE: Multidetector CT imaging of the abdomen and pelvis was performed using the standard protocol following bolus administration of intravenous contrast. CONTRAST:  170mL ISOVUE-300 IOPAMIDOL (ISOVUE-300) INJECTION 61% COMPARISON:  03/24/2015 FINDINGS: Lower chest: Chronic volume loss in the right lower lobe. Active right lower lobe inflammation not excluded. No effusions. Mild chronic scarring at the left base as well. Hepatobiliary: Chronic 8 mm cyst at the dome of the liver. No other liver finding. Gallbladder is distended but otherwise unremarkable. No calcified stones or visible inflammatory change. Pancreas: Normal.  Duodenum diverticulum. Spleen: Normal Adrenals/Urinary Tract: Mild adrenal hypertrophy. No focal lesion. Kidneys are normal without cyst, mass, stone or hydronephrosis. Stomach/Bowel: Dilated loops of small intestine consistent with small bowel obstruction. Caliber change in the mid ileum. Bowel sutures noted at the ileocecal region. Vascular/Lymphatic: Aortic atherosclerosis. No aneurysm. IVC is normal. No retroperitoneal mass or adenopathy. Reproductive: Negative Other: No free fluid or air. Musculoskeletal: Advanced chronic lower lumbar degenerative changes. IMPRESSION: Recurrent small bowel obstruction in the mid ileal region. Chronic volume loss at the right lung base. Element of active inflammation in that region not excluded. Aortic atherosclerosis. Electronically Signed   By: Nelson Chimes M.D.   On: 02/26/2016 09:48    Procedures Procedures (including critical care time)  Medications Ordered in ED Medications  iopamidol (ISOVUE-300) 61 % injection (not administered)  sodium chloride 0.9 % injection (not administered)  lidocaine (XYLOCAINE) 2 % jelly 1 application (not administered)   fentaNYL (SUBLIMAZE) injection 50 mcg (50 mcg Intravenous Given 02/26/16 0826)  ondansetron (ZOFRAN) injection 4 mg (4 mg Intravenous Given 02/26/16 0826)  sodium chloride 0.9 % bolus 1,000 mL (1,000 mLs Intravenous New Bag/Given 02/26/16 0825)  iopamidol (ISOVUE-300) 61 % injection 100 mL (100 mLs Intravenous Contrast Given 02/26/16 [id number])     Initial Impression / Assessment and Plan / ED Course  I have reviewed the triage vital signs and the nursing notes.  Pertinent labs & imaging results that were available during my care of the patient were reviewed by me and considered in my medical decision making (see chart for details).  Clinical  Course as of Feb 25 1029  Wed Feb 26, 2016  1003 CT confirming small bowel obstruction. Nasogastric tube inserted. We'll admit to hospitalist service for continued management. Consulted general surgery who will follow along.  [PC]    Clinical Course User Index [PC] Fatima Blank, MD      Final Clinical Impressions(s) / ED Diagnoses   Final diagnoses:  SBO (small bowel obstruction)      Fatima Blank, MD 02/26/16 1031

## 2016-02-27 DIAGNOSIS — I10 Essential (primary) hypertension: Secondary | ICD-10-CM

## 2016-02-27 DIAGNOSIS — K56609 Unspecified intestinal obstruction, unspecified as to partial versus complete obstruction: Secondary | ICD-10-CM

## 2016-02-27 DIAGNOSIS — F411 Generalized anxiety disorder: Secondary | ICD-10-CM

## 2016-02-27 LAB — COMPREHENSIVE METABOLIC PANEL
ALBUMIN: 3.1 g/dL — AB (ref 3.5–5.0)
ALT: 15 U/L (ref 14–54)
AST: 32 U/L (ref 15–41)
Alkaline Phosphatase: 38 U/L (ref 38–126)
Anion gap: 8 (ref 5–15)
BUN: 13 mg/dL (ref 6–20)
CHLORIDE: 109 mmol/L (ref 101–111)
CO2: 21 mmol/L — AB (ref 22–32)
CREATININE: 0.88 mg/dL (ref 0.44–1.00)
Calcium: 8.2 mg/dL — ABNORMAL LOW (ref 8.9–10.3)
GFR calc Af Amer: >60 mL/min (ref >60)
GFR calc non Af Amer: 58 mL/min — ABNORMAL LOW (ref >60)
GLUCOSE: 75 mg/dL (ref 65–99)
POTASSIUM: 4.4 mmol/L (ref 3.5–5.1)
SODIUM: 138 mmol/L (ref 135–145)
Total Bilirubin: 1 mg/dL (ref 0.3–1.2)
Total Protein: 6.1 g/dL — ABNORMAL LOW (ref 6.5–8.1)

## 2016-02-27 LAB — CBC
HEMATOCRIT: 29.9 % — AB (ref 36.0–46.0)
Hemoglobin: 9.7 g/dL — ABNORMAL LOW (ref 12.0–15.0)
MCH: 27.7 pg (ref 26.0–34.0)
MCHC: 32.4 g/dL (ref 30.0–36.0)
MCV: 85.4 fL (ref 78.0–100.0)
PLATELETS: 142 10*3/uL — AB (ref 150–400)
RBC: 3.5 MIL/uL — ABNORMAL LOW (ref 3.87–5.11)
RDW: 15.5 % (ref 11.5–15.5)
WBC: 4.8 10*3/uL (ref 4.0–10.5)

## 2016-02-27 MED ORDER — SALINE SPRAY 0.65 % NA SOLN
1.0000 | NASAL | Status: DC | PRN
  Filled 2016-02-27: qty 44

## 2016-02-27 MED ORDER — SODIUM CHLORIDE 0.9 % IV BOLUS (SEPSIS)
500.0000 mL | Freq: Once | INTRAVENOUS | Status: AC
  Administered 2016-02-27: 500 mL via INTRAVENOUS

## 2016-02-27 NOTE — Progress Notes (Signed)
Patient ID: [name] [name], female   DOB: April 17, 1928, 83 y.o.   MRN: [id number]  [facility] Surgery Progress Note     Subjective: Patient states that she is feeling better today. Abdomen sore but less painful than yesterday. She is passing flatus and had a good/soft BM this morning. Denies n/v.  Objective: Vital signs in last 24 hours: Temp:  [97.7 F (36.5 C)-98.4 F (36.9 C)] 97.7 F (36.5 C) (02/08 0601) Pulse Rate:  [60-79] 75 (02/08 0601) Resp:  [16-20] 16 (02/08 0601) BP: (142-165)/(60-79) 162/76 (02/08 0601) SpO2:  [95 %-99 %] 98 % (02/08 0601) Weight:  [165 lb (74.8 kg)] 165 lb (74.8 kg) (02/07 1708)    Intake/Output from previous day: 02/07 0701 - 02/08 0700 In: 1382.5 [I.V.:982.5; IV Piggyback:400] Out: 400 [Urine:350; Emesis/NG output:50] Intake/Output this shift: No intake/output data recorded.  PE: Gen:  Alert, NAD, pleasant Card:  RRR, no M/G/R heard Pulm:  CTAB, no W/R/R, effort normal Abd: Soft, mild distension, +BS, no HSM, mild lower abdominal tenderness Ext:  No erythema, edema, or tenderness   Lab Results:   Recent Labs  02/26/16 0819 02/27/16 0519  WBC 6.1 4.8  HGB 12.8 9.7*  HCT 39.0 29.9*  PLT 176 142*   BMET  Recent Labs  02/26/16 0819 02/27/16 0519  NA 135 138  K 4.4 4.4  CL 100* 109  CO2 24 21*  GLUCOSE 111* 75  BUN 15 13  CREATININE 0.97 0.88  CALCIUM 9.5 8.2*   PT/INR No results for input(s): LABPROT, INR in the last 72 hours. CMP     Component Value Date/Time   NA 138 02/27/2016 0519   K 4.4 02/27/2016 0519   CL 109 02/27/2016 0519   CO2 21 (L) 02/27/2016 0519   GLUCOSE 75 02/27/2016 0519   BUN 13 02/27/2016 0519   CREATININE 0.88 02/27/2016 0519   CALCIUM 8.2 (L) 02/27/2016 0519   PROT 6.1 (L) 02/27/2016 0519   ALBUMIN 3.1 (L) 02/27/2016 0519   AST 32 02/27/2016 0519   ALT 15 02/27/2016 0519   ALKPHOS 38 02/27/2016 0519   BILITOT 1.0 02/27/2016 0519   GFRNONAA 58 (L) 02/27/2016 0519   GFRAA >60  02/27/2016 0519   Lipase     Component Value Date/Time   LIPASE 24 02/26/2016 0819       Studies/Results: Dg Abdomen 1 View  Result Date: 02/26/2016 CLINICAL DATA:  NGT placement. EXAM: ABDOMEN - 1 VIEW COMPARISON:  CT of earlier today FINDINGS: Nasogastric tube placed in the interval. This terminates at the gastric body. Surgical sutures in the right-sided abdomen. Persistent small bowel obstruction pattern when compared to the prior scout exam. Contrast in the urinary bladder. IMPRESSION: Nasogastric tube terminating at the body of the stomach. Electronically Signed   By: Abigail Miyamoto M.D.   On: 02/26/2016 12:12   Ct Abdomen Pelvis W Contrast  Result Date: 02/26/2016 CLINICAL DATA:  Abdominal pain with vomiting beginning yesterday. History of small bowel obstruction. EXAM: CT ABDOMEN AND PELVIS WITH CONTRAST TECHNIQUE: Multidetector CT imaging of the abdomen and pelvis was performed using the standard protocol following bolus administration of intravenous contrast. CONTRAST:  124mL ISOVUE-300 IOPAMIDOL (ISOVUE-300) INJECTION 61% COMPARISON:  03/24/2015 FINDINGS: Lower chest: Chronic volume loss in the right lower lobe. Active right lower lobe inflammation not excluded. No effusions. Mild chronic scarring at the left base as well. Hepatobiliary: Chronic 8 mm cyst at the dome of the liver. No other liver finding. Gallbladder is distended but otherwise  unremarkable. No calcified stones or visible inflammatory change. Pancreas: Normal.  Duodenum diverticulum. Spleen: Normal Adrenals/Urinary Tract: Mild adrenal hypertrophy. No focal lesion. Kidneys are normal without cyst, mass, stone or hydronephrosis. Stomach/Bowel: Dilated loops of small intestine consistent with small bowel obstruction. Caliber change in the mid ileum. Bowel sutures noted at the ileocecal region. Vascular/Lymphatic: Aortic atherosclerosis. No aneurysm. IVC is normal. No retroperitoneal mass or adenopathy. Reproductive: Negative  Other: No free fluid or air. Musculoskeletal: Advanced chronic lower lumbar degenerative changes. IMPRESSION: Recurrent small bowel obstruction in the mid ileal region. Chronic volume loss at the right lung base. Element of active inflammation in that region not excluded. Aortic atherosclerosis. Electronically Signed   By: Nelson Chimes M.D.   On: 02/26/2016 09:48   Dg Abd Portable 1v-small Bowel Obstruction Protocol-initial, 8 Hr Delay  Result Date: 02/26/2016 CLINICAL DATA:  8 hour exam post contrast for small bowel obstruction. EXAM: PORTABLE ABDOMEN - 1 VIEW COMPARISON:  02/26/2016 KUB and CT FINDINGS: Oral contrast is seen within large bowel to the level of the rectum. Numerous descending and sigmoid colonic diverticula are identified. Gastric tube is seen in the expected location the stomach. No significant small bowel dilatation. No free air. Chain sutures are seen in the right lower quadrant. IMPRESSION: Enteric contrast reaches the rectum. No significant small bowel dilatation. Electronically Signed   By: Ashley Royalty M.D.   On: 02/26/2016 23:25    Anti-infectives: Anti-infectives    None       Assessment/Plan Recurrent SBO - Abdominal surgical history includes a colon resection/colostomy about 30-40 years ago for "hole in my bowel", colostomy reversal, tubal ligation, ?appendectomy - admission 3/25017 for SBO that resolved without surgical intervention - CT shows recurrent small bowel obstruction in the mid ileal region - XR showed contrast in rectum  HTN Multiple orthopedic surgeries, takes pain medication PRN but this is not daily (patient unsure which medication it is)  ID - none FEN - IVF, clear liquids  Plan - XR shows contrast in rectum and bowel function has returned. D/c NG and advance to clears. Please ambulate patient today.   LOS: 1 day    Jerrye Beavers , [facility] Surgery 02/27/2016, 8:23 AM Pager: [phone] Consults: [phone] Mon-Fri 7:00  am-4:30 pm Sat-Sun 7:00 am-11:30 am

## 2016-02-27 NOTE — Progress Notes (Signed)
[facility] PROGRESS NOTE  [name] [name] [id number] DOB: 19-Mar-1928 DOA: 02/26/2016 PCP: [name], MD  Interim summary and HPI 83 y.o. female with medical history significant of HTN presented to the ED with acute severe abdominal pain, since last night. Associated symptoms include nausea, vomiting and abdominal distension. Patient had previous hx of SBO in march 2017, treated conservatively. Patient denies recent surgery, constipation and/or weight loss.   Assessment/Plan: 1-abdominal pain/nausea/vomiting: secondary to recurrent SBO. -patient with drastic improvement to conservative measures -now with removed NGT -abd x-ray with contrast in the colon and patient has achieved BM overnight -will advance diet slowly as per [facility] recommendations -minimizing still a lot of PO meds -follow clinical response and tolerance -most likely home in the next 24-48 hours  2-HTN -stable -will continue PRN hydralazine for now  3-anxiety -stable -plan is to resume home PRN xanax   Code Status: Full code Family Communication: No family at bedside  Disposition Plan: anticipate discharge home in the next 24-48 hours base on patient symptoms improvement and capacity to tolerate PO's.   Consultants:  [facility]  Procedures:  See below for x-ray reports   Antibiotics:  None   HPI/Subjective: Afebrile, feeling much better and reporting no nausea or vomiting. Had BM's overnight. NGT removed this morning.  Objective: Vitals:   02/27/16 0601 02/27/16 1428  BP: (!) 162/76 (!) 167/77  Pulse: 75 80  Resp: 16 16  Temp: 97.7 F (36.5 C) 98.9 F (37.2 C)    Intake/Output Summary (Last 24 hours) at 02/27/16 1825 Last data filed at 02/27/16 1429  Gross per 24 hour  Intake             1300 ml  Output              900 ml  Net              400 ml   Filed Weights   02/26/16 1708  Weight: 74.8 kg (165 lb)    Exam:   General:  Afebrile, feeling much better and reports tolerating  CLD w/o problems. Patient had some BM's overnight and is not complaining of nausea or vomiting. NGT removed today  Cardiovascular: S1 and S2, no rubs or gallops  Respiratory: CTA bilaterally  Abdomen: soft, no distended, no tenderness, positive BS  Musculoskeletal: no edema, no cyanosis    Data Reviewed: Basic Metabolic Panel:  Recent Labs Lab 02/26/16 0819 02/27/16 0519  NA 135 138  K 4.4 4.4  CL 100* 109  CO2 24 21*  GLUCOSE 111* 75  BUN 15 13  CREATININE 0.97 0.88  CALCIUM 9.5 8.2*   Liver Function Tests:  Recent Labs Lab 02/26/16 0819 02/27/16 0519  AST 27 32  ALT 18 15  ALKPHOS 53 38  BILITOT 0.7 1.0  PROT 7.6 6.1*  ALBUMIN 4.0 3.1*    Recent Labs Lab 02/26/16 0819  LIPASE 24   CBC:  Recent Labs Lab 02/26/16 0819 02/27/16 0519  WBC 6.1 4.8  HGB 12.8 9.7*  HCT 39.0 29.9*  MCV 84.1 85.4  PLT 176 142*   Studies: Dg Abdomen 1 View  Result Date: 02/26/2016 CLINICAL DATA:  NGT placement. EXAM: ABDOMEN - 1 VIEW COMPARISON:  CT of earlier today FINDINGS: Nasogastric tube placed in the interval. This terminates at the gastric body. Surgical sutures in the right-sided abdomen. Persistent small bowel obstruction pattern when compared to the prior scout exam. Contrast in the urinary bladder. IMPRESSION: Nasogastric tube terminating at the body  of the stomach. Electronically Signed   By: Abigail Miyamoto M.D.   On: 02/26/2016 12:12   Ct Abdomen Pelvis W Contrast  Result Date: 02/26/2016 CLINICAL DATA:  Abdominal pain with vomiting beginning yesterday. History of small bowel obstruction. EXAM: CT ABDOMEN AND PELVIS WITH CONTRAST TECHNIQUE: Multidetector CT imaging of the abdomen and pelvis was performed using the standard protocol following bolus administration of intravenous contrast. CONTRAST:  147mL ISOVUE-300 IOPAMIDOL (ISOVUE-300) INJECTION 61% COMPARISON:  03/24/2015 FINDINGS: Lower chest: Chronic volume loss in the right lower lobe. Active right lower lobe  inflammation not excluded. No effusions. Mild chronic scarring at the left base as well. Hepatobiliary: Chronic 8 mm cyst at the dome of the liver. No other liver finding. Gallbladder is distended but otherwise unremarkable. No calcified stones or visible inflammatory change. Pancreas: Normal.  Duodenum diverticulum. Spleen: Normal Adrenals/Urinary Tract: Mild adrenal hypertrophy. No focal lesion. Kidneys are normal without cyst, mass, stone or hydronephrosis. Stomach/Bowel: Dilated loops of small intestine consistent with small bowel obstruction. Caliber change in the mid ileum. Bowel sutures noted at the ileocecal region. Vascular/Lymphatic: Aortic atherosclerosis. No aneurysm. IVC is normal. No retroperitoneal mass or adenopathy. Reproductive: Negative Other: No free fluid or air. Musculoskeletal: Advanced chronic lower lumbar degenerative changes. IMPRESSION: Recurrent small bowel obstruction in the mid ileal region. Chronic volume loss at the right lung base. Element of active inflammation in that region not excluded. Aortic atherosclerosis. Electronically Signed   By: Nelson Chimes M.D.   On: 02/26/2016 09:48   Dg Abd Portable 1v-small Bowel Obstruction Protocol-initial, 8 Hr Delay  Result Date: 02/26/2016 CLINICAL DATA:  8 hour exam post contrast for small bowel obstruction. EXAM: PORTABLE ABDOMEN - 1 VIEW COMPARISON:  02/26/2016 KUB and CT FINDINGS: Oral contrast is seen within large bowel to the level of the rectum. Numerous descending and sigmoid colonic diverticula are identified. Gastric tube is seen in the expected location the stomach. No significant small bowel dilatation. No free air. Chain sutures are seen in the right lower quadrant. IMPRESSION: Enteric contrast reaches the rectum. No significant small bowel dilatation. Electronically Signed   By: Ashley Royalty M.D.   On: 02/26/2016 23:25    Scheduled Meds: . enoxaparin (LOVENOX) injection  40 mg Subcutaneous Q24H  . lidocaine  1 application  Topical Once   Continuous Infusions: . sodium chloride 75 mL/hr at 02/27/16 [id number]    Active Problems:   Essential hypertension   SBO (small bowel obstruction)   Generalized anxiety disorder    Time spent: 25 minutes    Barton Dubois  [facility] Pager [phone]. If 7PM-7AM, please contact night-coverage at [email or web address], password [facility] 02/27/2016, 6:25 PM  LOS: 1 day

## 2016-02-28 DIAGNOSIS — K219 Gastro-esophageal reflux disease without esophagitis: Secondary | ICD-10-CM

## 2016-02-28 LAB — BASIC METABOLIC PANEL
Anion gap: 5 (ref 5–15)
BUN: 8 mg/dL (ref 6–20)
CALCIUM: 8.4 mg/dL — AB (ref 8.9–10.3)
CO2: 24 mmol/L (ref 22–32)
CREATININE: 0.77 mg/dL (ref 0.44–1.00)
Chloride: 111 mmol/L (ref 101–111)
GFR calc Af Amer: >60 mL/min (ref >60)
GFR calc non Af Amer: >60 mL/min (ref >60)
Glucose, Bld: 96 mg/dL (ref 65–99)
Potassium: 3.8 mmol/L (ref 3.5–5.1)
Sodium: 140 mmol/L (ref 135–145)

## 2016-02-28 LAB — MAGNESIUM: Magnesium: 1.7 mg/dL (ref 1.7–2.4)

## 2016-02-28 MED ORDER — POLYETHYLENE GLYCOL 3350 17 G PO PACK: 17.0000 g | PACK | Freq: Every day | ORAL | 1 refills | Status: AC | PRN

## 2016-02-28 MED ORDER — TRAMADOL HCL 50 MG PO TABS: 50.0000 mg | ORAL_TABLET | Freq: Four times a day (QID) | ORAL | Status: DC | PRN

## 2016-02-28 NOTE — Progress Notes (Signed)
Patient ID: [name] [name], female   DOB: [birth year], 83 y.o.   MRN: [id number]  [facility] Surgery Progress Note     Subjective: Patient feeling great this morning. Denies any abdominal pain. Tolerated full liquids for breakfast. She has ambulated several times. She is passing flatus and had a few loose BM's. Denies n/v or abdominal bloating.  Objective: Vital signs in last 24 hours: Temp:  [98 F (36.7 C)-98.9 F (37.2 C)] 98 F (36.7 C) (02/09 0439) Pulse Rate:  [73-81] 73 (02/09 0439) Resp:  [15-16] 16 (02/09 0439) BP: (166-167)/(74-83) 166/74 (02/09 0439) SpO2:  [99 %-100 %] 99 % (02/09 0439) Last BM Date: 02/27/16  Intake/Output from previous day: 02/08 0701 - 02/09 0700 In: 1725 [I.V.:1725] Out: 900 [Urine:900] Intake/Output this shift: Total I/O In: 400 [P.O.:400] Out: -   PE: Gen:  Alert, NAD, pleasant Card:  RRR, no M/G/R heard Pulm:  CTAB, no W/R/R, effort normal Abd: Soft, minimal distension, +BS, nontender Ext:  No erythema, edema, or tenderness   Lab Results:   Recent Labs  02/26/16 0819 02/27/16 0519  WBC 6.1 4.8  HGB 12.8 9.7*  HCT 39.0 29.9*  PLT 176 142*   BMET  Recent Labs  02/27/16 0519 02/28/16 0518  NA 138 140  K 4.4 3.8  CL 109 111  CO2 21* 24  GLUCOSE 75 96  BUN 13 8  CREATININE 0.88 0.77  CALCIUM 8.2* 8.4*   PT/INR No results for input(s): LABPROT, INR in the last 72 hours. CMP     Component Value Date/Time   NA 140 02/28/2016 0518   K 3.8 02/28/2016 0518   CL 111 02/28/2016 0518   CO2 24 02/28/2016 0518   GLUCOSE 96 02/28/2016 0518   BUN 8 02/28/2016 0518   CREATININE 0.77 02/28/2016 0518   CALCIUM 8.4 (L) 02/28/2016 0518   PROT 6.1 (L) 02/27/2016 0519   ALBUMIN 3.1 (L) 02/27/2016 0519   AST 32 02/27/2016 0519   ALT 15 02/27/2016 0519   ALKPHOS 38 02/27/2016 0519   BILITOT 1.0 02/27/2016 0519   GFRNONAA >60 02/28/2016 0518   GFRAA >60 02/28/2016 0518   Lipase     Component Value Date/Time   LIPASE  24 02/26/2016 0819       Studies/Results: Dg Abdomen 1 View  Result Date: 02/26/2016 CLINICAL DATA:  NGT placement. EXAM: ABDOMEN - 1 VIEW COMPARISON:  CT of earlier today FINDINGS: Nasogastric tube placed in the interval. This terminates at the gastric body. Surgical sutures in the right-sided abdomen. Persistent small bowel obstruction pattern when compared to the prior scout exam. Contrast in the urinary bladder. IMPRESSION: Nasogastric tube terminating at the body of the stomach. Electronically Signed   By: Abigail Miyamoto M.D.   On: 02/26/2016 12:12   Ct Abdomen Pelvis W Contrast  Result Date: 02/26/2016 CLINICAL DATA:  Abdominal pain with vomiting beginning yesterday. History of small bowel obstruction. EXAM: CT ABDOMEN AND PELVIS WITH CONTRAST TECHNIQUE: Multidetector CT imaging of the abdomen and pelvis was performed using the standard protocol following bolus administration of intravenous contrast. CONTRAST:  138mL ISOVUE-300 IOPAMIDOL (ISOVUE-300) INJECTION 61% COMPARISON:  03/24/2015 FINDINGS: Lower chest: Chronic volume loss in the right lower lobe. Active right lower lobe inflammation not excluded. No effusions. Mild chronic scarring at the left base as well. Hepatobiliary: Chronic 8 mm cyst at the dome of the liver. No other liver finding. Gallbladder is distended but otherwise unremarkable. No calcified stones or visible inflammatory change. Pancreas: Normal.  Duodenum diverticulum. Spleen: Normal Adrenals/Urinary Tract: Mild adrenal hypertrophy. No focal lesion. Kidneys are normal without cyst, mass, stone or hydronephrosis. Stomach/Bowel: Dilated loops of small intestine consistent with small bowel obstruction. Caliber change in the mid ileum. Bowel sutures noted at the ileocecal region. Vascular/Lymphatic: Aortic atherosclerosis. No aneurysm. IVC is normal. No retroperitoneal mass or adenopathy. Reproductive: Negative Other: No free fluid or air. Musculoskeletal: Advanced chronic lower  lumbar degenerative changes. IMPRESSION: Recurrent small bowel obstruction in the mid ileal region. Chronic volume loss at the right lung base. Element of active inflammation in that region not excluded. Aortic atherosclerosis. Electronically Signed   By: Nelson Chimes M.D.   On: 02/26/2016 09:48   Dg Abd Portable 1v-small Bowel Obstruction Protocol-initial, 8 Hr Delay  Result Date: 02/26/2016 CLINICAL DATA:  8 hour exam post contrast for small bowel obstruction. EXAM: PORTABLE ABDOMEN - 1 VIEW COMPARISON:  02/26/2016 KUB and CT FINDINGS: Oral contrast is seen within large bowel to the level of the rectum. Numerous descending and sigmoid colonic diverticula are identified. Gastric tube is seen in the expected location the stomach. No significant small bowel dilatation. No free air. Chain sutures are seen in the right lower quadrant. IMPRESSION: Enteric contrast reaches the rectum. No significant small bowel dilatation. Electronically Signed   By: Ashley Royalty M.D.   On: 02/26/2016 23:25    Anti-infectives: Anti-infectives    None       Assessment/Plan Recurrent SBO - Abdominal surgical history includes a colon resection/colostomyabout 30-40years ago for "hole in my bowel", colostomy reversal, tubal ligation, ?appendectomy - admission 3/25017 for SBO that resolved without surgical intervention - CT shows recurrent small bowel obstruction in the mid ileal region - XR 2/8 showed contrast in rectum - NG d/c 2/8  HTN Multiple orthopedic surgeries, takes pain medication PRN but this is not daily (patient unsure which medication it is)  ID - none FEN -soft diet  Plan - Advance to soft diet. Encourage patient to continue ambulating. From a surgical standpoint patient will be ready for discharge if she tolerates soft diet. We will sign off, but please call with concerns.   LOS: 2 days    Jerrye Beavers , [facility] Surgery 02/28/2016, 8:56 AM Pager: [phone] Consults:  [phone] Mon-Fri 7:00 am-4:30 pm Sat-Sun 7:00 am-11:30 am

## 2016-02-28 NOTE — Discharge Summary (Signed)
Physician Discharge Summary  [name] [name] [id number] DOB: 1928/07/08 DOA: 02/26/2016  PCP: [name], MD  Admit date: 02/26/2016 Discharge date: 02/28/2016  Time spent: 35 minutes  Recommendations for Outpatient Follow-up:  1. Repeat BMET to follow electrolytes and renal function    Discharge Diagnoses:  Active Problems:   Essential hypertension   SBO (small bowel obstruction)   Generalized anxiety disorder GERD  Discharge Condition: stable and improved. Discharge home with instructions to follow up with PCP in 2 weeks.  Diet recommendation: soft-heart healthy diet   Filed Weights   02/26/16 1708  Weight: 74.8 kg (165 lb)    History of present illness:  83 y.o.femalewith medical history significant of HTN presented to the ED with acute severe abdominal pain, since last night. Associated symptoms include nausea, vomiting and abdominal distension. Patient had previous hx of SBO in march 2017, treated conservatively. Patient denies recent surgery, constipation and/or weight loss.   Hospital Course:  1-abdominal pain/nausea/vomiting: secondary to recurrent SBO. -patient with drastic improvement to conservative measures -now with removed NGT -abd x-ray with contrast in the colon and patient has achieved BM overnight -appreciate assistance and guidance for general surgery service  -tolerating soft diet and PO meds. Will discharge home -patient advise to follow soft diet, rich in fiber and to use miralax as needed for mild constipation. -instructed to follow up with PCP in 10-14 days  2-HTN -stable -will resume home antihypertensive regimen -patient advise to follow heart healthy diet   3-anxiety -stable -plan is to resume home PRN xanax   4-GERD -mild intermittent symptoms -advise to use PRN Tums or Zantac OTC.  Procedures:  See below for x-ray reports   Consultations:  General surgery   Discharge Exam: Vitals:   02/27/16 2210 02/28/16 0439  BP: (!)  166/83 (!) 166/74  Pulse: 81 73  Resp: 15 16  Temp: 98.8 F (37.1 C) 98 F (36.7 C)    General:  Afebrile, feeling much better and reports tolerating soft diet and continue to move Bowels. Denies nausea or vomiting and also abd pain.  Cardiovascular: S1 and S2, no rubs or gallops  Respiratory: CTA bilaterally  Abdomen: soft, no distended, no tenderness, positive BS  Musculoskeletal: no edema, no cyanosis     Discharge Instructions   Discharge Instructions    Diet - low sodium heart healthy    Complete by:  As directed    Discharge instructions    Complete by:  As directed    Keep yourself well hydrated Take medications as prescribed Use miralax PRN for mild constipation and ok to use PRM TUMS (OTC) for reflux symptoms  Follow up with PCP in 10-14 days  Follow soft diet rich in fiber     Current Discharge Medication List    START taking these medications   Details  polyethylene glycol (MIRALAX) packet Take 17 g by mouth daily as needed for mild constipation. Qty: 30 each, Refills: 1      CONTINUE these medications which have CHANGED   Details  traMADol (ULTRAM) 50 MG tablet Take 1 tablet (50 mg total) by mouth every 6 (six) hours as needed for moderate pain.  Every 6-8 hours as needed for pain      CONTINUE these medications which have NOT CHANGED   Details  acetaminophen (TYLENOL) 500 MG tablet Take 1,000 mg by mouth every 6 (six) hours as needed for moderate pain.    ALPRAZolam (XANAX) 0.25 MG tablet Take 1 tablet (0.25 mg total)  by mouth 4 (four) times daily as needed. ANXIETY/ SPASM Qty: 30 tablet, Refills: 0    amLODipine (NORVASC) 5 MG tablet Take 5 mg by mouth daily.    fish oil-omega-3 fatty acids 1000 MG capsule Take 1 g by mouth daily.     loratadine (CLARITIN) 10 MG tablet Take 10 mg by mouth daily as needed for allergies.     Vitamin D, Ergocalciferol, (DRISDOL) 50000 UNITS CAPS Take 50,000 Units by mouth every 7 (seven) days. Friday     VOLTAREN 1 % GEL Apply 2 g topically 3 (three) times daily as needed (pain). To right knee as needed for pain Refills: 1      STOP taking these medications     OVER THE COUNTER MEDICATION        No Known Allergies Follow-up Information    [name], MD. Schedule an appointment as soon as possible for a visit in 2 week(s).   Specialty:  Internal Medicine Contact information: [address] [place] [id number] [phone]           The results of significant diagnostics from this hospitalization (including imaging, microbiology, ancillary and laboratory) are listed below for reference.    Significant Diagnostic Studies: Dg Abdomen 1 View  Result Date: 02/26/2016 CLINICAL DATA:  NGT placement. EXAM: ABDOMEN - 1 VIEW COMPARISON:  CT of earlier today FINDINGS: Nasogastric tube placed in the interval. This terminates at the gastric body. Surgical sutures in the right-sided abdomen. Persistent small bowel obstruction pattern when compared to the prior scout exam. Contrast in the urinary bladder. IMPRESSION: Nasogastric tube terminating at the body of the stomach. Electronically Signed   By: Abigail Miyamoto M.D.   On: 02/26/2016 12:12   Ct Abdomen Pelvis W Contrast  Result Date: 02/26/2016 CLINICAL DATA:  Abdominal pain with vomiting beginning yesterday. History of small bowel obstruction. EXAM: CT ABDOMEN AND PELVIS WITH CONTRAST TECHNIQUE: Multidetector CT imaging of the abdomen and pelvis was performed using the standard protocol following bolus administration of intravenous contrast. CONTRAST:  159mL ISOVUE-300 IOPAMIDOL (ISOVUE-300) INJECTION 61% COMPARISON:  03/24/2015 FINDINGS: Lower chest: Chronic volume loss in the right lower lobe. Active right lower lobe inflammation not excluded. No effusions. Mild chronic scarring at the left base as well. Hepatobiliary: Chronic 8 mm cyst at the dome of the liver. No other liver finding. Gallbladder is distended but otherwise unremarkable.  No calcified stones or visible inflammatory change. Pancreas: Normal.  Duodenum diverticulum. Spleen: Normal Adrenals/Urinary Tract: Mild adrenal hypertrophy. No focal lesion. Kidneys are normal without cyst, mass, stone or hydronephrosis. Stomach/Bowel: Dilated loops of small intestine consistent with small bowel obstruction. Caliber change in the mid ileum. Bowel sutures noted at the ileocecal region. Vascular/Lymphatic: Aortic atherosclerosis. No aneurysm. IVC is normal. No retroperitoneal mass or adenopathy. Reproductive: Negative Other: No free fluid or air. Musculoskeletal: Advanced chronic lower lumbar degenerative changes. IMPRESSION: Recurrent small bowel obstruction in the mid ileal region. Chronic volume loss at the right lung base. Element of active inflammation in that region not excluded. Aortic atherosclerosis. Electronically Signed   By: Nelson Chimes M.D.   On: 02/26/2016 09:48   Dg Abd Portable 1v-small Bowel Obstruction Protocol-initial, 8 Hr Delay  Result Date: 02/26/2016 CLINICAL DATA:  8 hour exam post contrast for small bowel obstruction. EXAM: PORTABLE ABDOMEN - 1 VIEW COMPARISON:  02/26/2016 KUB and CT FINDINGS: Oral contrast is seen within large bowel to the level of the rectum. Numerous descending and sigmoid colonic diverticula are identified. Gastric tube  is seen in the expected location the stomach. No significant small bowel dilatation. No free air. Chain sutures are seen in the right lower quadrant. IMPRESSION: Enteric contrast reaches the rectum. No significant small bowel dilatation. Electronically Signed   By: Ashley Royalty M.D.   On: 02/26/2016 23:25    Labs: Basic Metabolic Panel:  Recent Labs Lab 02/26/16 0819 02/27/16 0519 02/28/16 0518  NA 135 138 140  K 4.4 4.4 3.8  CL 100* 109 111  CO2 24 21* 24  GLUCOSE 111* 75 96  BUN 15 13 8   CREATININE 0.97 0.88 0.77  CALCIUM 9.5 8.2* 8.4*  MG  --   --  1.7   Liver Function Tests:  Recent Labs Lab 02/26/16 0819  02/27/16 0519  AST 27 32  ALT 18 15  ALKPHOS 53 38  BILITOT 0.7 1.0  PROT 7.6 6.1*  ALBUMIN 4.0 3.1*    Recent Labs Lab 02/26/16 0819  LIPASE 24   CBC:  Recent Labs Lab 02/26/16 0819 02/27/16 0519  WBC 6.1 4.8  HGB 12.8 9.7*  HCT 39.0 29.9*  MCV 84.1 85.4  PLT 176 142*    Signed:  Barton Dubois MD.  [facility] 02/28/2016, 2:08 PM

## 2016-06-12 ENCOUNTER — Encounter (HOSPITAL_COMMUNITY): Payer: Self-pay | Admitting: Emergency Medicine

## 2016-06-12 ENCOUNTER — Inpatient Hospital Stay (HOSPITAL_COMMUNITY)
Admission: EM | Admit: 2016-06-12 | Discharge: 2016-06-19 | DRG: 389 | Disposition: A | Discharge status: Home / Self Care | Payer: Medicare Other | Attending: Family Medicine | Admitting: Family Medicine

## 2016-06-12 DIAGNOSIS — Z96651 Presence of right artificial knee joint: Secondary | ICD-10-CM | POA: Diagnosis present

## 2016-06-12 DIAGNOSIS — R1084 Generalized abdominal pain: Secondary | ICD-10-CM | POA: Diagnosis present

## 2016-06-12 DIAGNOSIS — K529 Noninfective gastroenteritis and colitis, unspecified: Secondary | ICD-10-CM | POA: Diagnosis present

## 2016-06-12 DIAGNOSIS — R109 Unspecified abdominal pain: Secondary | ICD-10-CM

## 2016-06-12 DIAGNOSIS — K5651 Intestinal adhesions [bands], with partial obstruction: Principal | ICD-10-CM | POA: Diagnosis present

## 2016-06-12 DIAGNOSIS — N179 Acute kidney failure, unspecified: Secondary | ICD-10-CM

## 2016-06-12 DIAGNOSIS — E872 Acidosis: Secondary | ICD-10-CM | POA: Diagnosis present

## 2016-06-12 DIAGNOSIS — F411 Generalized anxiety disorder: Secondary | ICD-10-CM | POA: Diagnosis present

## 2016-06-12 DIAGNOSIS — K219 Gastro-esophageal reflux disease without esophagitis: Secondary | ICD-10-CM | POA: Diagnosis present

## 2016-06-12 DIAGNOSIS — K56609 Unspecified intestinal obstruction, unspecified as to partial versus complete obstruction: Secondary | ICD-10-CM

## 2016-06-12 DIAGNOSIS — I10 Essential (primary) hypertension: Secondary | ICD-10-CM | POA: Diagnosis present

## 2016-06-12 DIAGNOSIS — K567 Ileus, unspecified: Secondary | ICD-10-CM

## 2016-06-12 DIAGNOSIS — R112 Nausea with vomiting, unspecified: Secondary | ICD-10-CM | POA: Diagnosis present

## 2016-06-12 DIAGNOSIS — Z79899 Other long term (current) drug therapy: Secondary | ICD-10-CM

## 2016-06-12 DIAGNOSIS — E876 Hypokalemia: Secondary | ICD-10-CM | POA: Diagnosis present

## 2016-06-12 DIAGNOSIS — Z0189 Encounter for other specified special examinations: Secondary | ICD-10-CM

## 2016-06-12 DIAGNOSIS — R0602 Shortness of breath: Secondary | ICD-10-CM

## 2016-06-12 LAB — COMPREHENSIVE METABOLIC PANEL
ALK PHOS: 52 U/L (ref 38–126)
ALT: 28 U/L (ref 14–54)
ANION GAP: 10 (ref 5–15)
AST: 34 U/L (ref 15–41)
Albumin: 3.9 g/dL (ref 3.5–5.0)
BILIRUBIN TOTAL: 0.4 mg/dL (ref 0.3–1.2)
BUN: 14 mg/dL (ref 6–20)
CO2: 25 mmol/L (ref 22–32)
Calcium: 9.5 mg/dL (ref 8.9–10.3)
Chloride: 101 mmol/L (ref 101–111)
Creatinine, Ser: 1.04 mg/dL — ABNORMAL HIGH (ref 0.44–1.00)
GFR, EST AFRICAN AMERICAN: 55 mL/min — AB (ref >60)
GFR, EST NON AFRICAN AMERICAN: 48 mL/min — AB (ref >60)
Glucose, Bld: 153 mg/dL — ABNORMAL HIGH (ref 65–99)
Potassium: 4.5 mmol/L (ref 3.5–5.1)
Sodium: 136 mmol/L (ref 135–145)
TOTAL PROTEIN: 7.8 g/dL (ref 6.5–8.1)

## 2016-06-12 LAB — CBC WITH DIFFERENTIAL/PLATELET
BASOS ABS: 0 10*3/uL (ref 0.0–0.1)
BASOS PCT: 0 %
Eosinophils Absolute: 0.1 10*3/uL (ref 0.0–0.7)
Eosinophils Relative: 1 %
HEMATOCRIT: 37.9 % (ref 36.0–46.0)
HEMOGLOBIN: 12.3 g/dL (ref 12.0–15.0)
Lymphocytes Relative: 13 %
Lymphs Abs: 1.1 10*3/uL (ref 0.7–4.0)
MCH: 27.4 pg (ref 26.0–34.0)
MCHC: 32.5 g/dL (ref 30.0–36.0)
MCV: 84.4 fL (ref 78.0–100.0)
MONO ABS: 0.3 10*3/uL (ref 0.1–1.0)
Monocytes Relative: 3 %
NEUTROS ABS: 7.6 10*3/uL (ref 1.7–7.7)
NEUTROS PCT: 83 %
Platelets: 231 10*3/uL (ref 150–400)
RBC: 4.49 MIL/uL (ref 3.87–5.11)
RDW: 14.8 % (ref 11.5–15.5)
WBC: 9.1 10*3/uL (ref 4.0–10.5)

## 2016-06-12 LAB — LIPASE, BLOOD: Lipase: 39 U/L (ref 11–51)

## 2016-06-12 MED ORDER — ONDANSETRON 8 MG PO TBDP
8.0000 mg | ORAL_TABLET | Freq: Once | ORAL | Status: AC
  Administered 2016-06-12: 8 mg via ORAL
  Filled 2016-06-12: qty 1

## 2016-06-12 NOTE — ED Triage Notes (Addendum)
Patient is complaining of abdominal pain. Patient states it started around 4 pm today. Patient ate [employer] and some spinach. Patient states that she has been having bowel movements. Patient has been vomiting also.

## 2016-06-12 NOTE — ED Provider Notes (Signed)
[facility] DEPT Provider Note   CSN: [id number] Arrival date & time: 06/12/16  2142   By signing my name below, I, Neta Mends, attest that this documentation has been prepared under the direction and in the presence of [employer], [place]. Electronically Signed: Neta Mends, ED Scribe. 06/13/2016. 12:09 AM.   History   Chief Complaint Chief Complaint  Patient presents with  . Abdominal Pain  . Emesis    The history is provided by the patient. No language interpreter was used.   HPI Comments:  [name] [name] is a 83 y.o. female who presents to the Emergency Department complaining of ongoing abdominal pain that began at 1730 today. Pt reports that she at [facility] and spinach at 1600 today, and then began having abdominal pain 90 minutes later. Pt complains of associated frequent BMs since onset, nausea, and vomiting x 4. She reports hx of multiple small bowel obstructions. No alleviating factors noted. Denies fever, chills, melena, hematochezia, hematemesis. No medications taken PTA.    Past Medical History:  Diagnosis Date  . Allergy    SEASONAL  . Anemia   . Anxiety   . Arthritis    KNEES/HANDS  . 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 Blind loop syndrome   . Complication of anesthesia    pt can't remember exact complication, but she remembers about 30 years ago she may have had trouble waking up from anesthesia.  . Fatty liver   . GERD (gastroesophageal reflux disease)   . Hiatal hernia   . Hypertension   . MVP (mitral valve prolapse)   . PVC's (premature ventricular contractions)   . Schatzki's ring   . Small bowel obstruction ([place])   . Vitamin B12 deficiency     Patient Active Problem List   Diagnosis Date Noted  . Generalized anxiety disorder 02/26/2016  . Aspiration pneumonia of right lower lobe ([place])   . Small bowel obstruction ([place])   . RLL pneumonia ([place]) 03/26/2015  . SBO (small bowel obstruction) ([place]) 03/24/2015  . S/P left  TKA 11/13/2014  . S/P knee replacement 11/13/2014  . Overweight (BMI 25.0-29.9) 07/15/2012  . Expected blood loss anemia 07/15/2012  . S/P right TK revision 07/14/2012  . Ankylosis of knee joint 04/03/2011  . Fibrosis of knee joint 01/22/2011  . Chest pain 10/20/2010  . Bacterial overgrowth syndrome 10/09/2010  . Status post right hemicolectomy 10/09/2010  . Esophageal dysphagia 10/09/2010  . B12 deficiency 10/09/2010  . DYSPHAGIA UNSPECIFIED 01/23/2010  . BLIND LOOP SYNDROME 08/26/2009  . GASTRITIS 12/26/2008  . WEIGHT LOSS-ABNORMAL 12/25/2008  . DYSPHAGIA 12/25/2008  . DIARRHEA 07/17/2008  . HOARSENESS 07/26/2007  . B12 DEFICIENCY 07/25/2007  . Essential hypertension 07/25/2007  . MITRAL VALVE PROLAPSE 07/25/2007  . PREMATURE VENTRICULAR CONTRACTIONS 07/25/2007  . ESOPHAGITIS, REFLUX 07/25/2007  . Gastroesophageal reflux disease 07/25/2007  . GASTRITIS, CHRONIC 07/25/2007  . HIATAL HERNIA 07/25/2007  . ABSCESS OF INTESTINE 07/25/2007    Past Surgical History:  Procedure Laterality Date  . CATARACT EXTRACTION, BILATERAL  03-26-11  . HEMICOLECTOMY     and ileectomy Dr Vida Rigger; due to perforated viscus  . I&D KNEE WITH POLY EXCHANGE  04/03/2011   Procedure: IRRIGATION AND DEBRIDEMENT KNEE WITH POLY EXCHANGE;  Surgeon: Mcarthur Rossetti, MD;  Location: [facility];  Service: Orthopedics;  Laterality: Right;  . KNEE CLOSED REDUCTION  01/22/2011   Procedure: CLOSED MANIPULATION KNEE;  Surgeon: Mcarthur Rossetti;  Location: [facility];  Service: Orthopedics;  Laterality:  Right;  Manipulation under anesthesia right knee  . Nuclear stress test  Sept 2012   Normal  . REPLACEMENT TOTAL KNEE     right  . ROTATOR CUFF REPAIR  03-26-11   right arm  . SYNOVECTOMY  04/03/2011   Procedure: SYNOVECTOMY;  Surgeon: Mcarthur Rossetti, MD;  Location: [facility];  Service: Orthopedics;  Laterality: Right;  . TOTAL KNEE ARTHROPLASTY Left 11/13/2014   Procedure: TOTAL KNEE ARTHROPLASTY;  Surgeon:  Paralee Cancel, MD;  Location: [facility];  Service: Orthopedics;  Laterality: Left;  . TOTAL KNEE REVISION Right 07/14/2012   Procedure: RIGHT TOTAL KNEE REVISION;  Surgeon: Mauri Pole, MD;  Location: [facility];  Service: Orthopedics;  Laterality: Right;  . TUBAL LIGATION      OB History    No data available       Home Medications    Prior to Admission medications   Medication Sig Start Date End Date Taking? Authorizing Provider  acetaminophen (TYLENOL) 500 MG tablet Take 1,000 mg by mouth every 6 (six) hours as needed for moderate pain.   Yes [provider]  ALPRAZolam (XANAX) 0.25 MG tablet Take 1 tablet (0.25 mg total) by mouth 4 (four) times daily as needed. ANXIETY/ SPASM Patient taking differently: Take 0.25 mg by mouth 4 (four) times daily as needed for anxiety.  07/15/12  Yes Babish, Rodman Key, PA-C  amLODipine (NORVASC) 5 MG tablet Take 5 mg by mouth every morning.  03/21/15  Yes [provider]  fish oil-omega-3 fatty acids 1000 MG capsule Take 1 g by mouth every morning.    Yes [provider]  loratadine (CLARITIN) 10 MG tablet Take 10 mg by mouth daily as needed for allergies.    Yes [provider]  polyethylene glycol (MIRALAX) packet Take 17 g by mouth daily as needed for mild constipation. 02/28/16  Yes Barton Dubois, MD  traMADol (ULTRAM) 50 MG tablet Take 1 tablet (50 mg total) by mouth every 6 (six) hours as needed for moderate pain.  Every 6-8 hours as needed for pain 02/28/16  Yes Barton Dubois, MD  Vitamin D, Ergocalciferol, (DRISDOL) 50000 UNITS CAPS Take 50,000 Units by mouth every 7 (seven) days. Friday   Yes [provider]  VOLTAREN 1 % GEL Apply 2 g topically 3 (three) times daily as needed (pain). To right knee as needed for pain 12/26/14  Yes [provider]    Family History Family History  Problem Relation Age of Onset  . Hypertension Sister   . Diabetes Sister   . Diabetes Brother   . Coronary artery disease  Brother   . Deep vein thrombosis Sister   . Colon cancer Neg Hx   . Colon polyps Neg Hx     Social History Social History  Substance Use Topics  . Smoking status: Never Smoker  . Smokeless tobacco: Never Used  . Alcohol use No     Allergies   Patient has no known allergies.   Review of Systems Review of Systems  All systems reviewed and are negative for acute change except as noted in the HPI.   Physical Exam Updated Vital Signs BP (!) 160/75 (BP Location: Right Arm)   Pulse 82   Temp 98.2 F (36.8 C) (Oral)   Resp 18   Ht 5\' 5"  (1.651 m)   Wt 77.1 kg (170 lb)   SpO2 100%   BMI 28.29 kg/m   Physical Exam  Constitutional: She is oriented to person, place,  and time. She appears well-developed and well-nourished. No distress.  Nontoxic appearing and in no acute distress.  HENT:  Head: Normocephalic and atraumatic.  Eyes: Conjunctivae and EOM are normal. No scleral icterus.  Neck: Normal range of motion.  Cardiovascular: Normal rate.   Pulmonary/Chest: Effort normal. No respiratory distress. She has no wheezes. She has no rales.  Respirations even and unlabored. Lungs clear to auscultation bilaterally.  Abdominal: Soft. She exhibits distension. She exhibits no mass. There is tenderness. There is no rebound.  Mildly distended abdomen with diffuse tenderness to palpation, though this is notably worse in the bilateral lower quadrants. No peritoneal signs or masses palpated.  Musculoskeletal: Normal range of motion.  Neurological: She is alert and oriented to person, place, and time. She exhibits normal muscle tone. Coordination normal.  Skin: Skin is warm and dry. No rash noted. She is not diaphoretic. No erythema. No pallor.  Psychiatric: She has a normal mood and affect. Her behavior is normal.  Nursing note and vitals reviewed.    ED Treatments / Results  DIAGNOSTIC STUDIES:  Oxygen Saturation is 98% on RA, normal by my interpretation.    COORDINATION OF  CARE:  12:07 AM Discussed treatment plan with pt at bedside and pt agreed to plan.   Labs (all labs ordered are listed, but only abnormal results are displayed) Labs Reviewed  COMPREHENSIVE METABOLIC PANEL - Abnormal; Notable for the following:       Result Value   Glucose, Bld 153 (*)    Creatinine, Ser 1.04 (*)    GFR calc non Af Amer 48 (*)    GFR calc Af Amer 55 (*)    All other components within normal limits  URINALYSIS, ROUTINE W REFLEX MICROSCOPIC - Abnormal; Notable for the following:    Protein, ur 100 (*)    Leukocytes, UA TRACE (*)    Squamous Epithelial / LPF 0-5 (*)    All other components within normal limits  I-STAT CG4 LACTIC ACID, ED - Abnormal; Notable for the following:    Lactic Acid, Venous 3.91 (*)    All other components within normal limits  LIPASE, BLOOD  CBC WITH DIFFERENTIAL/PLATELET    EKG  EKG Interpretation None       Radiology Ct Abdomen Pelvis W Contrast  Result Date: 06/13/2016 CLINICAL DATA:  Acute onset of generalized abdominal pain and vomiting. Initial encounter. EXAM: CT ABDOMEN AND PELVIS WITH CONTRAST TECHNIQUE: Multidetector CT imaging of the abdomen and pelvis was performed using the standard protocol following bolus administration of intravenous contrast. CONTRAST:  17mL ISOVUE-300 IOPAMIDOL (ISOVUE-300) INJECTION 61% COMPARISON:  CT of the abdomen and pelvis from 02/26/2016 FINDINGS: Lower chest: Right basilar opacity likely reflects atelectasis. Scattered coronary artery calcifications are seen. Hepatobiliary: A 7 mm hypodensity is noted at the hepatic dome. There is mild prominence of the intrahepatic biliary ducts, with dilatation of the common bile duct to 1.2 cm in maximal diameter, raising concern for distal obstruction. The gallbladder is unremarkable in appearance. Pancreas: The pancreas is within normal limits. Spleen: The spleen is unremarkable in appearance. Adrenals/Urinary Tract: The adrenal glands are unremarkable in  appearance. The kidneys are within normal limits. There is no evidence of hydronephrosis. No renal or ureteral stones are identified. No perinephric stranding is seen. Stomach/Bowel: There is dilatation of small-bowel loops up to 4.1 cm in maximal diameter, with gradual fecalization, concerning for dysmotility. Associated wall thickening is noted at the mid to distal ileum. The lack of bowel decompression suggests against  bowel obstruction. Postoperative change is noted at the ascending colon. Scattered diverticulosis is noted along the ascending, descending and sigmoid colon, without evidence of diverticulitis. The stomach is grossly unremarkable in appearance. Vascular/Lymphatic: Scattered calcification is seen along the abdominal aorta and its branches. The abdominal aorta is otherwise grossly unremarkable. The inferior vena cava is grossly unremarkable. No retroperitoneal lymphadenopathy is seen. No pelvic sidewall lymphadenopathy is identified. Reproductive: The bladder is decompressed and not well assessed. The uterus is grossly unremarkable. The ovaries are relatively symmetric. No suspicious adnexal masses are seen. Other: No additional soft tissue abnormalities are seen. Musculoskeletal: No acute osseous abnormalities are identified. Multilevel vacuum phenomenon is noted at the lower lumbar spine, with endplate sclerosis. The visualized musculature is unremarkable in appearance. IMPRESSION: 1. Dilatation of small-bowel loops to 4.1 cm in maximal diameter, with gradual fecalization. Associated wall thickening at the mid to distal ileum, concerning for infectious or inflammatory ileitis, with underlying dysmotility. No evidence of bowel obstruction. 2. Dilatation of the common bile duct to 1.2 cm in maximal diameter, with mild prominence of the intrahepatic biliary ducts, raising concern for distal obstruction. This is grossly stable from 2017 and may reflect the patient's baseline. 3. Right basilar airspace  opacity likely reflects atelectasis. 4. Scattered coronary artery calcifications seen. 5. 7 mm nonspecific hypodensity at the hepatic dome. 6. Scattered diverticulosis along the ascending, descending and sigmoid colon, without evidence of diverticulitis. 7. Scattered aortic atherosclerosis. 8. Mild degenerative change at the lower lumbar spine. Electronically Signed   By: Garald Balding M.D.   On: 06/13/2016 01:01    Procedures Procedures (including critical care time)  Medications Ordered in ED Medications  iopamidol (ISOVUE-300) 61 % injection (not administered)  sodium chloride 0.9 % bolus 500 mL (not administered)  ondansetron (ZOFRAN-ODT) disintegrating tablet 8 mg (8 mg Oral Given 06/12/16 2216)  fentaNYL (SUBLIMAZE) injection 50 mcg (50 mcg Intravenous Given 06/13/16 0030)  sodium chloride 0.9 % bolus 1,000 mL (1,000 mLs Intravenous New Bag/Given 06/13/16 0017)  iopamidol (ISOVUE-300) 61 % injection 100 mL (100 mLs Intravenous Contrast Given 06/13/16 0027)     Initial Impression / Assessment and Plan / ED Course  I have reviewed the triage vital signs and the nursing notes.  Pertinent labs & imaging results that were available during my care of the patient were reviewed by me and considered in my medical decision making (see chart for details).     82 year old female presents to the emergency department for evaluation of abdominal pain with frequent bowel movements as well as vomiting. She has a history of bowel obstruction 2. Lactate noted to be elevated at 3.91. Laboratory workup also shows mild AKI with elevated creatinine. Likely due to dehydration. IVF given.  CT ordered which shows evidence of ileitis with distal gradual fecalization. Question infectious process versus inflammatory. It is also possible that patient may have a partial or full bowel obstruction secondary to ileus. Given persistent symptoms and elevated lactate, will admit for further management. Case discussed with  [facility] Tamala [facility] of [facility] who will admit.   Final Clinical Impressions(s) / ED Diagnoses   Final diagnoses:  Ileitis  Nausea and vomiting, intractability of vomiting not specified, unspecified vomiting type    New Prescriptions New Prescriptions   No medications on file   I personally performed the services described in this documentation, which was scribed in my presence. The recorded information has been reviewed and is accurate.      Antonietta Breach, PA-C 06/13/16 [id number]  Rolland Porter, MD 06/13/16 [phone]

## 2016-06-13 ENCOUNTER — Emergency Department (HOSPITAL_COMMUNITY): Payer: Medicare Other

## 2016-06-13 ENCOUNTER — Observation Stay (HOSPITAL_COMMUNITY): Payer: Medicare Other

## 2016-06-13 ENCOUNTER — Encounter (HOSPITAL_COMMUNITY): Payer: Self-pay | Admitting: Radiology

## 2016-06-13 DIAGNOSIS — R1084 Generalized abdominal pain: Secondary | ICD-10-CM | POA: Diagnosis not present

## 2016-06-13 DIAGNOSIS — K529 Noninfective gastroenteritis and colitis, unspecified: Secondary | ICD-10-CM | POA: Diagnosis not present

## 2016-06-13 DIAGNOSIS — N179 Acute kidney failure, unspecified: Secondary | ICD-10-CM | POA: Diagnosis not present

## 2016-06-13 DIAGNOSIS — R112 Nausea with vomiting, unspecified: Secondary | ICD-10-CM | POA: Diagnosis present

## 2016-06-13 LAB — URINALYSIS, ROUTINE W REFLEX MICROSCOPIC
Bacteria, UA: NONE SEEN
Bilirubin Urine: NEGATIVE
Glucose, UA: NEGATIVE mg/dL
HGB URINE DIPSTICK: NEGATIVE
KETONES UR: NEGATIVE mg/dL
Nitrite: NEGATIVE
PROTEIN: 100 mg/dL — AB
Specific Gravity, Urine: 1.021 (ref 1.005–1.030)
pH: 8 (ref 5.0–8.0)

## 2016-06-13 LAB — BASIC METABOLIC PANEL
ANION GAP: 8 (ref 5–15)
BUN: 10 mg/dL (ref 6–20)
CALCIUM: 8.9 mg/dL (ref 8.9–10.3)
CO2: 26 mmol/L (ref 22–32)
CREATININE: 0.91 mg/dL (ref 0.44–1.00)
Chloride: 105 mmol/L (ref 101–111)
GFR calc Af Amer: >60 mL/min (ref >60)
GFR, EST NON AFRICAN AMERICAN: 56 mL/min — AB (ref >60)
GLUCOSE: 131 mg/dL — AB (ref 65–99)
Potassium: 4.1 mmol/L (ref 3.5–5.1)
Sodium: 139 mmol/L (ref 135–145)

## 2016-06-13 LAB — CBC
HCT: 34.9 % — ABNORMAL LOW (ref 36.0–46.0)
Hemoglobin: 11.2 g/dL — ABNORMAL LOW (ref 12.0–15.0)
MCH: 26.9 pg (ref 26.0–34.0)
MCHC: 32.1 g/dL (ref 30.0–36.0)
MCV: 83.9 fL (ref 78.0–100.0)
PLATELETS: 210 10*3/uL (ref 150–400)
RBC: 4.16 MIL/uL (ref 3.87–5.11)
RDW: 14.7 % (ref 11.5–15.5)
WBC: 8.4 10*3/uL (ref 4.0–10.5)

## 2016-06-13 LAB — I-STAT CG4 LACTIC ACID, ED
LACTIC ACID, VENOUS: 3.91 mmol/L — AB (ref 0.5–1.9)
Lactic Acid, Venous: 2.98 mmol/L (ref 0.5–1.9)

## 2016-06-13 MED ORDER — ONDANSETRON HCL 4 MG/2ML IJ SOLN
4.0000 mg | Freq: Four times a day (QID) | INTRAMUSCULAR | Status: DC | PRN
Start: 2016-06-13 — End: 2016-06-19
  Administered 2016-06-13 – 2016-06-15 (×7): 4 mg via INTRAVENOUS
  Filled 2016-06-13 (×8): qty 2

## 2016-06-13 MED ORDER — IOPAMIDOL (ISOVUE-300) INJECTION 61%
100.0000 mL | Freq: Once | INTRAVENOUS | Status: AC | PRN
  Administered 2016-06-13: 100 mL via INTRAVENOUS

## 2016-06-13 MED ORDER — CIPROFLOXACIN IN D5W 400 MG/200ML IV SOLN
400.0000 mg | Freq: Two times a day (BID) | INTRAVENOUS | Status: DC
  Administered 2016-06-13 – 2016-06-17 (×9): 400 mg via INTRAVENOUS
  Filled 2016-06-13 (×7): qty 200

## 2016-06-13 MED ORDER — SODIUM CHLORIDE 0.9 % IV BOLUS (SEPSIS)
1000.0000 mL | Freq: Once | INTRAVENOUS | Status: AC
  Administered 2016-06-13: 1000 mL via INTRAVENOUS

## 2016-06-13 MED ORDER — PANTOPRAZOLE SODIUM 40 MG IV SOLR
40.0000 mg | Freq: Two times a day (BID) | INTRAVENOUS | Status: DC
  Administered 2016-06-13 – 2016-06-19 (×13): 40 mg via INTRAVENOUS
  Filled 2016-06-13 (×12): qty 40

## 2016-06-13 MED ORDER — SODIUM CHLORIDE 0.9 % IV SOLN
INTRAVENOUS | Status: DC
  Administered 2016-06-13 – 2016-06-17 (×4): via INTRAVENOUS

## 2016-06-13 MED ORDER — ACETAMINOPHEN 325 MG PO TABS
650.0000 mg | ORAL_TABLET | Freq: Four times a day (QID) | ORAL | Status: DC | PRN
  Administered 2016-06-14: 650 mg via ORAL
  Filled 2016-06-13 (×2): qty 2

## 2016-06-13 MED ORDER — FENTANYL CITRATE (PF) 100 MCG/2ML IJ SOLN
50.0000 ug | Freq: Once | INTRAMUSCULAR | Status: AC
  Administered 2016-06-13: 50 ug via INTRAVENOUS
  Filled 2016-06-13: qty 2

## 2016-06-13 MED ORDER — MORPHINE SULFATE (PF) 4 MG/ML IV SOLN
2.0000 mg | INTRAVENOUS | Status: DC | PRN
  Administered 2016-06-13 – 2016-06-15 (×5): 2 mg via INTRAVENOUS
  Filled 2016-06-13 (×5): qty 1

## 2016-06-13 MED ORDER — ONDANSETRON HCL 4 MG PO TABS: 4.0000 mg | ORAL_TABLET | Freq: Four times a day (QID) | ORAL | Status: DC | PRN

## 2016-06-13 MED ORDER — FENTANYL CITRATE (PF) 100 MCG/2ML IJ SOLN
25.0000 ug | INTRAMUSCULAR | Status: DC | PRN
  Administered 2016-06-13 (×3): 50 ug via INTRAVENOUS
  Filled 2016-06-13 (×3): qty 2

## 2016-06-13 MED ORDER — IOPAMIDOL (ISOVUE-300) INJECTION 61%
INTRAVENOUS | Status: AC
  Filled 2016-06-13: qty 100

## 2016-06-13 MED ORDER — METRONIDAZOLE IN NACL 5-0.79 MG/ML-% IV SOLN
500.0000 mg | Freq: Three times a day (TID) | INTRAVENOUS | Status: DC
  Administered 2016-06-13 – 2016-06-17 (×14): 500 mg via INTRAVENOUS
  Filled 2016-06-13 (×14): qty 100

## 2016-06-13 MED ORDER — LORAZEPAM 2 MG/ML IJ SOLN
0.2500 mg | Freq: Four times a day (QID) | INTRAMUSCULAR | Status: DC | PRN
  Administered 2016-06-13 – 2016-06-18 (×9): 0.25 mg via INTRAVENOUS
  Filled 2016-06-13 (×9): qty 1

## 2016-06-13 MED ORDER — ENOXAPARIN SODIUM 40 MG/0.4ML ~~LOC~~ SOLN
40.0000 mg | SUBCUTANEOUS | Status: DC
  Administered 2016-06-13 – 2016-06-19 (×7): 40 mg via SUBCUTANEOUS
  Filled 2016-06-13 (×7): qty 0.4

## 2016-06-13 MED ORDER — SODIUM CHLORIDE 0.9 % IV BOLUS (SEPSIS)
500.0000 mL | Freq: Once | INTRAVENOUS | Status: AC
  Administered 2016-06-13: 500 mL via INTRAVENOUS

## 2016-06-13 MED ORDER — ACETAMINOPHEN 650 MG RE SUPP: 650.0000 mg | Freq: Four times a day (QID) | RECTAL | Status: DC | PRN

## 2016-06-13 MED ORDER — ALBUTEROL SULFATE (2.5 MG/3ML) 0.083% IN NEBU: 2.5000 mg | INHALATION_SOLUTION | RESPIRATORY_TRACT | Status: DC | PRN

## 2016-06-13 NOTE — H&P (Addendum)
History and Physical    [name] [name] XIP:[id number] DOB: 01/12/1929 DOA: 06/12/2016  Referring MD/NP/PA: Antonietta Breach, PA-C PCP: [name], MD  Patient coming from: Home  Chief Complaint: Abdominal pain  HPI: [name] [name] is a 83 y.o. female with medical history significant of HTN, SBO, generalized anxiety disorder, GERD; who presented with complaints of generalized abdominal pain that began yesterday afternoon Patient reports eating [employer] and spinanch about 1 hour and a halfs prior to onset of symptoms. Shortly thereafter patient developed nausea and vomiting. Associated symptoms included abdominal distention and frequent belching. Patient reports that she's not been recently on any antibiotics. She still is able to pass some flatus and reports that she had 3 normal consistency bowel movements yesterday. Denies any fevers, cough, chills, shortness of breath, or recent sick contacts. Review of records reveals the patient has had admissions in 02/2016 and 03/2015 with no small bowel obstructions which were able to be managed conservatively.  ED Course: Upon admission to the emergency department patient was evaluated and seen to be afebrile with vital signs relatively within normal limits. Labs revealed lactic acid of 3.91. Urinalysis was negative for any signs of infection. CT scan of the abdomen reveals small bowel dilatation with distal ileum wall thickening, but no distinctive bowel obstruction. Patient was given half a liter normal saline while in the emergency department.    Review of Systems: As per HPI otherwise 10 point review of systems negative.   Past Medical History:  Diagnosis Date  . Allergy    SEASONAL  . Anemia   . Anxiety   . Arthritis    KNEES/HANDS  . 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 Blind loop syndrome   . Complication of anesthesia    pt can't remember exact complication, but she remembers about 30 years ago she may have had trouble waking up  from anesthesia.  . Fatty liver   . GERD (gastroesophageal reflux disease)   . Hiatal hernia   . Hypertension   . MVP (mitral valve prolapse)   . PVC's (premature ventricular contractions)   . Schatzki's ring   . Small bowel obstruction ([place])   . Vitamin B12 deficiency     Past Surgical History:  Procedure Laterality Date  . CATARACT EXTRACTION, BILATERAL  03-26-11  . HEMICOLECTOMY     and ileectomy Dr Vida Rigger; due to perforated viscus  . I&D KNEE WITH POLY EXCHANGE  04/03/2011   Procedure: IRRIGATION AND DEBRIDEMENT KNEE WITH POLY EXCHANGE;  Surgeon: Mcarthur Rossetti, MD;  Location: [facility];  Service: Orthopedics;  Laterality: Right;  . KNEE CLOSED REDUCTION  01/22/2011   Procedure: CLOSED MANIPULATION KNEE;  Surgeon: Mcarthur Rossetti;  Location: [facility];  Service: Orthopedics;  Laterality: Right;  Manipulation under anesthesia right knee  . Nuclear stress test  Sept 2012   Normal  . REPLACEMENT TOTAL KNEE     right  . ROTATOR CUFF REPAIR  03-26-11   right arm  . SYNOVECTOMY  04/03/2011   Procedure: SYNOVECTOMY;  Surgeon: Mcarthur Rossetti, MD;  Location: [facility];  Service: Orthopedics;  Laterality: Right;  . TOTAL KNEE ARTHROPLASTY Left 11/13/2014   Procedure: TOTAL KNEE ARTHROPLASTY;  Surgeon: Paralee Cancel, MD;  Location: [facility];  Service: Orthopedics;  Laterality: Left;  . TOTAL KNEE REVISION Right 07/14/2012   Procedure: RIGHT TOTAL KNEE REVISION;  Surgeon: Mauri Pole, MD;  Location: [facility];  Service: Orthopedics;  Laterality: Right;  . TUBAL LIGATION  reports that she has never smoked. She has never used smokeless tobacco. She reports that she does not drink alcohol or use drugs.  No Known Allergies  Family History  Problem Relation Age of Onset  . Hypertension Sister   . Diabetes Sister   . Diabetes Brother   . Coronary artery disease Brother   . Deep vein thrombosis Sister   . Colon cancer Neg Hx   . Colon polyps Neg Hx     Prior to Admission  medications   Medication Sig Start Date End Date Taking? Authorizing Provider  acetaminophen (TYLENOL) 500 MG tablet Take 1,000 mg by mouth every 6 (six) hours as needed for moderate pain.   Yes [provider]  ALPRAZolam (XANAX) 0.25 MG tablet Take 1 tablet (0.25 mg total) by mouth 4 (four) times daily as needed. ANXIETY/ SPASM Patient taking differently: Take 0.25 mg by mouth 4 (four) times daily as needed for anxiety.  07/15/12  Yes Babish, Rodman Key, PA-C  amLODipine (NORVASC) 5 MG tablet Take 5 mg by mouth every morning.  03/21/15  Yes [provider]  fish oil-omega-3 fatty acids 1000 MG capsule Take 1 g by mouth every morning.    Yes [provider]  loratadine (CLARITIN) 10 MG tablet Take 10 mg by mouth daily as needed for allergies.    Yes [provider]  polyethylene glycol (MIRALAX) packet Take 17 g by mouth daily as needed for mild constipation. 02/28/16  Yes Barton Dubois, MD  traMADol (ULTRAM) 50 MG tablet Take 1 tablet (50 mg total) by mouth every 6 (six) hours as needed for moderate pain.  Every 6-8 hours as needed for pain 02/28/16  Yes Barton Dubois, MD  Vitamin D, Ergocalciferol, (DRISDOL) 50000 UNITS CAPS Take 50,000 Units by mouth every 7 (seven) days. Friday   Yes [provider]  VOLTAREN 1 % GEL Apply 2 g topically 3 (three) times daily as needed (pain). To right knee as needed for pain 12/26/14  Yes [provider]    Physical Exam:    Constitutional: Elderly female who appears to be in some mild discomfort. Vitals:   06/13/16 0058 06/13/16 0100 06/13/16 0130 06/13/16 0200  BP: (!) 160/75 (!) 162/76 (!) 172/83 (!) 168/80  Pulse: 82 78 85 86  Resp: 18     Temp:      TempSrc:      SpO2: 100% 99% 98% 97%  Weight:      Height:       Eyes: PERRL, lids and conjunctivae normal ENMT: Mucous membranes are dry. Posterior pharynx clear of any exudate or lesions.  Neck: normal, supple, no masses, no thyromegaly Respiratory:  clear to auscultation bilaterally, no wheezing, no crackles. Normal respiratory effort. No accessory muscle use.  Cardiovascular: Regular rate and rhythm, no murmurs / rubs / gallops. No extremity edema. 2+ pedal pulses. No carotid bruits.  Abdomen: Mild distention with generalized tenderness to palpation, no masses palpated. No hepatosplenomegaly. Bowel sounds positive.  Musculoskeletal: no clubbing / cyanosis. No joint deformity upper and lower extremities. Good ROM, no contractures. Normal muscle tone.  Skin: no rashes, lesions, ulcers. No induration Neurologic: CN 2-12 grossly intact. Sensation intact, DTR normal. Strength 5/5 in all 4.  Psychiatric: Normal judgment and insight. Alert and oriented x 3. Anxious mood.     Labs on Admission: I have personally reviewed following labs and imaging studies  CBC:  Recent Labs Lab 06/12/16 2223  WBC 9.1  NEUTROABS 7.6  HGB 12.3  HCT 37.9  MCV 84.4  PLT 330   Basic Metabolic Panel:  Recent Labs Lab 06/12/16 2223  NA 136  K 4.5  CL 101  CO2 25  GLUCOSE 153*  BUN 14  CREATININE 1.04*  CALCIUM 9.5   GFR: Estimated Creatinine Clearance: 40.6 mL/min (A) (by C-G formula based on SCr of 1.04 mg/dL (H)). Liver Function Tests:  Recent Labs Lab 06/12/16 2223  AST 34  ALT 28  ALKPHOS 52  BILITOT 0.4  PROT 7.8  ALBUMIN 3.9    Recent Labs Lab 06/12/16 2223  LIPASE 39   No results for input(s): AMMONIA in the last 168 hours. Coagulation Profile: No results for input(s): INR, PROTIME in the last 168 hours. Cardiac Enzymes: No results for input(s): CKTOTAL, CKMB, CKMBINDEX, TROPONINI in the last 168 hours. BNP (last 3 results) No results for input(s): PROBNP in the last 8760 hours. HbA1C: No results for input(s): HGBA1C in the last 72 hours. CBG: No results for input(s): GLUCAP in the last 168 hours. Lipid Profile: No results for input(s): CHOL, HDL, LDLCALC, TRIG, CHOLHDL, LDLDIRECT in the last 72 hours. Thyroid  Function Tests: No results for input(s): TSH, T4TOTAL, FREET4, T3FREE, THYROIDAB in the last 72 hours. Anemia Panel: No results for input(s): VITAMINB12, FOLATE, FERRITIN, TIBC, IRON, RETICCTPCT in the last 72 hours. Urine analysis:    Component Value Date/Time   COLORURINE YELLOW 06/13/2016 0026   APPEARANCEUR CLEAR 06/13/2016 0026   LABSPEC 1.021 06/13/2016 0026   PHURINE 8.0 06/13/2016 0026   GLUCOSEU NEGATIVE 06/13/2016 0026   HGBUR NEGATIVE 06/13/2016 0026   BILIRUBINUR NEGATIVE 06/13/2016 0026   KETONESUR NEGATIVE 06/13/2016 0026   PROTEINUR 100 (A) 06/13/2016 0026   UROBILINOGEN 0.2 11/02/2014 0900   NITRITE NEGATIVE 06/13/2016 0026   LEUKOCYTESUR TRACE (A) 06/13/2016 0026   Sepsis Labs: No results found for this or any previous visit (from the past 240 hour(s)).   Radiological Exams on Admission: Ct Abdomen Pelvis W Contrast  Result Date: 06/13/2016 CLINICAL DATA:  Acute onset of generalized abdominal pain and vomiting. Initial encounter. EXAM: CT ABDOMEN AND PELVIS WITH CONTRAST TECHNIQUE: Multidetector CT imaging of the abdomen and pelvis was performed using the standard protocol following bolus administration of intravenous contrast. CONTRAST:  145mL ISOVUE-300 IOPAMIDOL (ISOVUE-300) INJECTION 61% COMPARISON:  CT of the abdomen and pelvis from 02/26/2016 FINDINGS: Lower chest: Right basilar opacity likely reflects atelectasis. Scattered coronary artery calcifications are seen. Hepatobiliary: A 7 mm hypodensity is noted at the hepatic dome. There is mild prominence of the intrahepatic biliary ducts, with dilatation of the common bile duct to 1.2 cm in maximal diameter, raising concern for distal obstruction. The gallbladder is unremarkable in appearance. Pancreas: The pancreas is within normal limits. Spleen: The spleen is unremarkable in appearance. Adrenals/Urinary Tract: The adrenal glands are unremarkable in appearance. The kidneys are within normal limits. There is no  evidence of hydronephrosis. No renal or ureteral stones are identified. No perinephric stranding is seen. Stomach/Bowel: There is dilatation of small-bowel loops up to 4.1 cm in maximal diameter, with gradual fecalization, concerning for dysmotility. Associated wall thickening is noted at the mid to distal ileum. The lack of bowel decompression suggests against bowel obstruction. Postoperative change is noted at the ascending colon. Scattered diverticulosis is noted along the ascending, descending and sigmoid colon, without evidence of diverticulitis. The stomach is grossly unremarkable in appearance. Vascular/Lymphatic: Scattered calcification is seen along the abdominal aorta and its branches. The abdominal aorta is otherwise grossly unremarkable. The inferior  vena cava is grossly unremarkable. No retroperitoneal lymphadenopathy is seen. No pelvic sidewall lymphadenopathy is identified. Reproductive: The bladder is decompressed and not well assessed. The uterus is grossly unremarkable. The ovaries are relatively symmetric. No suspicious adnexal masses are seen. Other: No additional soft tissue abnormalities are seen. Musculoskeletal: No acute osseous abnormalities are identified. Multilevel vacuum phenomenon is noted at the lower lumbar spine, with endplate sclerosis. The visualized musculature is unremarkable in appearance. IMPRESSION: 1. Dilatation of small-bowel loops to 4.1 cm in maximal diameter, with gradual fecalization. Associated wall thickening at the mid to distal ileum, concerning for infectious or inflammatory ileitis, with underlying dysmotility. No evidence of bowel obstruction. 2. Dilatation of the common bile duct to 1.2 cm in maximal diameter, with mild prominence of the intrahepatic biliary ducts, raising concern for distal obstruction. This is grossly stable from 2017 and may reflect the patient's baseline. 3. Right basilar airspace opacity likely reflects atelectasis. 4. Scattered coronary  artery calcifications seen. 5. 7 mm nonspecific hypodensity at the hepatic dome. 6. Scattered diverticulosis along the ascending, descending and sigmoid colon, without evidence of diverticulitis. 7. Scattered aortic atherosclerosis. 8. Mild degenerative change at the lower lumbar spine. Electronically Signed   By: Garald Balding M.D.   On: 06/13/2016 01:01      Assessment/Plan Generalized abd pain, Suspected Ileitis: Acute. Patient presents with complaints of generalized abdominal pain and distention following a meal. CT scan shows possible inflammatory vs. Infectious ileitis, but given patient's previous history suspect possible early small bowel obstruction. - Admit to MedSurg bed - NPO  - Check EKG - Empiric antibiotics of metronidazole and ciprofloxacin in case symptoms are infectious in nature given significantly elevated lactic acid - Check abdominal x-ray in a.m. - Consider need of NGT if symptoms worsen  Nausea and vomiting: Acute. If symptoms persist - Zofran prn N/V    Lactic acidosis : Acute. Patient's initial lactic acid elevated up to 3.91. Suspect this could've been secondary to dehydration given recent nausea and vomiting vs. infection. - Trend lactic acid levels   Essential hypertension - Restart home blood pressure medications when tolerating po  Generalized anxiety disorder  - Substitution for Ativan IV prn  GI prophylaxis: Protonix IV DVT prophylaxis: lovenox   Code Status: Full  Family Communication:  no family present at bedside  Disposition Plan: likely discharge home once medically stable and tolerating po Consults called: none  Admission status: Observation  Norval Morton MD [facility] Pager [phone]  If 7PM-7AM, please contact night-coverage [email or web address] Password [email or web address]  06/13/2016, 3:13 AM

## 2016-06-13 NOTE — ED Provider Notes (Signed)
Patient reports she started having lower abdominal pain about 4 PM today with nausea and vomiting but no diarrhea. She states she had similar pain earlier in the year when she was admitted for a bowel obstruction and she had it one other time before that.  Patient is alert and cooperative, her abdomen appears to be mildly distended, her exam was deferred to the PA.  Medical screening examination/treatment/procedure(s) were conducted as a shared visit with non-physician practitioner(s) and myself.  I personally evaluated the patient during the encounter.   EKG Interpretation None       Rolland Porter, MD, Barbette Or, MD 06/13/16 [phone]

## 2016-06-13 NOTE — ED Notes (Signed)
Dr. Fuller Plan, hospitalist, at bedside.

## 2016-06-13 NOTE — Progress Notes (Signed)
Patient ID: [name] [name], female   DOB: 1928-12-28, 83 y.o.   MRN: [id number]  PROGRESS NOTE    [name] [name]  OVF:[id number] DOB: 1928/07/30 DOA: 06/12/2016 PCP: [name], MD   Brief Narrative:  83 y.o. female with medical history significant of HTN, SBO, generalized anxiety disorder, GERD; who presented with complaints of generalized abdominal pain. She was found to have dietitian of small bowel loops with distal ileum wall thickening. She was admitted on IV antibiotics.   Assessment & Plan:   Principal Problem:   Ileitis Active Problems:   Essential hypertension   Generalized anxiety disorder   Nausea and vomiting   Generalized abdominal pain  Generalized abd pain, Suspected Ileitis with dilated small bowel loops: Acute.  - Repeat x-ray this morning does not show any bowel traction or ileus. Start clear liquid diet and advance as tolerated. - Continue empiric IV metronidazole and ciprofloxacin. Switch to oral antibiotics once tolerates diet - Switch IV fentanyl to IV morphine. Switch to oral pain medications once starts tolerating diet.  Nausea and vomiting: Acute.  - Improving - Zofran prn N/V    Lactic acidosis : Acute.  - Improving - Suspect this could've been secondary to dehydration given recent nausea and vomiting vs. infection. - Continue with IV antibiotics  Essential hypertension - Monitor blood pressure. - Blood pressure is on the higher side currently - Restart home blood pressure medications when tolerating po   DVT prophylaxis: Lovenox Code Status:  Full Family Communication: None present at bedside Disposition Plan: Home probably tomorrow if condition improves  Consultants: None  Procedures: None  Antimicrobials: Ciprofloxacin and Flagyl from 06/12/2016  Subjective: Patient seen and examined at bedside. She complains of mild abdominal pain and nausea. No fever, chest pain.  Objective: Vitals:   06/13/16 0300 06/13/16 0330 06/13/16  0333 06/13/16 0414  BP: (!) 183/89 (!) 181/92 (!) 186/92 (!) 160/71  Pulse: 100 98 96 95  Resp:   16 18  Temp:    98.6 F (37 C)  TempSrc:    Oral  SpO2: 97% 96% 97% 96%  Weight:    78.1 kg (172 lb 3.2 oz)  Height:        Intake/Output Summary (Last 24 hours) at 06/13/16 1049 Last data filed at 06/13/16 0841  Gross per 24 hour  Intake             2200 ml  Output                0 ml  Net             2200 ml   Filed Weights   06/12/16 2200 06/13/16 0414  Weight: 77.1 kg (170 lb) 78.1 kg (172 lb 3.2 oz)    Examination:  General exam: Appears calm and comfortable  Respiratory system: Bilateral decreased breath sound at bases Cardiovascular system: S1 & S2 heard, Rate controlled.  Gastrointestinal system: Abdomen is nondistended, soft and mild diffuse tenderness. No rebound tenderness. Bowel sounds sluggish. Extremities: No cyanosis, clubbing, edema   Data Reviewed: I have personally reviewed following labs and imaging studies  CBC:  Recent Labs Lab 06/12/16 2223 06/13/16 0552  WBC 9.1 8.4  NEUTROABS 7.6  --   HGB 12.3 11.2*  HCT 37.9 34.9*  MCV 84.4 83.9  PLT 231 841   Basic Metabolic Panel:  Recent Labs Lab 06/12/16 2223 06/13/16 0552  NA 136 139  K 4.5 4.1  CL 101 105  CO2 25 26  GLUCOSE 153* 131*  BUN 14 10  CREATININE 1.04* 0.91  CALCIUM 9.5 8.9   GFR: Estimated Creatinine Clearance: 46.7 mL/min (by C-G formula based on SCr of 0.91 mg/dL). Liver Function Tests:  Recent Labs Lab 06/12/16 2223  AST 34  ALT 28  ALKPHOS 52  BILITOT 0.4  PROT 7.8  ALBUMIN 3.9    Recent Labs Lab 06/12/16 2223  LIPASE 39   No results for input(s): AMMONIA in the last 168 hours. Coagulation Profile: No results for input(s): INR, PROTIME in the last 168 hours. Cardiac Enzymes: No results for input(s): CKTOTAL, CKMB, CKMBINDEX, TROPONINI in the last 168 hours. BNP (last 3 results) No results for input(s): PROBNP in the last 8760 hours. HbA1C: No results  for input(s): HGBA1C in the last 72 hours. CBG: No results for input(s): GLUCAP in the last 168 hours. Lipid Profile: No results for input(s): CHOL, HDL, LDLCALC, TRIG, CHOLHDL, LDLDIRECT in the last 72 hours. Thyroid Function Tests: No results for input(s): TSH, T4TOTAL, FREET4, T3FREE, THYROIDAB in the last 72 hours. Anemia Panel: No results for input(s): VITAMINB12, FOLATE, FERRITIN, TIBC, IRON, RETICCTPCT in the last 72 hours. Sepsis Labs:  Recent Labs Lab 06/13/16 0029 06/13/16 0332  LATICACIDVEN 3.91* 2.98*    No results found for this or any previous visit (from the past 240 hour(s)).       Radiology Studies: Dg Abd 1 View  Result Date: 06/13/2016 CLINICAL DATA:  Generalized abdominal pain, nausea, vomiting. EXAM: ABDOMEN - 1 VIEW COMPARISON:  Radiograph of February 26, 2016. FINDINGS: The bowel gas pattern is normal. Residual contrast is noted in the urinary bladder. Postsurgical changes are seen in right lower quadrant. No calculi are noted. IMPRESSION: No evidence of bowel obstruction or ileus. Electronically Signed   By: Marijo Conception, M.D.   On: 06/13/2016 09:19   Ct Abdomen Pelvis W Contrast  Result Date: 06/13/2016 CLINICAL DATA:  Acute onset of generalized abdominal pain and vomiting. Initial encounter. EXAM: CT ABDOMEN AND PELVIS WITH CONTRAST TECHNIQUE: Multidetector CT imaging of the abdomen and pelvis was performed using the standard protocol following bolus administration of intravenous contrast. CONTRAST:  137mL ISOVUE-300 IOPAMIDOL (ISOVUE-300) INJECTION 61% COMPARISON:  CT of the abdomen and pelvis from 02/26/2016 FINDINGS: Lower chest: Right basilar opacity likely reflects atelectasis. Scattered coronary artery calcifications are seen. Hepatobiliary: A 7 mm hypodensity is noted at the hepatic dome. There is mild prominence of the intrahepatic biliary ducts, with dilatation of the common bile duct to 1.2 cm in maximal diameter, raising concern for distal  obstruction. The gallbladder is unremarkable in appearance. Pancreas: The pancreas is within normal limits. Spleen: The spleen is unremarkable in appearance. Adrenals/Urinary Tract: The adrenal glands are unremarkable in appearance. The kidneys are within normal limits. There is no evidence of hydronephrosis. No renal or ureteral stones are identified. No perinephric stranding is seen. Stomach/Bowel: There is dilatation of small-bowel loops up to 4.1 cm in maximal diameter, with gradual fecalization, concerning for dysmotility. Associated wall thickening is noted at the mid to distal ileum. The lack of bowel decompression suggests against bowel obstruction. Postoperative change is noted at the ascending colon. Scattered diverticulosis is noted along the ascending, descending and sigmoid colon, without evidence of diverticulitis. The stomach is grossly unremarkable in appearance. Vascular/Lymphatic: Scattered calcification is seen along the abdominal aorta and its branches. The abdominal aorta is otherwise grossly unremarkable. The inferior vena cava is grossly unremarkable. No retroperitoneal lymphadenopathy is seen. No pelvic sidewall lymphadenopathy is identified. Reproductive:  The bladder is decompressed and not well assessed. The uterus is grossly unremarkable. The ovaries are relatively symmetric. No suspicious adnexal masses are seen. Other: No additional soft tissue abnormalities are seen. Musculoskeletal: No acute osseous abnormalities are identified. Multilevel vacuum phenomenon is noted at the lower lumbar spine, with endplate sclerosis. The visualized musculature is unremarkable in appearance. IMPRESSION: 1. Dilatation of small-bowel loops to 4.1 cm in maximal diameter, with gradual fecalization. Associated wall thickening at the mid to distal ileum, concerning for infectious or inflammatory ileitis, with underlying dysmotility. No evidence of bowel obstruction. 2. Dilatation of the common bile duct to  1.2 cm in maximal diameter, with mild prominence of the intrahepatic biliary ducts, raising concern for distal obstruction. This is grossly stable from 2017 and may reflect the patient's baseline. 3. Right basilar airspace opacity likely reflects atelectasis. 4. Scattered coronary artery calcifications seen. 5. 7 mm nonspecific hypodensity at the hepatic dome. 6. Scattered diverticulosis along the ascending, descending and sigmoid colon, without evidence of diverticulitis. 7. Scattered aortic atherosclerosis. 8. Mild degenerative change at the lower lumbar spine. Electronically Signed   By: Garald Balding M.D.   On: 06/13/2016 01:01        Scheduled Meds: . enoxaparin (LOVENOX) injection  40 mg Subcutaneous Q24H  . iopamidol      . pantoprazole (PROTONIX) IV  40 mg Intravenous Q12H   Continuous Infusions: . sodium chloride 100 mL/hr at 06/13/16 0315  . ciprofloxacin Stopped (06/13/16 0600)  . metronidazole Stopped (06/13/16 1245)     LOS: 0 days        Aline August, MD [facility] Pager [phone]  If 7PM-7AM, please contact night-coverage [email or web address] Password [email or web address] 06/13/2016, 10:49 AM

## 2016-06-14 ENCOUNTER — Observation Stay (HOSPITAL_COMMUNITY): Payer: Medicare Other

## 2016-06-14 DIAGNOSIS — I10 Essential (primary) hypertension: Secondary | ICD-10-CM | POA: Diagnosis present

## 2016-06-14 DIAGNOSIS — K567 Ileus, unspecified: Secondary | ICD-10-CM | POA: Diagnosis present

## 2016-06-14 DIAGNOSIS — E876 Hypokalemia: Secondary | ICD-10-CM | POA: Diagnosis present

## 2016-06-14 DIAGNOSIS — K529 Noninfective gastroenteritis and colitis, unspecified: Secondary | ICD-10-CM | POA: Diagnosis not present

## 2016-06-14 DIAGNOSIS — K56609 Unspecified intestinal obstruction, unspecified as to partial versus complete obstruction: Secondary | ICD-10-CM | POA: Diagnosis not present

## 2016-06-14 DIAGNOSIS — Z79899 Other long term (current) drug therapy: Secondary | ICD-10-CM | POA: Diagnosis not present

## 2016-06-14 DIAGNOSIS — F411 Generalized anxiety disorder: Secondary | ICD-10-CM | POA: Diagnosis present

## 2016-06-14 DIAGNOSIS — Z96651 Presence of right artificial knee joint: Secondary | ICD-10-CM | POA: Diagnosis present

## 2016-06-14 DIAGNOSIS — E872 Acidosis: Secondary | ICD-10-CM | POA: Diagnosis present

## 2016-06-14 DIAGNOSIS — K5651 Intestinal adhesions [bands], with partial obstruction: Secondary | ICD-10-CM | POA: Diagnosis present

## 2016-06-14 DIAGNOSIS — K219 Gastro-esophageal reflux disease without esophagitis: Secondary | ICD-10-CM | POA: Diagnosis present

## 2016-06-14 LAB — COMPREHENSIVE METABOLIC PANEL
ALK PHOS: 38 U/L (ref 38–126)
ALT: 19 U/L (ref 14–54)
AST: 23 U/L (ref 15–41)
Albumin: 3.1 g/dL — ABNORMAL LOW (ref 3.5–5.0)
Anion gap: 7 (ref 5–15)
BUN: 7 mg/dL (ref 6–20)
CALCIUM: 8.9 mg/dL (ref 8.9–10.3)
CO2: 23 mmol/L (ref 22–32)
Chloride: 107 mmol/L (ref 101–111)
Creatinine, Ser: 0.87 mg/dL (ref 0.44–1.00)
GFR calc Af Amer: >60 mL/min (ref >60)
GFR calc non Af Amer: 59 mL/min — ABNORMAL LOW (ref >60)
GLUCOSE: 118 mg/dL — AB (ref 65–99)
Potassium: 3.8 mmol/L (ref 3.5–5.1)
SODIUM: 137 mmol/L (ref 135–145)
Total Bilirubin: 0.6 mg/dL (ref 0.3–1.2)
Total Protein: 6.5 g/dL (ref 6.5–8.1)

## 2016-06-14 LAB — CBC WITH DIFFERENTIAL/PLATELET
Basophils Absolute: 0 10*3/uL (ref 0.0–0.1)
Basophils Relative: 0 %
Eosinophils Absolute: 0 10*3/uL (ref 0.0–0.7)
Eosinophils Relative: 0 %
HEMATOCRIT: 33.6 % — AB (ref 36.0–46.0)
HEMOGLOBIN: 10.6 g/dL — AB (ref 12.0–15.0)
Lymphocytes Relative: 12 %
Lymphs Abs: 0.8 10*3/uL (ref 0.7–4.0)
MCH: 26.6 pg (ref 26.0–34.0)
MCHC: 31.5 g/dL (ref 30.0–36.0)
MCV: 84.4 fL (ref 78.0–100.0)
Monocytes Absolute: 0.6 10*3/uL (ref 0.1–1.0)
Monocytes Relative: 8 %
NEUTROS PCT: 80 %
Neutro Abs: 5.7 10*3/uL (ref 1.7–7.7)
Platelets: 191 10*3/uL (ref 150–400)
RBC: 3.98 MIL/uL (ref 3.87–5.11)
RDW: 14.8 % (ref 11.5–15.5)
WBC: 7.2 10*3/uL (ref 4.0–10.5)

## 2016-06-14 LAB — MAGNESIUM: Magnesium: 1.5 mg/dL — ABNORMAL LOW (ref 1.7–2.4)

## 2016-06-14 MED ORDER — METOPROLOL TARTRATE 5 MG/5ML IV SOLN
2.5000 mg | Freq: Four times a day (QID) | INTRAVENOUS | Status: DC | PRN
  Administered 2016-06-15: 2.5 mg via INTRAVENOUS
  Filled 2016-06-14: qty 5

## 2016-06-14 MED ORDER — ALUM & MAG HYDROXIDE-SIMETH 200-200-20 MG/5ML PO SUSP
15.0000 mL | Freq: Four times a day (QID) | ORAL | Status: DC | PRN
  Administered 2016-06-14 – 2016-06-16 (×3): 15 mL via ORAL
  Filled 2016-06-14 (×3): qty 30

## 2016-06-14 MED ORDER — METOCLOPRAMIDE HCL 5 MG/5ML PO SOLN
10.0000 mg | Freq: Four times a day (QID) | ORAL | Status: DC | PRN
  Administered 2016-06-14: 10 mg via ORAL
  Filled 2016-06-14 (×3): qty 10

## 2016-06-14 MED ORDER — METOCLOPRAMIDE HCL 5 MG/5ML PO SOLN
10.0000 mg | Freq: Four times a day (QID) | ORAL | Status: DC | PRN
  Administered 2016-06-15: 10 mg via ORAL
  Filled 2016-06-14: qty 10

## 2016-06-14 MED ORDER — HYDRALAZINE HCL 20 MG/ML IJ SOLN
10.0000 mg | INTRAMUSCULAR | Status: DC | PRN
  Administered 2016-06-14 – 2016-06-15 (×6): 10 mg via INTRAVENOUS
  Filled 2016-06-14 (×6): qty 1
  Filled 2016-06-14: qty 0.5

## 2016-06-14 MED ORDER — MAGNESIUM SULFATE 2 GM/50ML IV SOLN
2.0000 g | Freq: Once | INTRAVENOUS | Status: AC
  Administered 2016-06-14: 2 g via INTRAVENOUS
  Filled 2016-06-14: qty 50

## 2016-06-14 NOTE — Progress Notes (Signed)
Patient ID: [name] [name], female   DOB: [birth year], 83 y.o.   MRN: [id number]  PROGRESS NOTE    [name] [name]  BSW:[id number] DOB: [birth year] DOA: 06/12/2016 PCP: [name], MD   Brief Narrative:  83 y.o.femalewith medical history significant ofHTN, SBO, generalized anxiety disorder,GERD; who presented with complaints of generalized abdominal pain. She was found to have dietitian of small bowel loops with distal ileum wall thickening. She was admitted on IV antibiotics. She is still complaining of abdominal pain with intermittent nausea and vomiting. Assessment & Plan:   Principal Problem:   Ileitis Active Problems:   Essential hypertension   Generalized anxiety disorder   Nausea and vomiting   Generalized abdominal pain  Generalized abd pain, Suspected Ileitis with dilated small bowel loops: Acute. - Patient was started on clear liquid diet yesterday which she hasn't tolerated that well. Repeat x-ray abdomen today. Continue antiemetics and IV analgesics. - Continue empiric IV metronidazole and ciprofloxacin. Switch to oral antibiotics once tolerates diet - Switch to oral pain medications once starts tolerating diet. - Admit the patient from observation unit to inpatient as patient is not improved.  Nausea and vomiting: Acute.  - Persistent - Zofran prn N/V  - Add Reglan  Lactic acidosis : Acute.  - Improving - Suspect this could've been secondary to dehydration given recent nausea and vomiting vs. infection. - Continue with IV antibiotics  Essential hypertension - Monitor blood pressure. - Blood pressure is on the higher side currently - Restart home blood pressure medications when tolerating po  Hypomagnesemia Replace and repeat a.m. labs    DVT prophylaxis: Lovenox Code Status:  Full Family Communication: None present at bedside Disposition Plan: Home probably tomorrow if condition improves  Consultants: None  Procedures: None  Antimicrobials:  Ciprofloxacin and Flagyl from 06/12/2016  Subjective:  Patient seen and examined at bedside. She still complains of nausea with intermittent moderate to severe abdominal pain. She has not tolerated her diet well. No fever, chest pain  Objective: Vitals:   06/13/16 2154 06/14/16 0003 06/14/16 0357 06/14/16 1020  BP: (!) 169/77 (!) 173/82 (!) 143/67 (!) 168/79  Pulse: 81 73 81 74  Resp: 18  16 18   Temp: 98.4 F (36.9 C)  98.6 F (37 C) 99 F (37.2 C)  TempSrc: Oral  Oral Oral  SpO2: 96%  97% 99%  Weight:      Height:        Intake/Output Summary (Last 24 hours) at 06/14/16 1242 Last data filed at 06/14/16 0900  Gross per 24 hour  Intake           2402.5 ml  Output                0 ml  Net           2402.5 ml   Filed Weights   06/12/16 2200 06/13/16 0414  Weight: 77.1 kg (170 lb) 78.1 kg (172 lb 3.2 oz)    Examination:  General exam: Appears calm and comfortable  Respiratory system: Bilateral decreased breath sound at bases Cardiovascular system: S1 & S2 heard,Rate controlled Gastrointestinal system: Abdomen is  distended soft and  diffusely tender in the epigastric and medical decision.  Bowel sounds sluggish Extremities: No cyanosis, clubbing, edema    Data Reviewed: I have personally reviewed following labs and imaging studies  CBC:  Recent Labs Lab 06/12/16 2223 06/13/16 0552 06/14/16 0340  WBC 9.1 8.4 7.2  NEUTROABS 7.6  --  5.7  HGB  12.3 11.2* 10.6*  HCT 37.9 34.9* 33.6*  MCV 84.4 83.9 84.4  PLT 231 210 536   Basic Metabolic Panel:  Recent Labs Lab 06/12/16 2223 06/13/16 0552 06/14/16 0340  NA 136 139 137  K 4.5 4.1 3.8  CL 101 105 107  CO2 25 26 23   GLUCOSE 153* 131* 118*  BUN 14 10 7   CREATININE 1.04* 0.91 0.87  CALCIUM 9.5 8.9 8.9  MG  --   --  1.5*   GFR: Estimated Creatinine Clearance: 48.8 mL/min (by C-G formula based on SCr of 0.87 mg/dL). Liver Function Tests:  Recent Labs Lab 06/12/16 2223 06/14/16 0340  AST 34 23  ALT  28 19  ALKPHOS 52 38  BILITOT 0.4 0.6  PROT 7.8 6.5  ALBUMIN 3.9 3.1*    Recent Labs Lab 06/12/16 2223  LIPASE 39   No results for input(s): AMMONIA in the last 168 hours. Coagulation Profile: No results for input(s): INR, PROTIME in the last 168 hours. Cardiac Enzymes: No results for input(s): CKTOTAL, CKMB, CKMBINDEX, TROPONINI in the last 168 hours. BNP (last 3 results) No results for input(s): PROBNP in the last 8760 hours. HbA1C: No results for input(s): HGBA1C in the last 72 hours. CBG: No results for input(s): GLUCAP in the last 168 hours. Lipid Profile: No results for input(s): CHOL, HDL, LDLCALC, TRIG, CHOLHDL, LDLDIRECT in the last 72 hours. Thyroid Function Tests: No results for input(s): TSH, T4TOTAL, FREET4, T3FREE, THYROIDAB in the last 72 hours. Anemia Panel: No results for input(s): VITAMINB12, FOLATE, FERRITIN, TIBC, IRON, RETICCTPCT in the last 72 hours. Sepsis Labs:  Recent Labs Lab 06/13/16 0029 06/13/16 0332  LATICACIDVEN 3.91* 2.98*    No results found for this or any previous visit (from the past 240 hour(s)).       Radiology Studies: Dg Abd 1 View  Result Date: 06/13/2016 CLINICAL DATA:  Generalized abdominal pain, nausea, vomiting. EXAM: ABDOMEN - 1 VIEW COMPARISON:  Radiograph of February 26, 2016. FINDINGS: The bowel gas pattern is normal. Residual contrast is noted in the urinary bladder. Postsurgical changes are seen in right lower quadrant. No calculi are noted. IMPRESSION: No evidence of bowel obstruction or ileus. Electronically Signed   By: Marijo Conception, M.D.   On: 06/13/2016 09:19   Ct Abdomen Pelvis W Contrast  Result Date: 06/13/2016 CLINICAL DATA:  Acute onset of generalized abdominal pain and vomiting. Initial encounter. EXAM: CT ABDOMEN AND PELVIS WITH CONTRAST TECHNIQUE: Multidetector CT imaging of the abdomen and pelvis was performed using the standard protocol following bolus administration of intravenous contrast.  CONTRAST:  141mL ISOVUE-300 IOPAMIDOL (ISOVUE-300) INJECTION 61% COMPARISON:  CT of the abdomen and pelvis from 02/26/2016 FINDINGS: Lower chest: Right basilar opacity likely reflects atelectasis. Scattered coronary artery calcifications are seen. Hepatobiliary: A 7 mm hypodensity is noted at the hepatic dome. There is mild prominence of the intrahepatic biliary ducts, with dilatation of the common bile duct to 1.2 cm in maximal diameter, raising concern for distal obstruction. The gallbladder is unremarkable in appearance. Pancreas: The pancreas is within normal limits. Spleen: The spleen is unremarkable in appearance. Adrenals/Urinary Tract: The adrenal glands are unremarkable in appearance. The kidneys are within normal limits. There is no evidence of hydronephrosis. No renal or ureteral stones are identified. No perinephric stranding is seen. Stomach/Bowel: There is dilatation of small-bowel loops up to 4.1 cm in maximal diameter, with gradual fecalization, concerning for dysmotility. Associated wall thickening is noted at the mid to distal ileum. The lack  of bowel decompression suggests against bowel obstruction. Postoperative change is noted at the ascending colon. Scattered diverticulosis is noted along the ascending, descending and sigmoid colon, without evidence of diverticulitis. The stomach is grossly unremarkable in appearance. Vascular/Lymphatic: Scattered calcification is seen along the abdominal aorta and its branches. The abdominal aorta is otherwise grossly unremarkable. The inferior vena cava is grossly unremarkable. No retroperitoneal lymphadenopathy is seen. No pelvic sidewall lymphadenopathy is identified. Reproductive: The bladder is decompressed and not well assessed. The uterus is grossly unremarkable. The ovaries are relatively symmetric. No suspicious adnexal masses are seen. Other: No additional soft tissue abnormalities are seen. Musculoskeletal: No acute osseous abnormalities are  identified. Multilevel vacuum phenomenon is noted at the lower lumbar spine, with endplate sclerosis. The visualized musculature is unremarkable in appearance. IMPRESSION: 1. Dilatation of small-bowel loops to 4.1 cm in maximal diameter, with gradual fecalization. Associated wall thickening at the mid to distal ileum, concerning for infectious or inflammatory ileitis, with underlying dysmotility. No evidence of bowel obstruction. 2. Dilatation of the common bile duct to 1.2 cm in maximal diameter, with mild prominence of the intrahepatic biliary ducts, raising concern for distal obstruction. This is grossly stable from 2017 and may reflect the patient's baseline. 3. Right basilar airspace opacity likely reflects atelectasis. 4. Scattered coronary artery calcifications seen. 5. 7 mm nonspecific hypodensity at the hepatic dome. 6. Scattered diverticulosis along the ascending, descending and sigmoid colon, without evidence of diverticulitis. 7. Scattered aortic atherosclerosis. 8. Mild degenerative change at the lower lumbar spine. Electronically Signed   By: Garald Balding M.D.   On: 06/13/2016 01:01   Dg Abd Portable 1v  Result Date: 06/14/2016 CLINICAL DATA:  Abdominal distention and pain EXAM: PORTABLE ABDOMEN - 1 VIEW COMPARISON:  None. FINDINGS: There is gaseous distention of the stomach. There is no bowel dilatation to suggest obstruction. There is no evidence of pneumoperitoneum, portal venous gas or pneumatosis. There are no pathologic calcifications along the expected course of the ureters. The osseous structures are unremarkable. IMPRESSION: 1. Isolated gaseous distension of the stomach. Otherwise no evidence of bowel obstruction. Electronically Signed   By: Kathreen Devoid   On: 06/14/2016 10:37        Scheduled Meds: . enoxaparin (LOVENOX) injection  40 mg Subcutaneous Q24H  . pantoprazole (PROTONIX) IV  40 mg Intravenous Q12H   Continuous Infusions: . sodium chloride 75 mL/hr at 06/13/16 1048   . ciprofloxacin Stopped (06/14/16 0913)  . metronidazole Stopped (06/14/16 0713)     LOS: 0 days        Aline August, MD [facility] Pager [phone]  If 7PM-7AM, please contact night-coverage [email or web address] Password [facility] 06/14/2016, 12:42 PM

## 2016-06-15 ENCOUNTER — Inpatient Hospital Stay (HOSPITAL_COMMUNITY): Payer: Medicare Other

## 2016-06-15 LAB — CBC WITH DIFFERENTIAL/PLATELET
BASOS ABS: 0 10*3/uL (ref 0.0–0.1)
Basophils Relative: 0 %
EOS ABS: 0 10*3/uL (ref 0.0–0.7)
Eosinophils Relative: 0 %
HCT: 35.7 % — ABNORMAL LOW (ref 36.0–46.0)
HEMOGLOBIN: 11.6 g/dL — AB (ref 12.0–15.0)
LYMPHS ABS: 1.2 10*3/uL (ref 0.7–4.0)
LYMPHS PCT: 14 %
MCH: 27 pg (ref 26.0–34.0)
MCHC: 32.5 g/dL (ref 30.0–36.0)
MCV: 83.2 fL (ref 78.0–100.0)
Monocytes Absolute: 0.5 10*3/uL (ref 0.1–1.0)
Monocytes Relative: 5 %
Neutro Abs: 7.1 10*3/uL (ref 1.7–7.7)
Neutrophils Relative %: 81 %
Platelets: 198 10*3/uL (ref 150–400)
RBC: 4.29 MIL/uL (ref 3.87–5.11)
RDW: 15.1 % (ref 11.5–15.5)
WBC: 8.8 10*3/uL (ref 4.0–10.5)

## 2016-06-15 LAB — MAGNESIUM: MAGNESIUM: 1.7 mg/dL (ref 1.7–2.4)

## 2016-06-15 LAB — BASIC METABOLIC PANEL
ANION GAP: 9 (ref 5–15)
BUN: 7 mg/dL (ref 6–20)
CHLORIDE: 102 mmol/L (ref 101–111)
CO2: 21 mmol/L — ABNORMAL LOW (ref 22–32)
Calcium: 8.8 mg/dL — ABNORMAL LOW (ref 8.9–10.3)
Creatinine, Ser: 0.8 mg/dL (ref 0.44–1.00)
GFR calc Af Amer: >60 mL/min (ref >60)
Glucose, Bld: 127 mg/dL — ABNORMAL HIGH (ref 65–99)
POTASSIUM: 3.5 mmol/L (ref 3.5–5.1)
SODIUM: 132 mmol/L — AB (ref 135–145)

## 2016-06-15 MED ORDER — SUCRALFATE 1 GM/10ML PO SUSP
1.0000 g | Freq: Three times a day (TID) | ORAL | Status: DC
Start: 2016-06-15 — End: 2016-06-19
  Administered 2016-06-15 – 2016-06-19 (×12): 1 g via ORAL
  Filled 2016-06-15 (×11): qty 10

## 2016-06-15 MED ORDER — FUROSEMIDE 10 MG/ML IJ SOLN
40.0000 mg | Freq: Once | INTRAMUSCULAR | Status: AC
  Administered 2016-06-15: 40 mg via INTRAVENOUS
  Filled 2016-06-15: qty 4

## 2016-06-15 NOTE — Progress Notes (Signed)
Patient ID: [name] [name], female   DOB: 09-30-1928, 83 y.o.   MRN: [id number]  PROGRESS NOTE    [name] [name]  IEP:[id number] DOB: January 11, 1929 DOA: 06/12/2016 PCP: [name], MD   Brief Narrative:  83 y.o.femalewith medical history significant ofHTN, SBO, generalized anxiety disorder,GERD; who presented with complaints of generalized abdominal pain. She was found to have dietitian of small bowel loops with distal ileum wall thickening. She was admitted onIV antibiotics. She is still complaining of abdominal pain with intermittent nausea and vomiting.   Assessment & Plan:   Principal Problem:   Ileitis Active Problems:   Essential hypertension   Generalized anxiety disorder   Nausea and vomiting   Generalized abdominal pain   Ileus (HCC)   Generalized abd pain, Suspected Ileitis with dilated small bowel loops: Acute. - Patient has been on clear liquid diet  which she hasn't tolerated that well. She still intermittently nauseous with vomiting this morning. Repeat x-ray abdomen today. Continue antiemetics and IV analgesics. - Continue empiric IV metronidazole and ciprofloxacin. Switch to oral antibiotics once tolerates diet - Switch to oral pain medications once starts tolerating diet. - If symptoms persist, will probably repeat CAT scan and might have to get gastroenterology evaluation. - Continue with Protonixand Maalox. Add Carafate  Nausea and vomiting: Acute.  - Still having intermittent nausea and vomiting - Zofran prn N/V and Reglan  Lactic acidosis : Acute.  - Improving - Suspect this could've been secondary to dehydration given recent nausea and vomiting vs. infection. - Continue with IV antibiotics  Essential hypertension - Monitor blood pressure. - Blood pressure is on the higher side currently. When necessary hydralazine and metoprolol IV - Restart home blood pressure medications when tolerating po  Hypomagnesemia Improved; repeat a.m.  labs    DVT prophylaxis:Lovenox Code Status:Full Family Communication:None present at bedside Disposition Plan:Home probably in 1-2 days if condition improves  Consultants:None  Procedures:None  Antimicrobials: Ciprofloxacin and Flagyl from 06/12/2016 Subjective: Patient seen and examined at bedside. She still intermittently nauseous and had 1 episode of vomiting this morning. No overnight fevers  Objective: Vitals:   06/14/16 2036 06/14/16 2225 06/15/16 0532 06/15/16 0649  BP: (!) 174/75 (!) 142/59 (!) 189/94 (!) 160/82  Pulse: 94  93 79  Resp: (!) 22  20   Temp: 97.6 F (36.4 C)  97.4 F (36.3 C)   TempSrc: Oral  Oral   SpO2: 99%  96%   Weight:      Height:        Intake/Output Summary (Last 24 hours) at 06/15/16 1315 Last data filed at 06/15/16 1000  Gross per 24 hour  Intake             3125 ml  Output              375 ml  Net             2750 ml   Filed Weights   06/12/16 2200 06/13/16 0414  Weight: 77.1 kg (170 lb) 78.1 kg (172 lb 3.2 oz)    Examination:  General exam: Appears calm and comfortable  Respiratory system: Bilateral decreased breath sound at bases  Cardiovascular system: S1 & S2 heard,Rate controlled  Gastrointestinal system: Abdomen is nondistended, soft and mild tenderness in the epigastric and umbilical region. Bowel sounds sluggish. Extremities: No cyanosis, clubbing, edema   Data Reviewed: I have personally reviewed following labs and imaging studies  CBC:  Recent Labs Lab 06/12/16 2223 06/13/16 0552 06/14/16 0340  06/15/16 0456  WBC 9.1 8.4 7.2 8.8  NEUTROABS 7.6  --  5.7 7.1  HGB 12.3 11.2* 10.6* 11.6*  HCT 37.9 34.9* 33.6* 35.7*  MCV 84.4 83.9 84.4 83.2  PLT 231 210 191 737   Basic Metabolic Panel:  Recent Labs Lab 06/12/16 2223 06/13/16 0552 06/14/16 0340 06/15/16 0456  NA 136 139 137 132*  K 4.5 4.1 3.8 3.5  CL 101 105 107 102  CO2 25 26 23  21*  GLUCOSE 153* 131* 118* 127*  BUN 14 10 7 7    CREATININE 1.04* 0.91 0.87 0.80  CALCIUM 9.5 8.9 8.9 8.8*  MG  --   --  1.5* 1.7   GFR: Estimated Creatinine Clearance: 53.1 mL/min (by C-G formula based on SCr of 0.8 mg/dL). Liver Function Tests:  Recent Labs Lab 06/12/16 2223 06/14/16 0340  AST 34 23  ALT 28 19  ALKPHOS 52 38  BILITOT 0.4 0.6  PROT 7.8 6.5  ALBUMIN 3.9 3.1*    Recent Labs Lab 06/12/16 2223  LIPASE 39   No results for input(s): AMMONIA in the last 168 hours. Coagulation Profile: No results for input(s): INR, PROTIME in the last 168 hours. Cardiac Enzymes: No results for input(s): CKTOTAL, CKMB, CKMBINDEX, TROPONINI in the last 168 hours. BNP (last 3 results) No results for input(s): PROBNP in the last 8760 hours. HbA1C: No results for input(s): HGBA1C in the last 72 hours. CBG: No results for input(s): GLUCAP in the last 168 hours. Lipid Profile: No results for input(s): CHOL, HDL, LDLCALC, TRIG, CHOLHDL, LDLDIRECT in the last 72 hours. Thyroid Function Tests: No results for input(s): TSH, T4TOTAL, FREET4, T3FREE, THYROIDAB in the last 72 hours. Anemia Panel: No results for input(s): VITAMINB12, FOLATE, FERRITIN, TIBC, IRON, RETICCTPCT in the last 72 hours. Sepsis Labs:  Recent Labs Lab 06/13/16 0029 06/13/16 0332  LATICACIDVEN 3.91* 2.98*    No results found for this or any previous visit (from the past 240 hour(s)).       Radiology Studies: Dg Abd Portable 1v  Result Date: 06/14/2016 CLINICAL DATA:  Abdominal distention and pain EXAM: PORTABLE ABDOMEN - 1 VIEW COMPARISON:  None. FINDINGS: There is gaseous distention of the stomach. There is no bowel dilatation to suggest obstruction. There is no evidence of pneumoperitoneum, portal venous gas or pneumatosis. There are no pathologic calcifications along the expected course of the ureters. The osseous structures are unremarkable. IMPRESSION: 1. Isolated gaseous distension of the stomach. Otherwise no evidence of bowel obstruction.  Electronically Signed   By: Kathreen Devoid   On: 06/14/2016 10:37        Scheduled Meds: . enoxaparin (LOVENOX) injection  40 mg Subcutaneous Q24H  . pantoprazole (PROTONIX) IV  40 mg Intravenous Q12H  . sucralfate  1 g Oral TID WC & HS   Continuous Infusions: . sodium chloride 50 mL/hr at 06/15/16 0927  . ciprofloxacin Stopped (06/15/16 0919)  . metronidazole 500 mg (06/15/16 1311)     LOS: 1 day        Aline August, MD [facility] Pager [phone]  If 7PM-7AM, please contact night-coverage [email or web address] Password [email or web address] 06/15/2016, 1:15 PM

## 2016-06-16 ENCOUNTER — Inpatient Hospital Stay (HOSPITAL_COMMUNITY): Payer: Medicare Other

## 2016-06-16 LAB — BASIC METABOLIC PANEL
Anion gap: 10 (ref 5–15)
BUN: 12 mg/dL (ref 6–20)
CALCIUM: 8.7 mg/dL — AB (ref 8.9–10.3)
CHLORIDE: 99 mmol/L — AB (ref 101–111)
CO2: 20 mmol/L — ABNORMAL LOW (ref 22–32)
CREATININE: 1.05 mg/dL — AB (ref 0.44–1.00)
GFR calc Af Amer: 55 mL/min — ABNORMAL LOW (ref >60)
GFR calc non Af Amer: 47 mL/min — ABNORMAL LOW (ref >60)
Glucose, Bld: 142 mg/dL — ABNORMAL HIGH (ref 65–99)
Potassium: 3.7 mmol/L (ref 3.5–5.1)
SODIUM: 129 mmol/L — AB (ref 135–145)

## 2016-06-16 LAB — MAGNESIUM: MAGNESIUM: 1.5 mg/dL — AB (ref 1.7–2.4)

## 2016-06-16 LAB — CBC WITH DIFFERENTIAL/PLATELET
BASOS PCT: 0 %
Basophils Absolute: 0 10*3/uL (ref 0.0–0.1)
Eosinophils Absolute: 0 10*3/uL (ref 0.0–0.7)
Eosinophils Relative: 0 %
HCT: 37.3 % (ref 36.0–46.0)
HEMOGLOBIN: 12.1 g/dL (ref 12.0–15.0)
LYMPHS ABS: 0.7 10*3/uL (ref 0.7–4.0)
Lymphocytes Relative: 10 %
MCH: 26.8 pg (ref 26.0–34.0)
MCHC: 32.4 g/dL (ref 30.0–36.0)
MCV: 82.7 fL (ref 78.0–100.0)
MONO ABS: 0.4 10*3/uL (ref 0.1–1.0)
Monocytes Relative: 6 %
NEUTROS ABS: 6.2 10*3/uL (ref 1.7–7.7)
Neutrophils Relative %: 84 %
PLATELETS: 204 10*3/uL (ref 150–400)
RBC: 4.51 MIL/uL (ref 3.87–5.11)
RDW: 15 % (ref 11.5–15.5)
WBC: 7.3 10*3/uL (ref 4.0–10.5)

## 2016-06-16 MED ORDER — MAGNESIUM SULFATE 2 GM/50ML IV SOLN
2.0000 g | Freq: Once | INTRAVENOUS | Status: AC
  Administered 2016-06-16: 2 g via INTRAVENOUS
  Filled 2016-06-16: qty 50

## 2016-06-16 MED ORDER — FUROSEMIDE 10 MG/ML IJ SOLN
60.0000 mg | Freq: Once | INTRAMUSCULAR | Status: AC
  Administered 2016-06-16: 60 mg via INTRAVENOUS
  Filled 2016-06-16: qty 6

## 2016-06-16 MED ORDER — FUROSEMIDE 10 MG/ML IJ SOLN
INTRAMUSCULAR | Status: AC
Start: 2016-06-16 — End: 2016-06-16
  Filled 2016-06-16: qty 4

## 2016-06-16 NOTE — Consult Note (Addendum)
Reason for Consult: SBO Referring Physician: [facility]  Sheldon Silvan HPI: This is an 83 year old female admitted for a small bowel obstruction.  Initially it appears that she had an ileitis and a follow up KUB suggested improvement.  Subsequently she was advanced on a clear liquid diet, which she did not tolerate very well  A repeat KUB was more definitive for a SBO and she was treated with IV cipro and metronidazole for her ileitis.  Her last EGD/Colonoscopy was on 04/16/2012 for complaints of diarrhea.  Her diarrhea resolved with cessation of using Dexilant and diclofenac.  The EGD/Colonoscopy was negative for any pathology, but she is s/p right hemicolectomy.  Since that time she has not been to the office.  Today, during my evaluation she reports a near resolution of her symptoms.  She was able to have 3 bowel movements and she feels much better.  Her abdomen is softer and she denies any pain, but there is some mild soreness.  Past Medical History:  Diagnosis Date  . Allergy    SEASONAL  . Anemia   . Anxiety   . Arthritis    KNEES/HANDS  . 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 Blind loop syndrome   . Complication of anesthesia    pt can't remember exact complication, but she remembers about 30 years ago she may have had trouble waking up from anesthesia.  . Fatty liver   . GERD (gastroesophageal reflux disease)   . Hiatal hernia   . Hypertension   . MVP (mitral valve prolapse)   . PVC's (premature ventricular contractions)   . Schatzki's ring   . Small bowel obstruction ([place])   . Vitamin B12 deficiency     Past Surgical History:  Procedure Laterality Date  . CATARACT EXTRACTION, BILATERAL  03-26-11  . HEMICOLECTOMY     and ileectomy Dr Vida Rigger; due to perforated viscus  . I&D KNEE WITH POLY EXCHANGE  04/03/2011   Procedure: IRRIGATION AND DEBRIDEMENT KNEE WITH POLY EXCHANGE;  Surgeon: Mcarthur Rossetti, MD;  Location: [facility];  Service: Orthopedics;   Laterality: Right;  . KNEE CLOSED REDUCTION  01/22/2011   Procedure: CLOSED MANIPULATION KNEE;  Surgeon: Mcarthur Rossetti;  Location: [facility];  Service: Orthopedics;  Laterality: Right;  Manipulation under anesthesia right knee  . Nuclear stress test  Sept 2012   Normal  . REPLACEMENT TOTAL KNEE     right  . ROTATOR CUFF REPAIR  03-26-11   right arm  . SYNOVECTOMY  04/03/2011   Procedure: SYNOVECTOMY;  Surgeon: Mcarthur Rossetti, MD;  Location: [facility];  Service: Orthopedics;  Laterality: Right;  . TOTAL KNEE ARTHROPLASTY Left 11/13/2014   Procedure: TOTAL KNEE ARTHROPLASTY;  Surgeon: Paralee Cancel, MD;  Location: [facility];  Service: Orthopedics;  Laterality: Left;  . TOTAL KNEE REVISION Right 07/14/2012   Procedure: RIGHT TOTAL KNEE REVISION;  Surgeon: Mauri Pole, MD;  Location: [facility];  Service: Orthopedics;  Laterality: Right;  . TUBAL LIGATION      Family History  Problem Relation Age of Onset  . Hypertension Sister   . Diabetes Sister   . Diabetes Brother   . Coronary artery disease Brother   . Deep vein thrombosis Sister   . Colon cancer Neg Hx   . Colon polyps Neg Hx     Social History:  reports that she has never smoked. She has never used smokeless tobacco. She reports that she does not drink alcohol  or use drugs.  Allergies: No Known Allergies  Medications:  Scheduled: . enoxaparin (LOVENOX) injection  40 mg Subcutaneous Q24H  . pantoprazole (PROTONIX) IV  40 mg Intravenous Q12H  . sucralfate  1 g Oral TID WC & HS   Continuous: . sodium chloride 50 mL/hr at 06/16/16 0341  . ciprofloxacin Stopped (06/16/16 0900)  . metronidazole Stopped (06/16/16 5573)    Results for orders placed or performed during the hospital encounter of 06/12/16 (from the past 24 hour(s))  CBC with Differential/Platelet     Status: None   Collection Time: 06/16/16  5:06 AM  Result Value Ref Range   WBC 7.3 4.0 - 10.5 K/uL   RBC 4.51 3.87 - 5.11 MIL/uL   Hemoglobin 12.1 12.0 - 15.0  g/dL   HCT 37.3 36.0 - 46.0 %   MCV 82.7 78.0 - 100.0 fL   MCH 26.8 26.0 - 34.0 pg   MCHC 32.4 30.0 - 36.0 g/dL   RDW 15.0 11.5 - 15.5 %   Platelets 204 150 - 400 K/uL   Neutrophils Relative % 84 %   Lymphocytes Relative 10 %   Monocytes Relative 6 %   Eosinophils Relative 0 %   Basophils Relative 0 %   Neutro Abs 6.2 1.7 - 7.7 K/uL   Lymphs Abs 0.7 0.7 - 4.0 K/uL   Monocytes Absolute 0.4 0.1 - 1.0 K/uL   Eosinophils Absolute 0.0 0.0 - 0.7 K/uL   Basophils Absolute 0.0 0.0 - 0.1 K/uL  Basic metabolic panel     Status: Abnormal   Collection Time: 06/16/16  5:06 AM  Result Value Ref Range   Sodium 129 (L) 135 - 145 mmol/L   Potassium 3.7 3.5 - 5.1 mmol/L   Chloride 99 (L) 101 - 111 mmol/L   CO2 20 (L) 22 - 32 mmol/L   Glucose, Bld 142 (H) 65 - 99 mg/dL   BUN 12 6 - 20 mg/dL   Creatinine, Ser 1.05 (H) 0.44 - 1.00 mg/dL   Calcium 8.7 (L) 8.9 - 10.3 mg/dL   GFR calc non Af Amer 47 (L) >60 mL/min   GFR calc Af Amer 55 (L) >60 mL/min   Anion gap 10 5 - 15  Magnesium     Status: Abnormal   Collection Time: 06/16/16  5:06 AM  Result Value Ref Range   Magnesium 1.5 (L) 1.7 - 2.4 mg/dL     Dg Abd 1 View  Result Date: 06/16/2016 CLINICAL DATA:  Abdominal pain and distention. EXAM: ABDOMEN - 1 VIEW COMPARISON:  06/15/2016 and 06/14/2016 and CT scan dated 06/13/2016 FINDINGS: There is persistent dilatation of small bowel loops in the mid abdomen. The colon is not distended. Surgical staples in the right lower quadrant. No visible free air or free fluid on the supine radiograph. IMPRESSION: Persistent dilatation of small bowel. The possibility of partial small bowel obstruction should be considered. Electronically Signed   By: Lorriane Shire M.D.   On: 06/16/2016 10:15   Dg Abd 1 View  Result Date: 06/15/2016 CLINICAL DATA:  Nausea, vomiting, constipation, abdominal distention, prior small bowel obstruction with resection EXAM: ABDOMEN - 1 VIEW COMPARISON:  Abdominal radiograph dated  06/14/2016. CT abdomen/ pelvis dated 06/13/2016. FINDINGS: Mild gaseous distension of the stomach. Mildly prominent loops of small bowel in the right mid/ lower abdomen. Surgical sutures in the right lower quadrant. Degenerative changes the lower lumbar spine. IMPRESSION: Mild gaseous distension of the stomach and small bowel in the right mid/lower abdomen. Differential  considerations include partial small bowel obstruction or adynamic ileus. Electronically Signed   By: Julian Hy M.D.   On: 06/15/2016 13:40   Dg Chest Port 1 View  Result Date: 06/15/2016 CLINICAL DATA:  Hypertension, small-bowel obstruction, GERD, 2 days of generalized abdominal pain with intermittent nausea and vomiting, history hypertension and fatty liver EXAM: PORTABLE CHEST 1 VIEW COMPARISON:  Portable exam 1532 hours compared to 07/05/2012 FINDINGS: Upper normal size of cardiac silhouette. Atherosclerotic calcification aorta. Mediastinal contours and pulmonary vascularity normal. Elevation of RIGHT diaphragm with RIGHT basilar atelectasis. Remaining lungs clear. No pleural effusion or pneumothorax. Bones demineralized. IMPRESSION: RIGHT basilar atelectasis. Aortic atherosclerosis. Electronically Signed   By: Lavonia Dana M.D.   On: 06/15/2016 15:50    ROS:  As stated above in the HPI otherwise negative.  Blood pressure (!) 184/96, pulse 99, temperature 98.8 F (37.1 C), temperature source Oral, resp. rate 20, height 5\' 5"  (1.651 m), weight 78.1 kg (172 lb 3.2 oz), SpO2 98 %.    PE: Gen: NAD, Alert and Oriented HEENT:  [place]/AT, EOMI Neck: Supple, no LAD Lungs: CTA Bilaterally CV: RRR without M/G/R ABM: Soft, NTND, +BS Ext: No C/C/E  Assessment/Plan: 1) SBO.   Surgical clips noted in the RLQ and she has persistent dilation of her mid abdominal loops.  It may be that she has a SBO secondary to adhesions from her prior right hemicolectomy.  It appears that her symptoms have spontaneously resolved.  She feels much  better and she does not require any further treatment at this time.  If she has a recurrence of her symptoms, she will require a NG tube.  Plan: 1) Clear liquid diet and then advance as tolerated. 2) Agree with KUB in the AM.  Yoav Okane D 06/16/2016, 1:26 PM

## 2016-06-16 NOTE — Progress Notes (Signed)
Patient ID: [name] [name], female   DOB: December 06, 1928, 83 y.o.   MRN: [id number]  PROGRESS NOTE    CHIZUKO TRINE  TDD:[id number] DOB: 03/22/1928 DOA: 06/12/2016 PCP: [name] Baton, MD   Brief Narrative:  83 y.o.femalewith medical history significant ofHTN, SBO, generalized anxiety disorder,GERD; who presented with complaints of generalized abdominal pain. She was found to have dietitian of small bowel loops with distal ileum wall thickening. She was admitted onIV antibiotics. She is still complaining of abdominal pain with intermittent nausea and vomiting. Her symptoms are not improving.  Assessment & Plan:   Principal Problem:   Ileitis Active Problems:   Essential hypertension   Generalized anxiety disorder   Nausea and vomiting   Generalized abdominal pain   Ileus ([facility])  Generalized abd pain, Suspected Ileitis with dilated small bowel loops: Acute. - Patient has been on clear liquid diet  which she hasn't tolerated that well. She still intermittently nauseous with vomiting this morning. Repeat x-ray abdomen yesterday was suggestive of partial small bowel obstruction versus ileus. Repeat x-ray abdomen today. General surgery consultation called. Continue antiemetics and IV analgesics. - Continue empiric IV metronidazole and ciprofloxacin. Switch to oral antibiotics once tolerates diet - Switch to oral pain medications once starts tolerating diet. - If symptoms persist, will probably repeat CAT scan  - Continue with Protonixand Maalox and Carafate  - Paged GI for consultation  Nausea and vomiting: Acute.  - Still having intermittent nausea and vomiting - Zofran prn N/V and Reglan  Shortness of breath: Subjective - Chest x-ray from yesterday was unremarkable. - Patient feels short of breath probably because of abdominal distention - She received 1 dose of Lasix yesterday. I will order one more dose of Lasix today  Lactic acidosis : Acute.  - Improving - Suspect this  could've been secondary to dehydration given recent nausea and vomiting vs. infection. - Continue with IV antibiotics  Essential hypertension - Monitor blood pressure. - Blood pressure is on the higher side currently. When necessary hydralazine and metoprolol IV - Restart home blood pressure medications when tolerating po  Hypomagnesemia Replace ; repeat a.m. labs  Subjective: Patient seen and examined at bedside. She is slightly short of breath and feels that something is stuck in her throat. She complains of intermittent nausea and abdominal pain.  Objective: Vitals:   06/15/16 1408 06/15/16 1500 06/15/16 1951 06/15/16 2200  BP: (!) 199/93 135/76 (!) 155/73 (!) 184/96  Pulse: 84 92 (!) 102 99  Resp: 20  20 20   Temp: 97.7 F (36.5 C)  97.9 F (36.6 C) 98.8 F (37.1 C)  TempSrc: Oral  Oral Oral  SpO2: 98%  98% 98%  Weight:      Height:        Intake/Output Summary (Last 24 hours) at 06/16/16 0953 Last data filed at 06/16/16 0900  Gross per 24 hour  Intake          2140.83 ml  Output              600 ml  Net          1540.83 ml   Filed Weights   06/12/16 2200 06/13/16 0414  Weight: 77.1 kg (170 lb) 78.1 kg (172 lb 3.2 oz)    Examination:  General exam: Appears calm and comfortable  Respiratory system: Bilateral decreased breath sound at bases With some scattered crackles Cardiovascular system: S1 & S2 heard, rate controlled  Gastrointestinal system: Abdomen is distended, soft and mild tenderness in  the epigastric and umbilical region. Bowel sounds sluggish  Central nervous system: Alert and oriented. No focal neurological deficits. Moving extremities Extremities: No cyanosis, clubbing, edema  Skin: No rashes, lesions or ulcers Psychiatry: Judgement and insight appear normal. Mood & affect appropriate.     Data Reviewed: I have personally reviewed following labs and imaging studies  CBC:  Recent Labs Lab 06/12/16 2223 06/13/16 0552 06/14/16 0340  06/15/16 0456 06/16/16 0506  WBC 9.1 8.4 7.2 8.8 7.3  NEUTROABS 7.6  --  5.7 7.1 6.2  HGB 12.3 11.2* 10.6* 11.6* 12.1  HCT 37.9 34.9* 33.6* 35.7* 37.3  MCV 84.4 83.9 84.4 83.2 82.7  PLT 231 210 191 198 007   Basic Metabolic Panel:  Recent Labs Lab 06/12/16 2223 06/13/16 0552 06/14/16 0340 06/15/16 0456 06/16/16 0506  NA 136 139 137 132* 129*  K 4.5 4.1 3.8 3.5 3.7  CL 101 105 107 102 99*  CO2 25 26 23  21* 20*  GLUCOSE 153* 131* 118* 127* 142*  BUN 14 10 7 7 12   CREATININE 1.04* 0.91 0.87 0.80 1.05*  CALCIUM 9.5 8.9 8.9 8.8* 8.7*  MG  --   --  1.5* 1.7 1.5*   GFR: Estimated Creatinine Clearance: 40.4 mL/min (A) (by C-G formula based on SCr of 1.05 mg/dL (H)). Liver Function Tests:  Recent Labs Lab 06/12/16 2223 06/14/16 0340  AST 34 23  ALT 28 19  ALKPHOS 52 38  BILITOT 0.4 0.6  PROT 7.8 6.5  ALBUMIN 3.9 3.1*    Recent Labs Lab 06/12/16 2223  LIPASE 39   No results for input(s): AMMONIA in the last 168 hours. Coagulation Profile: No results for input(s): INR, PROTIME in the last 168 hours. Cardiac Enzymes: No results for input(s): CKTOTAL, CKMB, CKMBINDEX, TROPONINI in the last 168 hours. BNP (last 3 results) No results for input(s): PROBNP in the last 8760 hours. HbA1C: No results for input(s): HGBA1C in the last 72 hours. CBG: No results for input(s): GLUCAP in the last 168 hours. Lipid Profile: No results for input(s): CHOL, HDL, LDLCALC, TRIG, CHOLHDL, LDLDIRECT in the last 72 hours. Thyroid Function Tests: No results for input(s): TSH, T4TOTAL, FREET4, T3FREE, THYROIDAB in the last 72 hours. Anemia Panel: No results for input(s): VITAMINB12, FOLATE, FERRITIN, TIBC, IRON, RETICCTPCT in the last 72 hours. Sepsis Labs:  Recent Labs Lab 06/13/16 0029 06/13/16 0332  LATICACIDVEN 3.91* 2.98*    No results found for this or any previous visit (from the past 240 hour(s)).       Radiology Studies: Dg Abd 1 View  Result Date:  06/15/2016 CLINICAL DATA:  Nausea, vomiting, constipation, abdominal distention, prior small bowel obstruction with resection EXAM: ABDOMEN - 1 VIEW COMPARISON:  Abdominal radiograph dated 06/14/2016. CT abdomen/ pelvis dated 06/13/2016. FINDINGS: Mild gaseous distension of the stomach. Mildly prominent loops of small bowel in the right mid/ lower abdomen. Surgical sutures in the right lower quadrant. Degenerative changes the lower lumbar spine. IMPRESSION: Mild gaseous distension of the stomach and small bowel in the right mid/lower abdomen. Differential considerations include partial small bowel obstruction or adynamic ileus. Electronically Signed   By: Julian Hy M.D.   On: 06/15/2016 13:40   Dg Chest Port 1 View  Result Date: 06/15/2016 CLINICAL DATA:  Hypertension, small-bowel obstruction, GERD, 2 days of generalized abdominal pain with intermittent nausea and vomiting, history hypertension and fatty liver EXAM: PORTABLE CHEST 1 VIEW COMPARISON:  Portable exam 1532 hours compared to 07/05/2012 FINDINGS: Upper normal size of cardiac  silhouette. Atherosclerotic calcification aorta. Mediastinal contours and pulmonary vascularity normal. Elevation of RIGHT diaphragm with RIGHT basilar atelectasis. Remaining lungs clear. No pleural effusion or pneumothorax. Bones demineralized. IMPRESSION: RIGHT basilar atelectasis. Aortic atherosclerosis. Electronically Signed   By: Lavonia Dana M.D.   On: 06/15/2016 15:50   Dg Abd Portable 1v  Result Date: 06/14/2016 CLINICAL DATA:  Abdominal distention and pain EXAM: PORTABLE ABDOMEN - 1 VIEW COMPARISON:  None. FINDINGS: There is gaseous distention of the stomach. There is no bowel dilatation to suggest obstruction. There is no evidence of pneumoperitoneum, portal venous gas or pneumatosis. There are no pathologic calcifications along the expected course of the ureters. The osseous structures are unremarkable. IMPRESSION: 1. Isolated gaseous distension of the  stomach. Otherwise no evidence of bowel obstruction. Electronically Signed   By: Kathreen Devoid   On: 06/14/2016 10:37        Scheduled Meds: . enoxaparin (LOVENOX) injection  40 mg Subcutaneous Q24H  . pantoprazole (PROTONIX) IV  40 mg Intravenous Q12H  . sucralfate  1 g Oral TID WC & HS   Continuous Infusions: . sodium chloride 50 mL/hr at 06/16/16 0341  . ciprofloxacin 400 mg (06/16/16 0800)  . magnesium sulfate 1 - 4 g bolus IVPB 2 g (06/16/16 0950)  . metronidazole Stopped (06/16/16 0723)     LOS: 2 days        Aline August, MD [facility] Pager [phone]  If 7PM-7AM, please contact night-coverage [email or web address] Password [facility] 06/16/2016, 9:53 AM

## 2016-06-16 NOTE — Consult Note (Signed)
[facility] Surgery Consult Note  [name] [name] Aug 02, 1928  [id number].    Requesting MD: Starla Link, MD Chief Complaint/Reason for Consult: ileitis  HPI:  [name] [name] is an 83 y.o. Female with a PMH HTN, SBO (02/2016), anxiety and gastric reflux who presented to [facility] with abdominal pain, associated with nausea and vomiting, on 06/12/16. ED workup significant for dilated small bowel loops and ileitis of the distal ileum. She has been started on IV antibiotics (Cipro/flagyl) and clear liquid diet with minimal relief of symptoms. General surgery has been asked to consult.   Today patient tells me that this is her third admission with symptoms of SBO. All previous admissions resolved without surgical intervention. She states that on Friday she experienced acute abdominal pain associated with nausea and vomiting. This pain is similar to previous pain associated with SBO. Since admission she endorses abdominal bloating and nausea. Last episode of vomiting was yesterday. She denies flatus. Endorses passing one, hard stool ball 2 days ago but no BM since. She has a remote history (> 40 years ago) of Harmann's procedure for diverticulitis followed by colostomy reversal. She denies any other abdominal surgeries. Denies personal history of colon cancer - see Dr. Benson Norway. Denies use of blood thinning medications.  Today's labs: WBC 7.3, hgb/hct WNL, Creatinine 1.05, Blood glucose 142, hyponatremia (129), chloride 99, CO2 20. Lactic acidosis improving.   ROS: Review of Systems  Constitutional: Negative for chills, fever and weight loss.  Gastrointestinal: Positive for abdominal pain, nausea and vomiting.  All other systems reviewed and are negative.   Family History  Problem Relation Age of Onset  . Hypertension Sister   . Diabetes Sister   . Diabetes Brother   . Coronary artery disease Brother   . Deep vein thrombosis Sister   . Colon cancer Neg Hx   . Colon polyps Neg Hx     Past Medical History:   Diagnosis Date  . Allergy    SEASONAL  . Anemia   . Anxiety   . Arthritis    KNEES/HANDS  . 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 Blind loop syndrome   . Complication of anesthesia    pt can't remember exact complication, but she remembers about 30 years ago she may have had trouble waking up from anesthesia.  . Fatty liver   . GERD (gastroesophageal reflux disease)   . Hiatal hernia   . Hypertension   . MVP (mitral valve prolapse)   . PVC's (premature ventricular contractions)   . Schatzki's ring   . Small bowel obstruction ([place])   . Vitamin B12 deficiency     Past Surgical History:  Procedure Laterality Date  . CATARACT EXTRACTION, BILATERAL  03-26-11  . HEMICOLECTOMY     and ileectomy Dr Vida Rigger; due to perforated viscus  . I&D KNEE WITH POLY EXCHANGE  04/03/2011   Procedure: IRRIGATION AND DEBRIDEMENT KNEE WITH POLY EXCHANGE;  Surgeon: Mcarthur Rossetti, MD;  Location: [facility];  Service: Orthopedics;  Laterality: Right;  . KNEE CLOSED REDUCTION  01/22/2011   Procedure: CLOSED MANIPULATION KNEE;  Surgeon: Mcarthur Rossetti;  Location: [facility];  Service: Orthopedics;  Laterality: Right;  Manipulation under anesthesia right knee  . Nuclear stress test  Sept 2012   Normal  . REPLACEMENT TOTAL KNEE     right  . ROTATOR CUFF REPAIR  03-26-11   right arm  . SYNOVECTOMY  04/03/2011   Procedure: SYNOVECTOMY;  Surgeon: Mcarthur Rossetti, MD;  Location: [facility];  Service: Orthopedics;  Laterality: Right;  . TOTAL KNEE ARTHROPLASTY Left 11/13/2014   Procedure: TOTAL KNEE ARTHROPLASTY;  Surgeon: Paralee Cancel, MD;  Location: [facility];  Service: Orthopedics;  Laterality: Left;  . TOTAL KNEE REVISION Right 07/14/2012   Procedure: RIGHT TOTAL KNEE REVISION;  Surgeon: Mauri Pole, MD;  Location: [facility];  Service: Orthopedics;  Laterality: Right;  . TUBAL LIGATION      Social History:  reports that she has never smoked. She has never used smokeless tobacco. She  reports that she does not drink alcohol or use drugs.  Allergies: No Known Allergies  Medications Prior to Admission  Medication Sig Dispense Refill  . acetaminophen (TYLENOL) 500 MG tablet Take 1,000 mg by mouth every 6 (six) hours as needed for moderate pain.    Marland Kitchen ALPRAZolam (XANAX) 0.25 MG tablet Take 1 tablet (0.25 mg total) by mouth 4 (four) times daily as needed. ANXIETY/ SPASM (Patient taking differently: Take 0.25 mg by mouth 4 (four) times daily as needed for anxiety. ) 30 tablet 0  . amLODipine (NORVASC) 5 MG tablet Take 5 mg by mouth every morning.     . fish oil-omega-3 fatty acids 1000 MG capsule Take 1 g by mouth every morning.     . loratadine (CLARITIN) 10 MG tablet Take 10 mg by mouth daily as needed for allergies.     . polyethylene glycol (MIRALAX) packet Take 17 g by mouth daily as needed for mild constipation. 30 each 1  . traMADol (ULTRAM) 50 MG tablet Take 1 tablet (50 mg total) by mouth every 6 (six) hours as needed for moderate pain.  Every 6-8 hours as needed for pain    . Vitamin D, Ergocalciferol, (DRISDOL) 50000 UNITS CAPS Take 50,000 Units by mouth every 7 (seven) days. Friday    . VOLTAREN 1 % GEL Apply 2 g topically 3 (three) times daily as needed (pain). To right knee as needed for pain  1    Blood pressure (!) 184/96, pulse 99, temperature 98.8 F (37.1 C), temperature source Oral, resp. rate 20, height '5\' 5"'$  (1.651 m), weight 78.1 kg (172 lb 3.2 oz), SpO2 98 %. Physical Exam: Physical Exam  Constitutional: She is oriented to person, place, and time. She appears well-developed and well-nourished. No distress.  HENT:  Head: Normocephalic and atraumatic.  Eyes: EOM are normal. Pupils are equal, round, and reactive to light. Right eye exhibits no discharge. Left eye exhibits no discharge.  Neck: Normal range of motion. Neck supple. No tracheal deviation present. No thyromegaly present.  Cardiovascular: Normal rate, regular rhythm, normal heart sounds and  intact distal pulses.  Exam reveals no gallop and no friction rub.   No murmur heard. Pulmonary/Chest: Effort normal and breath sounds normal. No respiratory distress. She has no wheezes. She has no rales.  Abdominal: Soft. Bowel sounds are normal. She exhibits distension. She exhibits no mass. There is no tenderness. There is no rebound and no guarding.  Previous laparotomy and LLQ colostomy scars noted  Musculoskeletal: Normal range of motion. She exhibits no edema or deformity.  Neurological: She is alert and oriented to person, place, and time. No sensory deficit.  Skin: Skin is warm and dry. She is not diaphoretic. No erythema.  Psychiatric: She has a normal mood and affect. Her behavior is normal.    Results for orders placed or performed during the hospital encounter of 06/12/16 (from the past 48 hour(s))  CBC with Differential/Platelet  Status: Abnormal   Collection Time: 06/15/16  4:56 AM  Result Value Ref Range   WBC 8.8 4.0 - 10.5 K/uL   RBC 4.29 3.87 - 5.11 MIL/uL   Hemoglobin 11.6 (L) 12.0 - 15.0 g/dL   HCT 19.0 (L) 70.7 - 21.7 %   MCV 83.2 78.0 - 100.0 fL   MCH 27.0 26.0 - 34.0 pg   MCHC 32.5 30.0 - 36.0 g/dL   RDW 11.6 54.6 - 12.4 %   Platelets 198 150 - 400 K/uL   Neutrophils Relative % 81 %   Neutro Abs 7.1 1.7 - 7.7 K/uL   Lymphocytes Relative 14 %   Lymphs Abs 1.2 0.7 - 4.0 K/uL   Monocytes Relative 5 %   Monocytes Absolute 0.5 0.1 - 1.0 K/uL   Eosinophils Relative 0 %   Eosinophils Absolute 0.0 0.0 - 0.7 K/uL   Basophils Relative 0 %   Basophils Absolute 0.0 0.0 - 0.1 K/uL  Basic metabolic panel     Status: Abnormal   Collection Time: 06/15/16  4:56 AM  Result Value Ref Range   Sodium 132 (L) 135 - 145 mmol/L   Potassium 3.5 3.5 - 5.1 mmol/L   Chloride 102 101 - 111 mmol/L   CO2 21 (L) 22 - 32 mmol/L   Glucose, Bld 127 (H) 65 - 99 mg/dL   BUN 7 6 - 20 mg/dL   Creatinine, Ser 3.27 0.44 - 1.00 mg/dL   Calcium 8.8 (L) 8.9 - 10.3 mg/dL   GFR calc non Af  Amer >60 >60 mL/min   GFR calc Af Amer >60 >60 mL/min    Comment: (NOTE) The eGFR has been calculated using the CKD EPI equation. This calculation has not been validated in all clinical situations. eGFR's persistently <60 mL/min signify possible Chronic Kidney Disease.    Anion gap 9 5 - 15  Magnesium     Status: None   Collection Time: 06/15/16  4:56 AM  Result Value Ref Range   Magnesium 1.7 1.7 - 2.4 mg/dL  CBC with Differential/Platelet     Status: None   Collection Time: 06/16/16  5:06 AM  Result Value Ref Range   WBC 7.3 4.0 - 10.5 K/uL   RBC 4.51 3.87 - 5.11 MIL/uL   Hemoglobin 12.1 12.0 - 15.0 g/dL   HCT 55.6 23.9 - 21.5 %   MCV 82.7 78.0 - 100.0 fL   MCH 26.8 26.0 - 34.0 pg   MCHC 32.4 30.0 - 36.0 g/dL   RDW 15.8 26.5 - 87.1 %   Platelets 204 150 - 400 K/uL   Neutrophils Relative % 84 %   Lymphocytes Relative 10 %   Monocytes Relative 6 %   Eosinophils Relative 0 %   Basophils Relative 0 %   Neutro Abs 6.2 1.7 - 7.7 K/uL   Lymphs Abs 0.7 0.7 - 4.0 K/uL   Monocytes Absolute 0.4 0.1 - 1.0 K/uL   Eosinophils Absolute 0.0 0.0 - 0.7 K/uL   Basophils Absolute 0.0 0.0 - 0.1 K/uL  Basic metabolic panel     Status: Abnormal   Collection Time: 06/16/16  5:06 AM  Result Value Ref Range   Sodium 129 (L) 135 - 145 mmol/L   Potassium 3.7 3.5 - 5.1 mmol/L   Chloride 99 (L) 101 - 111 mmol/L   CO2 20 (L) 22 - 32 mmol/L   Glucose, Bld 142 (H) 65 - 99 mg/dL   BUN 12 6 - 20 mg/dL   Creatinine,  Ser 1.05 (H) 0.44 - 1.00 mg/dL   Calcium 8.7 (L) 8.9 - 10.3 mg/dL   GFR calc non Af Amer 47 (L) >60 mL/min   GFR calc Af Amer 55 (L) >60 mL/min    Comment: (NOTE) The eGFR has been calculated using the CKD EPI equation. This calculation has not been validated in all clinical situations. eGFR's persistently <60 mL/min signify possible Chronic Kidney Disease.    Anion gap 10 5 - 15  Magnesium     Status: Abnormal   Collection Time: 06/16/16  5:06 AM  Result Value Ref Range    Magnesium 1.5 (L) 1.7 - 2.4 mg/dL   Dg Abd 1 View  Result Date: 06/16/2016 CLINICAL DATA:  Abdominal pain and distention. EXAM: ABDOMEN - 1 VIEW COMPARISON:  06/15/2016 and 06/14/2016 and CT scan dated 06/13/2016 FINDINGS: There is persistent dilatation of small bowel loops in the mid abdomen. The colon is not distended. Surgical staples in the right lower quadrant. No visible free air or free fluid on the supine radiograph. IMPRESSION: Persistent dilatation of small bowel. The possibility of partial small bowel obstruction should be considered. Electronically Signed   By: Lorriane Shire M.D.   On: 06/16/2016 10:15   Dg Abd 1 View  Result Date: 06/15/2016 CLINICAL DATA:  Nausea, vomiting, constipation, abdominal distention, prior small bowel obstruction with resection EXAM: ABDOMEN - 1 VIEW COMPARISON:  Abdominal radiograph dated 06/14/2016. CT abdomen/ pelvis dated 06/13/2016. FINDINGS: Mild gaseous distension of the stomach. Mildly prominent loops of small bowel in the right mid/ lower abdomen. Surgical sutures in the right lower quadrant. Degenerative changes the lower lumbar spine. IMPRESSION: Mild gaseous distension of the stomach and small bowel in the right mid/lower abdomen. Differential considerations include partial small bowel obstruction or adynamic ileus. Electronically Signed   By: Julian Hy M.D.   On: 06/15/2016 13:40   Dg Chest Port 1 View  Result Date: 06/15/2016 CLINICAL DATA:  Hypertension, small-bowel obstruction, GERD, 2 days of generalized abdominal pain with intermittent nausea and vomiting, history hypertension and fatty liver EXAM: PORTABLE CHEST 1 VIEW COMPARISON:  Portable exam 1532 hours compared to 07/05/2012 FINDINGS: Upper normal size of cardiac silhouette. Atherosclerotic calcification aorta. Mediastinal contours and pulmonary vascularity normal. Elevation of RIGHT diaphragm with RIGHT basilar atelectasis. Remaining lungs clear. No pleural effusion or pneumothorax.  Bones demineralized. IMPRESSION: RIGHT basilar atelectasis. Aortic atherosclerosis. Electronically Signed   By: Lavonia Dana M.D.   On: 06/15/2016 15:50   Assessment/Plan Ileitis vs SBO - immediately following my exam the patient had a large BM.  - recommend continuation of clear liquid diet, ambulate, and minimize/remove narcotic medications - repeat AXR in AM - persistent symptoms of obstruction may warrant nasogastric decompression and small bowel protocol.   General surgery will follow.   Jill Alexanders, [facility] Surgery 06/16/2016, 11:53 AM Pager: [phone] Consults: [phone] Mon-Fri 7:00 am-4:30 pm Sat-Sun 7:00 am-11:30 am

## 2016-06-16 NOTE — Evaluation (Signed)
Physical Therapy Evaluation Patient Details Name: [name] [name] MRN: [id number] DOB: 1928/07/17 Today's Date: 06/16/2016   History of Present Illness  83 y.o. female with medical history significant of HTN, SBO, generalized anxiety disorder, GERD; who presented with complaints of generalized abdominal pain and admitted for suspected Ileitis with dilated small bowel loops  Clinical Impression  Pt admitted with above diagnosis. Pt currently with functional limitations due to the deficits listed below (see PT Problem List). Pt will benefit from skilled PT to increase their independence and safety with mobility to allow discharge to the venue listed below.   Pt tolerated ambulating around unit well and reports she plans to d/c home alone however her daughter is able to stay with her if needed.  Recommended pt continue to use RW upon d/c until feeling at baseline.     Follow Up Recommendations No PT follow up    Equipment Recommendations  None recommended by PT    Recommendations for Other Services       Precautions / Restrictions Precautions Precautions: None Restrictions Weight Bearing Restrictions: No      Mobility  Bed Mobility               General bed mobility comments: pt up in recliner on arrival  Transfers Overall transfer level: Needs assistance Equipment used: Rolling walker (2 wheeled) Transfers: Sit to/from Stand Sit to Stand: Min guard         General transfer comment: slow to rise, requires use of UEs to self assist  Ambulation/Gait Ambulation/Gait assistance: Min guard Ambulation Distance (Feet): 400 Feet Assistive device: Rolling walker (2 wheeled) Gait Pattern/deviations: Step-through pattern;Decreased stride length;Trunk flexed     General Gait Details: verbal cues for safe use of RW  Stairs            Wheelchair Mobility    Modified Rankin (Stroke Patients Only)       Balance Overall balance assessment:  (denies falls)                                           Pertinent Vitals/Pain Pain Assessment: 0-10 Pain Score: 7  Pain Location: abdomen, throat  Pain Descriptors / Indicators: Sore Pain Intervention(s): Monitored during session;Limited activity within patient's tolerance;Repositioned (states she has received meds)    Home Living Family/patient expects to be discharged to:: Private residence Living Arrangements: Alone Available Help at Discharge: Family (daughter can stay if needed) Type of Home: House Home Access: Stairs to enter Entrance Stairs-Rails: Left Entrance Stairs-Number of Steps: 5 Home Layout: One level Home Equipment: Environmental consultant - 2 wheels      Prior Function Level of Independence: Independent         Comments: typically goes to [employer]        Extremity/Trunk Assessment        Lower Extremity Assessment Lower Extremity Assessment: Overall WFL for tasks assessed       Communication   Communication: No difficulties  Cognition Arousal/Alertness: Awake/alert Behavior During Therapy: WFL for tasks assessed/performed Overall Cognitive Status: Within Functional Limits for tasks assessed                                        General Comments      Exercises  Assessment/Plan    PT Assessment Patient needs continued PT services  PT Problem List Decreased strength;Decreased mobility;Decreased activity tolerance       PT Treatment Interventions DME instruction;Gait training;Therapeutic exercise;Therapeutic activities;Functional mobility training;Stair training;Patient/family education    PT Goals (Current goals can be found in the Care Plan section)  Acute Rehab PT Goals PT Goal Formulation: With patient Time For Goal Achievement: 06/23/16 Potential to Achieve Goals: Good    Frequency Min 3X/week   Barriers to discharge        Co-evaluation               AM-PAC PT "6 Clicks" Daily Activity  Outcome  Measure Difficulty turning over in bed (including adjusting bedclothes, sheets and blankets)?: None Difficulty moving from lying on back to sitting on the side of the bed? : None Difficulty sitting down on and standing up from a chair with arms (e.g., wheelchair, bedside commode, etc,.)?: A Little Help needed moving to and from a bed to chair (including a wheelchair)?: A Little Help needed walking in hospital room?: A Little Help needed climbing 3-5 steps with a railing? : A Little 6 Click Score: 20    End of Session   Activity Tolerance: Patient tolerated treatment well Patient left: in chair;with call bell/phone within reach Nurse Communication: Mobility status PT Visit Diagnosis: Difficulty in walking, not elsewhere classified (R26.2)    Time: 0912-0922 PT Time Calculation (min) (ACUTE ONLY): 10 min   Charges:   PT Evaluation $PT Eval Low Complexity: 1 Procedure     PT G CodesCarmelia Bake, PT, DPT 06/16/2016 Pager: [phone]   York Ram E 06/16/2016, 9:39 AM

## 2016-06-17 ENCOUNTER — Inpatient Hospital Stay (HOSPITAL_COMMUNITY): Payer: Medicare Other

## 2016-06-17 LAB — BASIC METABOLIC PANEL
ANION GAP: 4 — AB (ref 5–15)
BUN: 11 mg/dL (ref 6–20)
CALCIUM: 8.1 mg/dL — AB (ref 8.9–10.3)
CO2: 24 mmol/L (ref 22–32)
Chloride: 109 mmol/L (ref 101–111)
Creatinine, Ser: 1.01 mg/dL — ABNORMAL HIGH (ref 0.44–1.00)
GFR, EST AFRICAN AMERICAN: 57 mL/min — AB (ref >60)
GFR, EST NON AFRICAN AMERICAN: 49 mL/min — AB (ref >60)
Glucose, Bld: 94 mg/dL (ref 65–99)
Potassium: 3.1 mmol/L — ABNORMAL LOW (ref 3.5–5.1)
Sodium: 137 mmol/L (ref 135–145)

## 2016-06-17 MED ORDER — FAMOTIDINE IN NACL 20-0.9 MG/50ML-% IV SOLN
20.0000 mg | INTRAVENOUS | Status: DC
  Administered 2016-06-17 – 2016-06-18 (×2): 20 mg via INTRAVENOUS
  Filled 2016-06-17 (×3): qty 50

## 2016-06-17 MED ORDER — POTASSIUM CHLORIDE 10 MEQ/100ML IV SOLN
10.0000 meq | INTRAVENOUS | Status: AC
  Administered 2016-06-17 (×3): 10 meq via INTRAVENOUS
  Filled 2016-06-17 (×3): qty 100

## 2016-06-17 MED ORDER — DEXTROSE-NACL 5-0.45 % IV SOLN
INTRAVENOUS | Status: DC
  Administered 2016-06-17 – 2016-06-19 (×3): via INTRAVENOUS

## 2016-06-17 MED ORDER — DIATRIZOATE MEGLUMINE & SODIUM 66-10 % PO SOLN
90.0000 mL | Freq: Once | ORAL | Status: AC
  Administered 2016-06-17: 90 mL via NASOGASTRIC
  Filled 2016-06-17: qty 90

## 2016-06-17 NOTE — Progress Notes (Signed)
NGT placed as ordered, per SB protocol. Pt tol procedure well without issues. X- ray called verify location

## 2016-06-17 NOTE — Progress Notes (Signed)
PROGRESS NOTE [facility]   [name] [name]   SJG:[id number] DOB: [birth year]  DOA: 06/12/2016 PCP: [name], MD   Brief Narrative:  83 year old female with significant history of hypertension and history of SBO presented to the ED with acute abdominal pain. She was found to have a small bowel loop of distal ileum wall thickening consistent with ileus, she was started on empiric antibiotic and surgical team was consulted. Patient has been doing okay with conservative treatment although x-ray from today shows worsening of obstruction with abdominal distention. Patient started on small bowel protocol.  Subjective: Patient seen and examined, had complained this morning other than slight abdominal pain. She was tolerating diet well, had a bowel movement this morning. Denies nausea and vomiting  Assessment & Plan: Recurrent partial SBO Patient with history of premature SBO on February 2018 treated nonoperatively Also with history of intestinal resection with temporary colostomy which has been reversed Surgical recommendations appreciated Placed patient back nothing by mouth Small bowel protocol Awaiting for resolution without surgical intervention Patient was placed on empiric abx, will d/c I don't suspect infectious etiology, as there no signs of infection, normal WBC, no fever.   Essential hypertension Oral medications are on hold since patient is nothing by mouth We'll place patient on hydralazine when necessary for SBP above 165 Monitor BP   Anxiety - stable  Monitor for now   GERD  Will place on IV Pepcid   Hypokalemia Replace K Check BMP in AM   DVT prophylaxis: Lovenox Code Status: Full code Family Communication: None at bedside Disposition Plan: Home when medically stable  Consultants:   GI  Gen. surgery  Procedures:   None  Antimicrobials: Anti-infectives    Start     Dose/Rate Route Frequency Ordered Stop   06/13/16 0500  metroNIDAZOLE (FLAGYL)  IVPB 500 mg  Status:  Discontinued     500 mg 100 mL/hr over 60 Minutes Intravenous Every 8 hours 06/13/16 0437 06/17/16 1648   06/13/16 0500  ciprofloxacin (CIPRO) IVPB 400 mg  Status:  Discontinued     400 mg 200 mL/hr over 60 Minutes Intravenous 2 times daily 06/13/16 0438 06/17/16 1648        Objective: Vitals:   06/15/16 2200 06/16/16 2058 06/17/16 0433 06/17/16 1447  BP: (!) 184/96 (!) 149/72 (!) 143/75 (!) 157/63  Pulse: 99 94 87 84  Resp: 20 19 18 18   Temp: 98.8 F (37.1 C) 98.3 F (36.8 C) 98.4 F (36.9 C)   TempSrc: Oral Oral Oral Oral  SpO2: 98% 99% 98% 98%  Weight:      Height:        Intake/Output Summary (Last 24 hours) at 06/17/16 1635 Last data filed at 06/17/16 0522  Gross per 24 hour  Intake          1888.33 ml  Output              720 ml  Net          1168.33 ml   Filed Weights   06/12/16 2200 06/13/16 0414  Weight: 77.1 kg (170 lb) 78.1 kg (172 lb 3.2 oz)    Examination:  General exam: Appears calm and comfortable, Sitting up in chair Respiratory system: Clear to auscultation. No wheezes,crackle or rhonchi Cardiovascular system: S1 & S2 heard, RRR. No JVD, murmurs Gastrointestinal system: Abdomen soft, mild distended, diffuse mild tenderness to palpation, positive bowel sounds Central nervous system: Alert and oriented. No focal neurological deficits. Extremities: No pedal edema.  Skin: No rashes, lesions or ulcers Psychiatry: Judgement and insight appear normal. Mood & affect appropriate.    Data Reviewed: I have personally reviewed following labs and imaging studies  CBC:  Recent Labs Lab 06/12/16 2223 06/13/16 0552 06/14/16 0340 06/15/16 0456 06/16/16 0506  WBC 9.1 8.4 7.2 8.8 7.3  NEUTROABS 7.6  --  5.7 7.1 6.2  HGB 12.3 11.2* 10.6* 11.6* 12.1  HCT 37.9 34.9* 33.6* 35.7* 37.3  MCV 84.4 83.9 84.4 83.2 82.7  PLT 231 210 191 198 272   Basic Metabolic Panel:  Recent Labs Lab 06/13/16 0552 06/14/16 0340 06/15/16 0456  06/16/16 0506 06/17/16 0804  NA 139 137 132* 129* 137  K 4.1 3.8 3.5 3.7 3.1*  CL 105 107 102 99* 109  CO2 26 23 21* 20* 24  GLUCOSE 131* 118* 127* 142* 94  BUN 10 7 7 12 11   CREATININE 0.91 0.87 0.80 1.05* 1.01*  CALCIUM 8.9 8.9 8.8* 8.7* 8.1*  MG  --  1.5* 1.7 1.5*  --    GFR: Estimated Creatinine Clearance: 42 mL/min (A) (by C-G formula based on SCr of 1.01 mg/dL (H)). Liver Function Tests:  Recent Labs Lab 06/12/16 2223 06/14/16 0340  AST 34 23  ALT 28 19  ALKPHOS 52 38  BILITOT 0.4 0.6  PROT 7.8 6.5  ALBUMIN 3.9 3.1*    Recent Labs Lab 06/12/16 2223  LIPASE 39    Recent Labs Lab 06/13/16 0029 06/13/16 0332  LATICACIDVEN 3.91* 2.98*   Radiology Studies: Dg Abd 1 View  Result Date: 06/16/2016 CLINICAL DATA:  Abdominal pain and distention. EXAM: ABDOMEN - 1 VIEW COMPARISON:  06/15/2016 and 06/14/2016 and CT scan dated 06/13/2016 FINDINGS: There is persistent dilatation of small bowel loops in the mid abdomen. The colon is not distended. Surgical staples in the right lower quadrant. No visible free air or free fluid on the supine radiograph. IMPRESSION: Persistent dilatation of small bowel. The possibility of partial small bowel obstruction should be considered. Electronically Signed   By: Lorriane Shire M.D.   On: 06/16/2016 10:15   Dg Abd 2 Views  Result Date: 06/17/2016 CLINICAL DATA:  83 year old female with 5 days of abdominal pain pain. EXAM: ABDOMEN - 2 VIEW COMPARISON:  06/16/2016 and earlier including CT Abdomen and Pelvis 06/13/2016 FINDINGS: Upright and supine views of the abdomen and pelvis. Moderate to severe gaseous distension of the stomach with the fluid level on the upright view. No pneumoperitoneum. Mild patchy lung base opacity. Decreased small bowel gas since 06/13/2016. Persistent 45 mm diameter left lower quadrant small bowel loop similar to the recent CT. Paucity of large bowel gas. Stable abdominal and pelvic visceral contours. No acute osseous  abnormality identified. IMPRESSION: 1. Progressive gastric distention since 06/13/2016 suspicious for gastric outlet obstruction. 2. Persistent small bowel obstruction suspected although decreased volume of small bowel gas since the CT on 06/13/2016. 3. No free air. 4. Patchy lung base opacity likely is atelectasis. Electronically Signed   By: Genevie Ann M.D.   On: 06/17/2016 10:46   Dg Abd Portable 1v-small Bowel Protocol-position Verification  Result Date: 06/17/2016 CLINICAL DATA:  NG tube placement. EXAM: PORTABLE ABDOMEN - 1 VIEW COMPARISON:  05/30 2018. FINDINGS: NG tube noted with its tip over the distal stomach. No bowel distention. No free air. Basilar atelectasis and/or infiltrates again noted. Vascular calcification. IMPRESSION: 1. NG tube noted with its tip projected over the distal stomach. No gastric or bowel distention. 2.  Basilar atelectasis and/or infiltrates. Electronically  Signed   ByMarcello Moores  Register   On: 06/17/2016 13:47    Scheduled Meds: . enoxaparin (LOVENOX) injection  40 mg Subcutaneous Q24H  . pantoprazole (PROTONIX) IV  40 mg Intravenous Q12H  . sucralfate  1 g Oral TID WC & HS   Continuous Infusions: . sodium chloride 50 mL/hr at 06/17/16 0522  . ciprofloxacin Stopped (06/17/16 1411)  . metronidazole Stopped (06/17/16 1602)     LOS: 3 days    Chipper Oman, MD Pager: Text Page via [email or web address]  [phone]  If 7PM-7AM, please contact night-coverage [email or web address] Password [facility] 06/17/2016, 4:35 PM

## 2016-06-17 NOTE — Progress Notes (Signed)
[facility] Progress Note     Subjective: CC: abdominal distention Patient reports 3 loose BM's yesterday. Denies any further nausea or vomiting. Abdominal distention improving. Denies fever, chills, SOB. Mobilizing. At baseline patient reports taking a stool softener or laxative PRN to maintain regular bowel function.   Objective: Vital signs in last 24 hours: Temp:  [98.3 F (36.8 C)-98.4 F (36.9 C)] 98.4 F (36.9 C) (05/30 0433) Pulse Rate:  [87-94] 87 (05/30 0433) Resp:  [18-19] 18 (05/30 0433) BP: (143-149)/(72-75) 143/75 (05/30 0433) SpO2:  [98 %-99 %] 98 % (05/30 0433) Last BM Date: 06/11/16  Intake/Output from previous day: 05/29 0701 - 05/30 0700 In: 3368.3 [P.O.:1200; I.V.:1368.3; IV Piggyback:800] Out: 1320 [Urine:1320] Intake/Output this shift: No intake/output data recorded.  PE: Gen:  Alert, NAD, pleasant and cooperative  Card:  Regular rate and rhythm, pedal pulses 2+ BL Pulm:  Normal effort, clear to auscultation bilaterally with slightly diminished breath sounds in bilateral bases. Abd: Soft, non-tender, mild distention, bowel sounds present in all 4 quadrants, previous surgical scars noted Skin: warm and dry, no rashes  Psych: A&Ox3   Lab Results:   Recent Labs  06/15/16 0456 06/16/16 0506  WBC 8.8 7.3  HGB 11.6* 12.1  HCT 35.7* 37.3  PLT 198 204   BMET  Recent Labs  06/15/16 0456 06/16/16 0506  NA 132* 129*  K 3.5 3.7  CL 102 99*  CO2 21* 20*  GLUCOSE 127* 142*  BUN 7 12  CREATININE 0.80 1.05*  CALCIUM 8.8* 8.7*   PT/INR No results for input(s): LABPROT, INR in the last 72 hours. CMP     Component Value Date/Time   NA 129 (L) 06/16/2016 0506   K 3.7 06/16/2016 0506   CL 99 (L) 06/16/2016 0506   CO2 20 (L) 06/16/2016 0506   GLUCOSE 142 (H) 06/16/2016 0506   BUN 12 06/16/2016 0506   CREATININE 1.05 (H) 06/16/2016 0506   CALCIUM 8.7 (L) 06/16/2016 0506   PROT 6.5 06/14/2016 0340   ALBUMIN 3.1 (L) 06/14/2016 0340    AST 23 06/14/2016 0340   ALT 19 06/14/2016 0340   ALKPHOS 38 06/14/2016 0340   BILITOT 0.6 06/14/2016 0340   GFRNONAA 47 (L) 06/16/2016 0506   GFRAA 55 (L) 06/16/2016 0506   Lipase     Component Value Date/Time   LIPASE 39 06/12/2016 2223       Studies/Results: Dg Abd 1 View  Result Date: 06/16/2016 CLINICAL DATA:  Abdominal pain and distention. EXAM: ABDOMEN - 1 VIEW COMPARISON:  06/15/2016 and 06/14/2016 and CT scan dated 06/13/2016 FINDINGS: There is persistent dilatation of small bowel loops in the mid abdomen. The colon is not distended. Surgical staples in the right lower quadrant. No visible free air or free fluid on the supine radiograph. IMPRESSION: Persistent dilatation of small bowel. The possibility of partial small bowel obstruction should be considered. Electronically Signed   By: Lorriane Shire M.D.   On: 06/16/2016 10:15   Dg Abd 1 View  Result Date: 06/15/2016 CLINICAL DATA:  Nausea, vomiting, constipation, abdominal distention, prior small bowel obstruction with resection EXAM: ABDOMEN - 1 VIEW COMPARISON:  Abdominal radiograph dated 06/14/2016. CT abdomen/ pelvis dated 06/13/2016. FINDINGS: Mild gaseous distension of the stomach. Mildly prominent loops of small bowel in the right mid/ lower abdomen. Surgical sutures in the right lower quadrant. Degenerative changes the lower lumbar spine. IMPRESSION: Mild gaseous distension of the stomach and small bowel in the right mid/lower abdomen. Differential considerations include  partial small bowel obstruction or adynamic ileus. Electronically Signed   By: Julian Hy M.D.   On: 06/15/2016 13:40   Dg Chest Port 1 View  Result Date: 06/15/2016 CLINICAL DATA:  Hypertension, small-bowel obstruction, GERD, 2 days of generalized abdominal pain with intermittent nausea and vomiting, history hypertension and fatty liver EXAM: PORTABLE CHEST 1 VIEW COMPARISON:  Portable exam 1532 hours compared to 07/05/2012 FINDINGS: Upper  normal size of cardiac silhouette. Atherosclerotic calcification aorta. Mediastinal contours and pulmonary vascularity normal. Elevation of RIGHT diaphragm with RIGHT basilar atelectasis. Remaining lungs clear. No pleural effusion or pneumothorax. Bones demineralized. IMPRESSION: RIGHT basilar atelectasis. Aortic atherosclerosis. Electronically Signed   By: Lavonia Dana M.D.   On: 06/15/2016 15:50    Anti-infectives: Anti-infectives    Start     Dose/Rate Route Frequency Ordered Stop   06/13/16 0500  metroNIDAZOLE (FLAGYL) IVPB 500 mg     500 mg 100 mL/hr over 60 Minutes Intravenous Every 8 hours 06/13/16 0437     06/13/16 0500  ciprofloxacin (CIPRO) IVPB 400 mg     400 mg 200 mL/hr over 60 Minutes Intravenous 2 times daily 06/13/16 0438       Assessment/Plan Ileitis vs SBO - PMH ileocecectomy for perforated viscus >40 years ago - afebrile, VSS, non-tender on exam, bowel function returning   - AXR this AM, will follow  - recommend advance diet as tolerated to SOFT - ambulate and minimize/remove narcotic medications - Miralax PRN at discharge    LOS: 3 days    Jill Alexanders , [facility] 06/17/2016, 7:54 AM Pager: [phone] Consults: [phone] Mon-Fri 7:00 am-4:30 pm Sat-Sun 7:00 am-11:30 am

## 2016-06-17 NOTE — Progress Notes (Signed)
Brief update: Despite clinical improvement, abdominal film this morning with significant dilation of the stomach along with some proximal small bowel dilatation. Discussed patients case with Dr. Benson Norway - he reports EGD in 2016 was normal and has a low suspicion for GOO. I think it is possible that the patient has incomplete resolution of pSBO. At this time I recommend nasogastric decompression of the stomach and small bowel protocol as below:  1. Place NG tube and place on LIWS 2. Confirm placement with AXR 3. After 2 hours of low intermittent suction, CLAMP NGT and administer gastrografin. Do no resume LIWS for at least one hour after administration of contrast. After that point, resume LIWS.   Obie Dredge, PA-C [facility] Pager: [phone] Consults: [phone] Mon-Fri 7:00 am-4:30 pm Sat-Sun 7:00 am-11:30 am

## 2016-06-17 NOTE — Progress Notes (Signed)
Placement confirmed, tube placed to low wall suction for one hour then will changed to intermittent low wall suction. SRP, RN

## 2016-06-17 NOTE — Progress Notes (Signed)
Gastrografin instilled and clamped as order will leave for 2 hrs then return to suction as ordered. SRP, RN

## 2016-06-17 NOTE — Progress Notes (Signed)
Physical Therapy Treatment Patient Details Name: [name] [name] MRN: [id number] DOB: 02-Jan-1929 Today's Date: 06/17/2016    History of Present Illness 83 y.o. female with medical history significant of HTN, SBO, generalized anxiety disorder, GERD; who presented with complaints of generalized abdominal pain and admitted for suspected Ileitis with dilated small bowel loops    PT Comments    Assisted pt out of recliner to amb to bathroom first before walking (multiple days of loose stools)  Assisted with amb around unit twice with walker due to Acute setting otherwise believe pt will not need after D/C.  Pt stated she attends Aquatic Therapy 2x/wk and does a lot of walker for exercise.    Follow Up Recommendations  No PT follow up     Equipment Recommendations  None recommended by PT    Recommendations for Other Services       Precautions / Restrictions Precautions Precautions: None Restrictions Weight Bearing Restrictions: No    Mobility  Bed Mobility               General bed mobility comments: OOB in recliner  Transfers Overall transfer level: Needs assistance Equipment used: Rolling walker (2 wheeled);None Transfers: Sit to/from Stand           General transfer comment: one initial VC to push from recliner vs pull up on walker  Ambulation/Gait   Ambulation Distance (Feet): 500 Feet Assistive device: Rolling walker (2 wheeled) Gait Pattern/deviations: Step-through pattern;Decreased stride length;Trunk flexed     General Gait Details: verbal cues for safe use of RW with turns   Financial trader Rankin (Stroke Patients Only)       Balance                                            Cognition                                              Exercises      General Comments        Pertinent Vitals/Pain Pain Assessment: Faces Faces Pain Scale: Hurts a little  bit Pain Location: ABD    bloated/tight/distended Pain Descriptors / Indicators: Discomfort Pain Intervention(s): Monitored during session    Home Living                      Prior Function            PT Goals (current goals can now be found in the care plan section) Progress towards PT goals: Progressing toward goals    Frequency    Min 3X/week      PT Plan Current plan remains appropriate    Co-evaluation              AM-PAC PT "6 Clicks" Daily Activity  Outcome Measure  Difficulty turning over in bed (including adjusting bedclothes, sheets and blankets)?: None Difficulty moving from lying on back to sitting on the side of the bed? : None Difficulty sitting down on and standing up from a chair with arms (e.g., wheelchair, bedside commode, etc,.)?: A Little Help needed moving to and from a bed to chair (including  a wheelchair)?: A Little Help needed walking in hospital room?: A Little Help needed climbing 3-5 steps with a railing? : A Little 6 Click Score: 20    End of Session Equipment Utilized During Treatment: Gait belt Activity Tolerance: Patient tolerated treatment well Patient left: in chair;with call bell/phone within reach Nurse Communication: Mobility status PT Visit Diagnosis: Difficulty in walking, not elsewhere classified (R26.2)     Time: 5498-2641 PT Time Calculation (min) (ACUTE ONLY): 23 min  Charges:  $Gait Training: 8-22 mins $Therapeutic Activity: 8-22 mins                    G Codes:       Rica Koyanagi  PTA [facility]  Acute  Rehab Pager      [phone]

## 2016-06-18 DIAGNOSIS — I10 Essential (primary) hypertension: Secondary | ICD-10-CM

## 2016-06-18 LAB — CBC
HEMATOCRIT: 29.1 % — AB (ref 36.0–46.0)
HEMOGLOBIN: 9.4 g/dL — AB (ref 12.0–15.0)
MCH: 27.1 pg (ref 26.0–34.0)
MCHC: 32.3 g/dL (ref 30.0–36.0)
MCV: 83.9 fL (ref 78.0–100.0)
Platelets: 171 10*3/uL (ref 150–400)
RBC: 3.47 MIL/uL — ABNORMAL LOW (ref 3.87–5.11)
RDW: 15.1 % (ref 11.5–15.5)
WBC: 6.6 10*3/uL (ref 4.0–10.5)

## 2016-06-18 LAB — BASIC METABOLIC PANEL
ANION GAP: 6 (ref 5–15)
BUN: 8 mg/dL (ref 6–20)
CO2: 23 mmol/L (ref 22–32)
Calcium: 8 mg/dL — ABNORMAL LOW (ref 8.9–10.3)
Chloride: 109 mmol/L (ref 101–111)
Creatinine, Ser: 0.99 mg/dL (ref 0.44–1.00)
GFR calc Af Amer: 59 mL/min — ABNORMAL LOW (ref >60)
GFR, EST NON AFRICAN AMERICAN: 51 mL/min — AB (ref >60)
GLUCOSE: 94 mg/dL (ref 65–99)
POTASSIUM: 3 mmol/L — AB (ref 3.5–5.1)
Sodium: 138 mmol/L (ref 135–145)

## 2016-06-18 LAB — MAGNESIUM: Magnesium: 1.8 mg/dL (ref 1.7–2.4)

## 2016-06-18 LAB — HEMOGLOBIN AND HEMATOCRIT, BLOOD
HEMATOCRIT: 36.8 % (ref 36.0–46.0)
HEMOGLOBIN: 12.2 g/dL (ref 12.0–15.0)

## 2016-06-18 MED ORDER — POTASSIUM CHLORIDE 10 MEQ/100ML IV SOLN
10.0000 meq | INTRAVENOUS | Status: AC
  Administered 2016-06-18 (×4): 10 meq via INTRAVENOUS
  Filled 2016-06-18 (×4): qty 100

## 2016-06-18 NOTE — Progress Notes (Signed)
Physical Therapy Treatment Patient Details Name: [name] [name] MRN: [id number] DOB: Jun 10, 1928 Today's Date: 06/18/2016    History of Present Illness 83 y.o. female with medical history significant of HTN, SBO, generalized anxiety disorder, GERD; who presented with complaints of generalized abdominal pain and admitted for suspected Ileitis with dilated small bowel loops    PT Comments    Pt progressing well with mobility, she ambulated with decreased support, 500' holding IV pole, no loss of balance.   Follow Up Recommendations  No PT follow up     Equipment Recommendations  None recommended by PT    Recommendations for Other Services       Precautions / Restrictions Precautions Precautions: None Restrictions Weight Bearing Restrictions: No    Mobility  Bed Mobility               General bed mobility comments: OOB in recliner  Transfers Overall transfer level: Needs assistance Equipment used: Rolling walker (2 wheeled);None Transfers: Sit to/from Stand           General transfer comment: VCs hand placement, increased time/effort for sit to stand  Ambulation/Gait Ambulation/Gait assistance: Modified independent (Device/Increase time) Ambulation Distance (Feet): 500 Feet Assistive device:  (RUE on IV pole) Gait Pattern/deviations: Step-through pattern;Decreased stride length;Trunk flexed     General Gait Details: steady, no LOB   Stairs            Wheelchair Mobility    Modified Rankin (Stroke Patients Only)       Balance Overall balance assessment: Modified Independent                                          Cognition Arousal/Alertness: Awake/alert Behavior During Therapy: WFL for tasks assessed/performed Overall Cognitive Status: Within Functional Limits for tasks assessed                                        Exercises      General Comments        Pertinent Vitals/Pain Pain Assessment:  No/denies pain    Home Living                      Prior Function            PT Goals (current goals can now be found in the care plan section) Acute Rehab PT Goals PT Goal Formulation: With patient Time For Goal Achievement: 06/23/16 Potential to Achieve Goals: Good Progress towards PT goals: Progressing toward goals    Frequency    Min 3X/week      PT Plan Current plan remains appropriate    Co-evaluation              AM-PAC PT "6 Clicks" Daily Activity  Outcome Measure  Difficulty turning over in bed (including adjusting bedclothes, sheets and blankets)?: None Difficulty moving from lying on back to sitting on the side of the bed? : None Difficulty sitting down on and standing up from a chair with arms (e.g., wheelchair, bedside commode, etc,.)?: A Little Help needed moving to and from a bed to chair (including a wheelchair)?: A Little Help needed walking in hospital room?: A Little Help needed climbing 3-5 steps with a railing? : A Little 6 Click Score: 20  End of Session Equipment Utilized During Treatment: Gait belt Activity Tolerance: Patient tolerated treatment well Patient left: in chair;with call bell/phone within reach Nurse Communication: Mobility status PT Visit Diagnosis: Difficulty in walking, not elsewhere classified (R26.2)     Time: 7530-0511 PT Time Calculation (min) (ACUTE ONLY): 25 min  Charges:  $Gait Training: 8-22 mins $Therapeutic Activity: 8-22 mins                    G Codes:         Philomena Doheny 06/18/2016, 11:31 AM [phone]

## 2016-06-18 NOTE — Progress Notes (Signed)
[facility] Progress Note     Subjective: CC: SBO vs ileitis Patient reports no concerns or complaints this AM. She denies fever/chills or abdominal pain. She is having bowel function. Urinating without hesitancy.   Objective: Vital signs in last 24 hours: Temp:  [97.5 F (36.4 C)-97.7 F (36.5 C)] 97.7 F (36.5 C) (05/31 2979) Pulse Rate:  [70-84] 70 (05/31 0632) Resp:  [17-20] 20 (05/31 8921) BP: (143-157)/(62-67) 153/67 (05/31 1941) SpO2:  [97 %-98 %] 97 % (05/31 7408) Last BM Date: 06/17/16  Intake/Output from previous day: 05/30 0701 - 05/31 0700 In: 655 [I.V.:605; IV Piggyback:50] Out: -  Intake/Output this shift: No intake/output data recorded.  PE: Gen:  Alert, NAD, pleasant and cooperative  HEENT: NG tube in place, ~800 cc in cannister. Card:  Regular rate and rhythm, pedal pulses 2+ BL Pulm:  Normal effort, clear to auscultation bilaterally Abd: Soft, non-tender, mild distention, bowel sounds present, previous surgical scars noted Skin: warm and dry, no rashes  Psych: A&Ox3   Lab Results:   Recent Labs  06/16/16 0506 06/18/16 0555  WBC 7.3 6.6  HGB 12.1 9.4*  HCT 37.3 29.1*  PLT 204 171   BMET  Recent Labs  06/17/16 0804 06/18/16 0555  NA 137 138  K 3.1* 3.0*  CL 109 109  CO2 24 23  GLUCOSE 94 94  BUN 11 8  CREATININE 1.01* 0.99  CALCIUM 8.1* 8.0*   PT/INR No results for input(s): LABPROT, INR in the last 72 hours. CMP     Component Value Date/Time   NA 138 06/18/2016 0555   K 3.0 (L) 06/18/2016 0555   CL 109 06/18/2016 0555   CO2 23 06/18/2016 0555   GLUCOSE 94 06/18/2016 0555   BUN 8 06/18/2016 0555   CREATININE 0.99 06/18/2016 0555   CALCIUM 8.0 (L) 06/18/2016 0555   PROT 6.5 06/14/2016 0340   ALBUMIN 3.1 (L) 06/14/2016 0340   AST 23 06/14/2016 0340   ALT 19 06/14/2016 0340   ALKPHOS 38 06/14/2016 0340   BILITOT 0.6 06/14/2016 0340   GFRNONAA 51 (L) 06/18/2016 0555   GFRAA 59 (L) 06/18/2016 0555   Lipase    Component Value Date/Time   LIPASE 39 06/12/2016 2223       Studies/Results: Dg Abd 1 View  Result Date: 06/16/2016 CLINICAL DATA:  Abdominal pain and distention. EXAM: ABDOMEN - 1 VIEW COMPARISON:  06/15/2016 and 06/14/2016 and CT scan dated 06/13/2016 FINDINGS: There is persistent dilatation of small bowel loops in the mid abdomen. The colon is not distended. Surgical staples in the right lower quadrant. No visible free air or free fluid on the supine radiograph. IMPRESSION: Persistent dilatation of small bowel. The possibility of partial small bowel obstruction should be considered. Electronically Signed   By: Lorriane Shire M.D.   On: 06/16/2016 10:15   Dg Abd 2 Views  Result Date: 06/17/2016 CLINICAL DATA:  83 year old female with 5 days of abdominal pain pain. EXAM: ABDOMEN - 2 VIEW COMPARISON:  06/16/2016 and earlier including CT Abdomen and Pelvis 06/13/2016 FINDINGS: Upright and supine views of the abdomen and pelvis. Moderate to severe gaseous distension of the stomach with the fluid level on the upright view. No pneumoperitoneum. Mild patchy lung base opacity. Decreased small bowel gas since 06/13/2016. Persistent 45 mm diameter left lower quadrant small bowel loop similar to the recent CT. Paucity of large bowel gas. Stable abdominal and pelvic visceral contours. No acute osseous abnormality identified. IMPRESSION: 1. Progressive gastric distention  since 06/13/2016 suspicious for gastric outlet obstruction. 2. Persistent small bowel obstruction suspected although decreased volume of small bowel gas since the CT on 06/13/2016. 3. No free air. 4. Patchy lung base opacity likely is atelectasis. Electronically Signed   By: Genevie Ann M.D.   On: 06/17/2016 10:46   Dg Abd Portable 1v-small Bowel Obstruction Protocol-initial, 8 Hr Delay  Result Date: 06/18/2016 CLINICAL DATA:  Small-bowel obstruction protocol. 8 hour delay image. Initial encounter. EXAM: PORTABLE ABDOMEN - 1 VIEW COMPARISON:   Abdominal radiograph performed earlier today at 1:24 p.m. FINDINGS: Contrast is noted filling the colon, without evidence for bowel obstruction. A distended small bowel loop is again noted at left lower quadrant, measuring up to 5.4 cm in diameter. The patient's enteric tube is noted ending overlying the body of the stomach. No acute osseous abnormalities are seen. IMPRESSION: Contrast noted filling the colon, without evidence for bowel obstruction at this time. Underlying distended small bowel loops likely reflect mild dysmotility. Electronically Signed   By: Garald Balding M.D.   On: 06/18/2016 05:22   Dg Abd Portable 1v-small Bowel Protocol-position Verification  Result Date: 06/17/2016 CLINICAL DATA:  NG tube placement. EXAM: PORTABLE ABDOMEN - 1 VIEW COMPARISON:  05/30 2018. FINDINGS: NG tube noted with its tip over the distal stomach. No bowel distention. No free air. Basilar atelectasis and/or infiltrates again noted. Vascular calcification. IMPRESSION: 1. NG tube noted with its tip projected over the distal stomach. No gastric or bowel distention. 2.  Basilar atelectasis and/or infiltrates. 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 AXR 8 h delay with contrast in the colon suggesting pSBO is resolved.  - recommend clamping NGT  and starting clear liquids around the NG tube. If patient tolerates this, NG tube may be removed today and diet advanced to SOFT.  - ambulate and continue to minimize narcotic medications  HTN GERD  FEN: clear liquids ID: cipro and flagyl 5/26-5/30 VTE: Lovenox    LOS: 4 days    Jill Alexanders , [facility] 06/18/2016, 8:02 AM Pager: [phone] Consults: [phone] Mon-Fri 7:00 am-4:30 pm Sat-Sun 7:00 am-11:30 am

## 2016-06-18 NOTE — Progress Notes (Signed)
Subjective: Feeling well.  No complaints.  Objective: Vital signs in last 24 hours: Temp:  [97.5 F (36.4 C)-97.7 F (36.5 C)] 97.7 F (36.5 C) (05/31 1962) Pulse Rate:  [70-84] 70 (05/31 0632) Resp:  [17-20] 20 (05/31 2297) BP: (143-157)/(62-67) 153/67 (05/31 9892) SpO2:  [97 %-98 %] 97 % (05/31 1194) Last BM Date: 06/17/16  Intake/Output from previous day: 05/30 0701 - 05/31 0700 In: 655 [I.V.:605; IV Piggyback:50] Out: -  Intake/Output this shift: Total I/O In: 600 [P.O.:600] Out: -   General appearance: alert and no distress GI: soft, non-tender; bowel sounds normal; no masses,  no organomegaly  Lab Results:  Recent Labs  06/16/16 0506 06/18/16 0555 06/18/16 1120  WBC 7.3 6.6  --   HGB 12.1 9.4* 12.2  HCT 37.3 29.1* 36.8  PLT 204 171  --    BMET  Recent Labs  06/16/16 0506 06/17/16 0804 06/18/16 0555  NA 129* 137 138  K 3.7 3.1* 3.0*  CL 99* 109 109  CO2 20* 24 23  GLUCOSE 142* 94 94  BUN 12 11 8   CREATININE 1.05* 1.01* 0.99  CALCIUM 8.7* 8.1* 8.0*   LFT No results for input(s): PROT, ALBUMIN, AST, ALT, ALKPHOS, BILITOT, BILIDIR, IBILI in the last 72 hours. PT/INR No results for input(s): LABPROT, INR in the last 72 hours. Hepatitis Panel No results for input(s): HEPBSAG, HCVAB, HEPAIGM, HEPBIGM in the last 72 hours. C-Diff No results for input(s): CDIFFTOX in the last 72 hours. Fecal Lactopherrin No results for input(s): FECLLACTOFRN in the last 72 hours.  Studies/Results: Dg Abd 2 Views  Result Date: 06/17/2016 CLINICAL DATA:  83 year old female with 5 days of abdominal pain pain. EXAM: ABDOMEN - 2 VIEW COMPARISON:  06/16/2016 and earlier including CT Abdomen and Pelvis 06/13/2016 FINDINGS: Upright and supine views of the abdomen and pelvis. Moderate to severe gaseous distension of the stomach with the fluid level on the upright view. No pneumoperitoneum. Mild patchy lung base opacity. Decreased small bowel gas since 06/13/2016. Persistent 45  mm diameter left lower quadrant small bowel loop similar to the recent CT. Paucity of large bowel gas. Stable abdominal and pelvic visceral contours. No acute osseous abnormality identified. IMPRESSION: 1. Progressive gastric distention since 06/13/2016 suspicious for gastric outlet obstruction. 2. Persistent small bowel obstruction suspected although decreased volume of small bowel gas since the CT on 06/13/2016. 3. No free air. 4. Patchy lung base opacity likely is atelectasis. Electronically Signed   By: Genevie Ann M.D.   On: 06/17/2016 10:46   Dg Abd Portable 1v-small Bowel Obstruction Protocol-initial, 8 Hr Delay  Result Date: 06/18/2016 CLINICAL DATA:  Small-bowel obstruction protocol. 8 hour delay image. Initial encounter. EXAM: PORTABLE ABDOMEN - 1 VIEW COMPARISON:  Abdominal radiograph performed earlier today at 1:24 p.m. FINDINGS: Contrast is noted filling the colon, without evidence for bowel obstruction. A distended small bowel loop is again noted at left lower quadrant, measuring up to 5.4 cm in diameter. The patient's enteric tube is noted ending overlying the body of the stomach. No acute osseous abnormalities are seen. IMPRESSION: Contrast noted filling the colon, without evidence for bowel obstruction at this time. Underlying distended small bowel loops likely reflect mild dysmotility. Electronically Signed   By: Garald Balding M.D.   On: 06/18/2016 05:22   Dg Abd Portable 1v-small Bowel Protocol-position Verification  Result Date: 06/17/2016 CLINICAL DATA:  NG tube placement. EXAM: PORTABLE ABDOMEN - 1 VIEW COMPARISON:  05/30 2018. FINDINGS: NG tube noted with its tip over  the distal stomach. No bowel distention. No free air. Basilar atelectasis and/or infiltrates again noted. Vascular calcification. IMPRESSION: 1. NG tube noted with its tip projected over the distal stomach. No gastric or bowel distention. 2.  Basilar atelectasis and/or infiltrates. Electronically Signed   By: Marcello Moores   Register   On: 06/17/2016 13:47    Medications:  Scheduled: . enoxaparin (LOVENOX) injection  40 mg Subcutaneous Q24H  . pantoprazole (PROTONIX) IV  40 mg Intravenous Q12H  . sucralfate  1 g Oral TID WC & HS   Continuous: . dextrose 5 % and 0.45% NaCl 50 mL/hr at 06/18/16 1252  . famotidine (PEPCID) IV Stopped (06/17/16 1917)    Assessment/Plan: 1) SBO - Clinically appears resolved.  Tolerating clears.  KUB yesterday was normal.  Hopefully her diet can be advanced and the NG tube removed.  ? D/C home tomorrow.  LOS: 4 days   Sherese Heyward D 06/18/2016, 1:55 PM

## 2016-06-18 NOTE — Care Management Important Message (Signed)
Important Message  Patient Details  Name: LILLYTH SPONG MRN: [id number] Date of Birth: 08/13/1928   Medicare Important Message Given:       Kerin Salen 06/18/2016, 10:53 AMImportant Message  Patient Details  Name: IDOLINA MANTELL MRN: [id number] Date of Birth: Dec 07, 1928   Medicare Important Message Given:       Kerin Salen 06/18/2016, 10:48 AMImportant Message  Patient Details  Name: CARRIANNE HYUN MRN: [id number] Date of Birth: 28-Apr-1928   Medicare Important Message Given:       Kerin Salen 06/18/2016, 10:48 AM

## 2016-06-18 NOTE — Progress Notes (Signed)
Patients diet advanced to clear liquids. Pt has tolerated liquids. Pt denies n/v and or or pain at this time.

## 2016-06-18 NOTE — Progress Notes (Addendum)
PROGRESS NOTE [facility]   ALANDRA SANDO   KKX:[id number] DOB: Jul 06, 1928  DOA: 06/12/2016 PCP: [name], MD   Brief Narrative:  83 year old AA Female with significant Hx of HTN and SBO presented to the ED on 06/12/16 with acute Abd pain. Small bowel loop of distal ileum wall thickening consistent with ileus found. Started on empiric antibiotics and surgery was consulted. Pt was doing well with conservative Tx, however, x-ray from yesterday showed worsening obstruction + abdominal distention. NG tube was inserted along with small bowel protocol. Patient was improving today. DG Abd portable 1V done today, no obstruction seen. Start on soft foods today and remove NG tube. Admits allergies- start on Claritin.   Subjective: Pt seen and examined, Denies: abdominal pain, N/V. Admits: bowel movements last night w/o blood or black stools.    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   Start on soft diet, remove NG if patient tolerates w/o N/V. Awaiting for resolution without surgical intervention Empiric abx stopped yesterday as there were no sign of infection. Still no signs of infection today.  Essential hypertension - Monitor BP Can restart home BP meds if pt can tolerate po -if pt can't tolerate po then IV hydralazine and metoprolol when necessary   Anxiety - stable  Monitor for now   GERD  Will place on IV Pepcid   Hypokalemia Replace K Check BMP in AM    DVT prophylaxis: lovenox Code Status: Full Family Communication: None at bedside Disposition Plan: Home when medically stable    Consultants:   General Surgery  GI  Procedures:  None  Antimicrobials:  Anti-infectives     Start         Dose/Rate  Route  Frequency  Ordered  Stop    06/13/16 0500   metroNIDAZOLE (FLAGYL) IVPB 500 mg  Status:  Discontinued      500 mg  100 mL/hr over 60 Minutes  Intravenous  Every 8 hours  06/13/16 0437  06/17/16 1648    06/13/16 0500   ciprofloxacin (CIPRO) IVPB 400 mg  Status:  Discontinued      400 mg  200 mL/hr over 60 Minutes  Intravenous  2 times daily  06/13/16 0438  06/17/16 1648    Objective: Vitals:   06/17/16 0433 06/17/16 1447 06/17/16 2146 06/18/16 0632  BP: (!) 143/75 (!) 157/63 (!) 143/62 (!) 153/67  Pulse: 87 84 81 70  Resp: 18 18 17 20   Temp: 98.4 F (36.9 C)  97.5 F (36.4 C) 97.7 F (36.5 C)  TempSrc: Oral Oral Oral Oral  SpO2: 98% 98% 98% 97%  Weight:      Height:        Intake/Output Summary (Last 24 hours) at 06/18/16 1103 Last data filed at 06/18/16 0600  Gross per 24 hour  Intake              655 ml  Output                0 ml  Net              655 ml   Filed Weights   06/12/16 2200 06/13/16 0414  Weight: 170 lb (77.1 kg) 172 lb 3.2 oz (78.1 kg)    Examination:  General exam: Appears stated age, normal affect, in no acute distress Respiratory system: Clear to auscultation. No wheezes,crackle or rhonchi Cardiovascular system: S1 & S2 heard, RRR. No murmurs, rubs or gallops. 2+ distal pulses Gastrointestinal system: Abdomen is mildly distended, soft and nontender. No organomegaly or masses felt. Normal bowel sounds heard. Central nervous system: Alert and oriented x3. No focal neurological deficits. Extremities: No pedal edema.  Skin: No rashes, lesions or ulcers Psychiatry: Judgement and insight appear normal. Mood & affect appropriate.    Data Reviewed: I have personally reviewed following labs and imaging studies  CBC:  Recent Labs Lab 06/12/16 2223 06/13/16 0552 06/14/16 0340 06/15/16 0456 06/16/16 0506 06/18/16 0555  WBC 9.1 8.4 7.2 8.8 7.3 6.6  NEUTROABS 7.6  --  5.7 7.1 6.2  --   HGB 12.3 11.2* 10.6*  11.6* 12.1 9.4*  HCT 37.9 34.9* 33.6* 35.7* 37.3 29.1*  MCV 84.4 83.9 84.4 83.2 82.7 83.9  PLT 231 210 191 198 204 235   Basic Metabolic Panel:  Recent Labs Lab 06/14/16 0340 06/15/16 0456 06/16/16 0506 06/17/16 0804 06/18/16 0555  NA 137 132* 129* 137 138  K 3.8 3.5 3.7 3.1* 3.0*  CL 107 102 99* 109 109  CO2 23 21* 20* 24 23  GLUCOSE 118* 127* 142* 94 94  BUN 7 7 12 11 8   CREATININE 0.87 0.80 1.05* 1.01* 0.99  CALCIUM 8.9 8.8* 8.7* 8.1* 8.0*  MG 1.5* 1.7 1.5*  --  1.8   GFR: Estimated Creatinine Clearance: 42.9 mL/min (by C-G formula based on SCr of 0.99 mg/dL). Liver Function Tests:  Recent Labs Lab 06/12/16 2223 06/14/16 0340  AST 34 23  ALT 28 19  ALKPHOS 52 38  BILITOT 0.4 0.6  PROT 7.8 6.5  ALBUMIN 3.9 3.1*    No results found for this or any previous visit (from the past 240 hour(s)).    Radiology Studies: Dg Abd 2 Views  Result Date: 06/17/2016 CLINICAL DATA:  83 year old female with 5 days of abdominal pain pain. EXAM: ABDOMEN - 2 VIEW COMPARISON:  06/16/2016 and earlier including CT Abdomen and Pelvis 06/13/2016 FINDINGS: Upright and supine views of the abdomen and pelvis. Moderate to severe gaseous distension of the stomach with the fluid level on the upright view. No pneumoperitoneum. Mild patchy lung base opacity. Decreased small bowel gas since 06/13/2016. Persistent 45 mm diameter left lower quadrant small bowel loop similar to the recent CT. Paucity of large bowel gas. Stable abdominal and pelvic visceral contours. No acute osseous abnormality identified. IMPRESSION: 1. Progressive gastric distention since 06/13/2016 suspicious for gastric outlet obstruction. 2. Persistent small bowel obstruction suspected although decreased volume of small bowel gas since the CT on 06/13/2016. 3. No free air. 4. Patchy lung base opacity likely is atelectasis. Electronically Signed   By: Genevie Ann M.D.   On: 06/17/2016 10:46   Dg Abd Portable 1v-small Bowel Obstruction  Protocol-initial, 8 Hr Delay  Result Date: 06/18/2016 CLINICAL DATA:  Small-bowel obstruction protocol. 8 hour delay image. Initial encounter. EXAM: PORTABLE ABDOMEN - 1 VIEW COMPARISON:  Abdominal radiograph performed earlier today at 1:24 p.m. FINDINGS: Contrast is noted filling the colon, without evidence for bowel obstruction. A distended small bowel loop is again noted at left lower quadrant, measuring up to 5.4 cm in diameter. The patient's enteric tube is noted ending overlying the body of the stomach. No acute osseous abnormalities are seen. IMPRESSION: Contrast noted filling the colon, without evidence for bowel obstruction at this time. Underlying  distended small bowel loops likely reflect mild dysmotility. Electronically Signed   By: Garald Balding M.D.   On: 06/18/2016 05:22   Dg Abd Portable 1v-small Bowel Protocol-position Verification  Result Date: 06/17/2016 CLINICAL DATA:  NG tube placement. EXAM: PORTABLE ABDOMEN - 1 VIEW COMPARISON:  05/30 2018. FINDINGS: NG tube noted with its tip over the distal stomach. No bowel distention. No free air. Basilar atelectasis and/or infiltrates again noted. Vascular calcification. IMPRESSION: 1. NG tube noted with its tip projected over the distal stomach. No gastric or bowel distention. 2.  Basilar atelectasis and/or infiltrates. Electronically Signed   By: Marcello Moores  Register   On: 06/17/2016 13:47    Scheduled Meds: . enoxaparin (LOVENOX) injection  40 mg Subcutaneous Q24H  . pantoprazole (PROTONIX) IV  40 mg Intravenous Q12H  . sucralfate  1 g Oral TID WC & HS   Continuous Infusions: . dextrose 5 % and 0.45% NaCl 50 mL/hr at 06/17/16 1754  . famotidine (PEPCID) IV Stopped (06/17/16 1917)  . potassium chloride 10 mEq (06/18/16 0948)     LOS: 4 days    Berna Spare, PA-s   Attending MD note  Patient was seen, examined,treatment plan was discussed with the PA-S Dylan Plocki.  I have personally reviewed the clinical findings, lab, imaging  studies and management of this patient in detail. I agree with the documentation, as recorded by the PA-S and changes to above note were in bold green  Patient is a 83 year old female significant history of hypertension requiring SBO presented to the ED on 5/25 acute abdominal pain patient was admitted for ileus and was treated for SBO. Patient continues to do well NG tube was clamped today and diet advanced.  On Exam: Gen. exam: Awake, alert, not in any distress Chest: Good air entry bilaterally, no rhonchi or rales CVS: S1-S2 regular, no murmurs Abdomen: Soft, nontender and mild distended Neurology: Non-focal Skin: No rash or lesions  Plan Recurrent partial SBO She had abdominal x-ray which showed no obstruction, contrast passed with no complications. NG tube clamped today, diet was advanced and she was tolerating well balanced diet now to soft  Continue to monitor if doing well likely to be discharged in the morning Subjective recommendations appreciated Empiric antibiotics stopped yesterday  Essential hypertension BP is stable Continue to monitor, IV hydralazine while nothing by mouth Since patient is tolerating BP will resume home medications in the morning  Rest as above  Chipper Oman, MD   If 7PM-7AM, please contact night-coverage [email or web address] Password [email or web address] 06/18/2016, 11:03 AM

## 2016-06-18 NOTE — Progress Notes (Signed)
Patient's NGT was dc'd. Pt denies N/V and or pain. Will continue to monitor. Pt has ambulated and tolerated activity well.

## 2016-06-19 ENCOUNTER — Inpatient Hospital Stay (HOSPITAL_COMMUNITY): Payer: Medicare Other

## 2016-06-19 DIAGNOSIS — K567 Ileus, unspecified: Secondary | ICD-10-CM

## 2016-06-19 DIAGNOSIS — E876 Hypokalemia: Secondary | ICD-10-CM

## 2016-06-19 DIAGNOSIS — K56609 Unspecified intestinal obstruction, unspecified as to partial versus complete obstruction: Secondary | ICD-10-CM

## 2016-06-19 LAB — BASIC METABOLIC PANEL
ANION GAP: 6 (ref 5–15)
BUN: <5 mg/dL — ABNORMAL LOW (ref 6–20)
CHLORIDE: 110 mmol/L (ref 101–111)
CO2: 23 mmol/L (ref 22–32)
CREATININE: 0.9 mg/dL (ref 0.44–1.00)
Calcium: 8.2 mg/dL — ABNORMAL LOW (ref 8.9–10.3)
GFR calc non Af Amer: 57 mL/min — ABNORMAL LOW (ref >60)
Glucose, Bld: 105 mg/dL — ABNORMAL HIGH (ref 65–99)
Potassium: 3.2 mmol/L — ABNORMAL LOW (ref 3.5–5.1)
SODIUM: 139 mmol/L (ref 135–145)

## 2016-06-19 MED ORDER — POTASSIUM CHLORIDE ER 10 MEQ PO TBCR: 10.0000 meq | EXTENDED_RELEASE_TABLET | Freq: Two times a day (BID) | ORAL | 0 refills | Status: AC

## 2016-06-19 MED ORDER — POTASSIUM CHLORIDE CRYS ER 20 MEQ PO TBCR
60.0000 meq | EXTENDED_RELEASE_TABLET | Freq: Once | ORAL | Status: AC
  Administered 2016-06-19: 60 meq via ORAL
  Filled 2016-06-19: qty 3

## 2016-06-19 MED ORDER — FAMOTIDINE 20 MG PO TABS: 20.0000 mg | ORAL_TABLET | Freq: Every day | ORAL | 0 refills | Status: AC

## 2016-06-19 NOTE — Progress Notes (Signed)
PROGRESS NOTE [facility]   [name] [name]   HER:[id number] DOB: 02-Jul-1928  DOA: 06/12/2016 PCP: [name], MD   Brief Narrative: 83 year old AA Female with significant Hx of HTN and SBO presented to the ED on 06/12/16 with acute Abd pain. Small bowel loop of distal ileum wall thickening consistent with ileus found. Started on empiric antibiotics and surgery was consulted. Pt was doing well with conservative Tx, however, x-ray from 2 days ago showed worsening obstruction + abdominal distention. NG tube was inserted 2 days along with small bowel protocol, stopped yesterday and diet was progessed. DG Abd portable 1V done today, no obstruction seen, read as normal. Had full diet started yesterday afternoon. Tolerated well.  Subjective: Pt seen and examined, Denies: abdominal pain, N/V. Admits: bowel movements this morning w/o blood or black stools.    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 Started on soft diet and advanced to full. Empiric abx stopped 2 days ago as there were no sign of infection. Still no signs of infection today. Ready for discharge  Essential hypertension - Monitor BP Restart home meds  Anxiety- stable  Monitor for now   GERD  Will place on IV Pepcid   Hypokalemia Replace K    DVT prophylaxis: Lovenox Code Status:Full Family Communication: none at bedside Disposition Plan: Discharge today   Consultants:   General surgery   GI  Procedures:   None  Antimicrobials: Antimicrobials:            Anti-infectives    Start      Dose/Rate  Route  Frequency  Ordered  Stop    06/13/16 0500   metroNIDAZOLE (FLAGYL) IVPB 500 mgStatus:Discontinued      500 mg  100 mL/hr over 60 Minutes  Intravenous  Every 8 hours  06/13/16 0437  06/17/16 1648    06/13/16 0500   ciprofloxacin (CIPRO) IVPB 400 mgStatus:Discontinued     400 mg  200 mL/hr over 60 Minutes  Intravenous  2 times daily  06/13/16 0438  06/17/16 1648       Objective: Vitals:   06/18/16 1300 06/18/16 2100 06/18/16 2156 06/19/16 0429  BP: (!) 160/70  (!) 159/65 (!) 155/64  Pulse: 77  78 77  Resp: 20  18 (!) 22  Temp: 97.2 F (36.2 C) 99 F (37.2 C) 99.1 F (37.3 C) 98.4 F (36.9 C)  TempSrc: Oral  Oral Oral  SpO2: 98%  99% 98%  Weight:      Height:        Intake/Output Summary (Last 24 hours) at 06/19/16 1047 Last data filed at 06/19/16 8563  Gross per 24 hour  Intake             2010 ml  Output                0 ml  Net             2010 ml   Filed Weights   06/12/16 2200 06/13/16 0414  Weight: 170 lb (77.1 kg) 172 lb 3.2 oz (78.1 kg)    Examination:  General exam: Appears stated age and in no acute distress Respiratory system: Clear to auscultation. No wheezes,crackles, or rhonchi Cardiovascular system: S1 & S2 heard, RRR. No  MRG. 2+ distal pulses Gastrointestinal system: Abdomen is mildly distended, soft and nontender. No organomegaly or masses felt. Normal bowel sounds heard. Central nervous system: Alert and oriented x3. No focal neurological deficits. Extremities: No pedal edema Skin: No rashes, lesions or ulcers Psychiatry: Normal Mood & affect appropriate.   Data Reviewed: I have personally reviewed following labs and imaging studies  CBC:  Recent Labs Lab 06/12/16 2223 06/13/16 0552 06/14/16 0340 06/15/16 0456 06/16/16 0506 06/18/16 0555 06/18/16 1120  WBC 9.1 8.4 7.2 8.8 7.3 6.6  --   NEUTROABS 7.6  --  5.7 7.1 6.2  --   --   HGB 12.3 11.2* 10.6* 11.6* 12.1 9.4* 12.2  HCT 37.9 34.9* 33.6* 35.7* 37.3 29.1* 36.8  MCV 84.4 83.9 84.4 83.2 82.7 83.9  --   PLT 231 210 191 198 204 171  --    Basic Metabolic  Panel:  Recent Labs Lab 06/14/16 0340 06/15/16 0456 06/16/16 0506 06/17/16 0804 06/18/16 0555 06/19/16 0527  NA 137 132* 129* 137 138 139  K 3.8 3.5 3.7 3.1* 3.0* 3.2*  CL 107 102 99* 109 109 110  CO2 23 21* 20* 24 23 23   GLUCOSE 118* 127* 142* 94 94 105*  BUN 7 7 12 11 8  <5*  CREATININE 0.87 0.80 1.05* 1.01* 0.99 0.90  CALCIUM 8.9 8.8* 8.7* 8.1* 8.0* 8.2*  MG 1.5* 1.7 1.5*  --  1.8  --    GFR: Estimated Creatinine Clearance: 47.2 mL/min (by C-G formula based on SCr of 0.9 mg/dL). Liver Function Tests:  Recent Labs Lab 06/12/16 2223 06/14/16 0340  AST 34 23  ALT 28 19  ALKPHOS 52 38  BILITOT 0.4 0.6  PROT 7.8 6.5  ALBUMIN 3.9 3.1*    No results found for this or any previous visit (from the past 240 hour(s)).    Radiology Studies: Dg Abd 1 View  Result Date: 06/19/2016 CLINICAL DATA:  Acute lower abdominal pain. EXAM: ABDOMEN - 1 VIEW COMPARISON:  Radiograph of Jun 17, 2016. FINDINGS: The bowel gas pattern is normal. Phleboliths are noted in the pelvis. IMPRESSION: No evidence of bowel obstruction or ileus. Electronically Signed   By: Marijo Conception, M.D.   On: 06/19/2016 10:03   Dg Abd Portable 1v-small Bowel Obstruction Protocol-initial, 8 Hr Delay  Result Date: 06/18/2016 CLINICAL DATA:  Small-bowel obstruction protocol. 8 hour delay image. Initial encounter. EXAM: PORTABLE ABDOMEN - 1 VIEW COMPARISON:  Abdominal radiograph performed earlier today at 1:24 p.m. FINDINGS: Contrast is noted filling the colon, without evidence for bowel obstruction. A distended small bowel loop is again noted at left lower quadrant, measuring up to 5.4 cm in diameter. The patient's enteric tube is noted ending overlying the body of the stomach. No acute osseous abnormalities are seen. IMPRESSION: Contrast noted filling the colon, without evidence for bowel obstruction at this time. Underlying distended small bowel loops likely reflect mild dysmotility. Electronically Signed   By: Garald Balding M.D.   On: 06/18/2016 05:22   Dg Abd Portable 1v-small Bowel Protocol-position Verification  Result Date: 06/17/2016 CLINICAL DATA:  NG tube placement. EXAM: PORTABLE ABDOMEN - 1 VIEW COMPARISON:  05/30 2018. FINDINGS: NG tube noted with its tip over the distal stomach. No bowel distention. No free air. Basilar atelectasis and/or infiltrates again noted. Vascular calcification. IMPRESSION: 1. NG tube noted with its tip projected over the distal stomach. No gastric or bowel distention. 2.  Basilar atelectasis and/or infiltrates. Electronically Signed   By: Marcello Moores  Register   On: 06/17/2016 13:47      Scheduled Meds: . enoxaparin (LOVENOX) injection  40 mg Subcutaneous Q24H  . pantoprazole (PROTONIX) IV  40 mg Intravenous Q12H  . potassium chloride  60 mEq Oral Once  . sucralfate  1 g Oral TID WC & HS   Continuous Infusions: . dextrose 5 % and 0.45% NaCl 50 mL/hr at 06/19/16 0716  . famotidine (PEPCID) IV Stopped (06/18/16 1930)     LOS: 5 days    Lashaunta Sicard, PA-s  If 7PM-7AM, please contact night-coverage [email or web address] Password [facility] 06/19/2016, 10:47 AM

## 2016-06-19 NOTE — Discharge Summary (Addendum)
Physician Discharge Summary  [name] [name]  BVQ:[id number]  DOB: Jul 01, 1928  DOA: 06/12/2016 PCP: [name], MD  Admit date: 06/12/2016 Discharge date: 06/19/2016  Admitted From: Home  Disposition:  Home   Recommendations for Outpatient Follow-up:  1. Follow up with PCP in 1-2 weeks 2. Please obtain BMP/CBC in one week to monitor for Hgb and K 3. Avoid narcotics as this can induce ileus. 4. Encourage ambulation and good hydration   Discharge Condition: Stable  CODE STATUS: FULL  Diet recommendation: Heart Healthy   Brief/Interim Summary: [name] [name] is a 83 year old female with significant history of hypertension and history of SBO presented to the ED with acute abdominal pain. She was found to have a small bowel loop of distal ileum wall thickening consistent with ileus, she was started on empiric antibiotic and surgical team was consulted. Patient has been doing okay with conservative treatment although x-ray from today shows worsening of obstruction with abdominal distention. Patient started on small bowel protocol, NGT was placed with minimal gastric output, patient subsequently improved NGT was clamped and clear liquid were given, she tolerated well. NGT was removed and diet was advanced. Patient tolerating diet well, continue to have regular BM and passing flatus. During hospital stay K trended down due to GI losses, K was repleted. Patient will be discharge home to follow up with PCP.   Subjective: Patient seen and examined on the day of discharge. She continues to do well, have no complaints. Tolerated full breakfast this morning. Passing gas and having regular BM. Patient afebrile.  Discharge Diagnoses/Hospital Course:  Ileus vs recurrent partial SBO - was unable to clinically determinate.  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  Surgical team recommended no surgical interventions at this time.  Avoid narcotics, encourage ambulation and keep good hydration  Follow up with PCP in 1 week   Essential HTN - BP remained fairly stable during hospital stay given patient was w/o oral medication and was treated with PRN only  Now she can resume home medication  Follow up with PCP in 1 week   Hypokalemia due to GI losses and poor nutrition  Repleted via IV  Will discharge with K for next 3 days  Monitor BMP in 1 week   Anxiety - stable  No changes in medications were made.  GERD Patient was started on Pepcid during hospital stay and will continue 20 mg daily  All other chronic medical condition were stable during the hospitalization.  On the day of the discharge the patient's vitals were stable, and no other acute medical condition were reported by patient. Patient was felt safe to be discharge to home   Discharge Instructions  You were cared for by a hospitalist during your hospital stay. If you have any questions about your discharge medications or the care you received while you were in the hospital after you are discharged, you can call the unit and asked to speak with the hospitalist on call if the hospitalist that took care of you is not available. Once you are discharged, your primary care physician will handle any further medical issues. Please note that NO REFILLS for any discharge medications will be authorized once you  are discharged, as it is imperative that you return to your primary care physician (or establish a relationship with a primary care physician if you do not have one) for your aftercare needs so that they can reassess your need for medications and monitor your lab values.  Discharge Instructions    Call MD for:  difficulty breathing, headache or  visual disturbances    Complete by:  As directed    Call MD for:  extreme fatigue    Complete by:  As directed    Call MD for:  hives    Complete by:  As directed    Call MD for:  persistant dizziness or light-headedness    Complete by:  As directed    Call MD for:  persistant nausea and vomiting    Complete by:  As directed    Call MD for:  redness, tenderness, or signs of infection (pain, swelling, redness, odor or green/yellow discharge around incision site)    Complete by:  As directed    Call MD for:  severe uncontrolled pain    Complete by:  As directed    Call MD for:  temperature >100.4    Complete by:  As directed    Diet - low sodium heart healthy    Complete by:  As directed    Increase activity slowly    Complete by:  As directed      Allergies as of 06/19/2016   No Known Allergies     Medication List    STOP taking these medications   traMADol 50 MG tablet Commonly known as:  ULTRAM     TAKE these medications   acetaminophen 500 MG tablet Commonly known as:  TYLENOL Take 1,000 mg by mouth every 6 (six) hours as needed for moderate pain.   ALPRAZolam 0.25 MG tablet Commonly known as:  XANAX Take 1 tablet (0.25 mg total) by mouth 4 (four) times daily as needed. ANXIETY/ SPASM What changed:  reasons to take this  additional instructions   amLODipine 5 MG tablet Commonly known as:  NORVASC Take 5 mg by mouth every morning.   famotidine 20 MG tablet Commonly known as:  PEPCID Take 1 tablet (20 mg total) by mouth daily.   fish oil-omega-3 fatty acids 1000 MG capsule Take 1 g by mouth every morning.   loratadine 10 MG tablet Commonly known as:  CLARITIN Take 10 mg by mouth daily as needed for allergies.   polyethylene glycol packet Commonly known as:  MIRALAX Take 17 g by mouth daily as needed for mild constipation.   potassium chloride 10 MEQ tablet Commonly known as:  K-DUR Take 1 tablet (10 mEq total) by mouth 2 (two) times daily.   Vitamin  D (Ergocalciferol) 50000 units Caps capsule Commonly known as:  DRISDOL Take 50,000 Units by mouth every 7 (seven) days. Friday   VOLTAREN 1 % Gel Generic drug:  diclofenac sodium Apply 2 g topically 3 (three) times daily as needed (pain). To right knee as needed for pain      Follow-up Information    [name], MD. Schedule an appointment as soon as possible for a visit in 1 week(s).   Specialty:  Internal Medicine Why:  Hospital follow up  Contact information: [address] [place] [address] [phone]          No Known Allergies  Consultations:  Gen Surgery - [facility]  GI - [facility]   [facility]    Procedures/Studies: Dg Abd  1 View  Result Date: 06/19/2016 CLINICAL DATA:  Acute lower abdominal pain. EXAM: ABDOMEN - 1 VIEW COMPARISON:  Radiograph of Jun 17, 2016. FINDINGS: The bowel gas pattern is normal. Phleboliths are noted in the pelvis. IMPRESSION: No evidence of bowel obstruction or ileus. Electronically Signed   By: Marijo Conception, M.D.   On: 06/19/2016 10:03   Dg Abd 1 View  Result Date: 06/16/2016 CLINICAL DATA:  Abdominal pain and distention. EXAM: ABDOMEN - 1 VIEW COMPARISON:  06/15/2016 and 06/14/2016 and CT scan dated 06/13/2016 FINDINGS: There is persistent dilatation of small bowel loops in the mid abdomen. The colon is not distended. Surgical staples in the right lower quadrant. No visible free air or free fluid on the supine radiograph. IMPRESSION: Persistent dilatation of small bowel. The possibility of partial small bowel obstruction should be considered. Electronically Signed   By: Lorriane Shire M.D.   On: 06/16/2016 10:15   Dg Abd 1 View  Result Date: 06/15/2016 CLINICAL DATA:  Nausea, vomiting, constipation, abdominal distention, prior small bowel obstruction with resection EXAM: ABDOMEN - 1 VIEW COMPARISON:  Abdominal radiograph dated 06/14/2016. CT abdomen/ pelvis dated 06/13/2016. FINDINGS: Mild gaseous distension of the stomach. Mildly prominent  loops of small bowel in the right mid/ lower abdomen. Surgical sutures in the right lower quadrant. Degenerative changes the lower lumbar spine. IMPRESSION: Mild gaseous distension of the stomach and small bowel in the right mid/lower abdomen. Differential considerations include partial small bowel obstruction or adynamic ileus. Electronically Signed   By: Julian Hy M.D.   On: 06/15/2016 13:40   Dg Abd 1 View  Result Date: 06/13/2016 CLINICAL DATA:  Generalized abdominal pain, nausea, vomiting. EXAM: ABDOMEN - 1 VIEW COMPARISON:  Radiograph of February 26, 2016. FINDINGS: The bowel gas pattern is normal. Residual contrast is noted in the urinary bladder. Postsurgical changes are seen in right lower quadrant. No calculi are noted. IMPRESSION: No evidence of bowel obstruction or ileus. Electronically Signed   By: Marijo Conception, M.D.   On: 06/13/2016 09:19   Ct Abdomen Pelvis W Contrast  Result Date: 06/13/2016 CLINICAL DATA:  Acute onset of generalized abdominal pain and vomiting. Initial encounter. EXAM: CT ABDOMEN AND PELVIS WITH CONTRAST TECHNIQUE: Multidetector CT imaging of the abdomen and pelvis was performed using the standard protocol following bolus administration of intravenous contrast. CONTRAST:  134mL ISOVUE-300 IOPAMIDOL (ISOVUE-300) INJECTION 61% COMPARISON:  CT of the abdomen and pelvis from 02/26/2016 FINDINGS: Lower chest: Right basilar opacity likely reflects atelectasis. Scattered coronary artery calcifications are seen. Hepatobiliary: A 7 mm hypodensity is noted at the hepatic dome. There is mild prominence of the intrahepatic biliary ducts, with dilatation of the common bile duct to 1.2 cm in maximal diameter, raising concern for distal obstruction. The gallbladder is unremarkable in appearance. Pancreas: The pancreas is within normal limits. Spleen: The spleen is unremarkable in appearance. Adrenals/Urinary Tract: The adrenal glands are unremarkable in appearance. The kidneys  are within normal limits. There is no evidence of hydronephrosis. No renal or ureteral stones are identified. No perinephric stranding is seen. Stomach/Bowel: There is dilatation of small-bowel loops up to 4.1 cm in maximal diameter, with gradual fecalization, concerning for dysmotility. Associated wall thickening is noted at the mid to distal ileum. The lack of bowel decompression suggests against bowel obstruction. Postoperative change is noted at the ascending colon. Scattered diverticulosis is noted along the ascending, descending and sigmoid colon, without evidence of diverticulitis. The stomach is grossly unremarkable in appearance. Vascular/Lymphatic: Scattered  calcification is seen along the abdominal aorta and its branches. The abdominal aorta is otherwise grossly unremarkable. The inferior vena cava is grossly unremarkable. No retroperitoneal lymphadenopathy is seen. No pelvic sidewall lymphadenopathy is identified. Reproductive: The bladder is decompressed and not well assessed. The uterus is grossly unremarkable. The ovaries are relatively symmetric. No suspicious adnexal masses are seen. Other: No additional soft tissue abnormalities are seen. Musculoskeletal: No acute osseous abnormalities are identified. Multilevel vacuum phenomenon is noted at the lower lumbar spine, with endplate sclerosis. The visualized musculature is unremarkable in appearance. IMPRESSION: 1. Dilatation of small-bowel loops to 4.1 cm in maximal diameter, with gradual fecalization. Associated wall thickening at the mid to distal ileum, concerning for infectious or inflammatory ileitis, with underlying dysmotility. No evidence of bowel obstruction. 2. Dilatation of the common bile duct to 1.2 cm in maximal diameter, with mild prominence of the intrahepatic biliary ducts, raising concern for distal obstruction. This is grossly stable from 2017 and may reflect the patient's baseline. 3. Right basilar airspace opacity likely reflects  atelectasis. 4. Scattered coronary artery calcifications seen. 5. 7 mm nonspecific hypodensity at the hepatic dome. 6. Scattered diverticulosis along the ascending, descending and sigmoid colon, without evidence of diverticulitis. 7. Scattered aortic atherosclerosis. 8. Mild degenerative change at the lower lumbar spine. Electronically Signed   By: Garald Balding M.D.   On: 06/13/2016 01:01   Dg Chest Port 1 View  Result Date: 06/15/2016 CLINICAL DATA:  Hypertension, small-bowel obstruction, GERD, 2 days of generalized abdominal pain with intermittent nausea and vomiting, history hypertension and fatty liver EXAM: PORTABLE CHEST 1 VIEW COMPARISON:  Portable exam 1532 hours compared to 07/05/2012 FINDINGS: Upper normal size of cardiac silhouette. Atherosclerotic calcification aorta. Mediastinal contours and pulmonary vascularity normal. Elevation of RIGHT diaphragm with RIGHT basilar atelectasis. Remaining lungs clear. No pleural effusion or pneumothorax. Bones demineralized. IMPRESSION: RIGHT basilar atelectasis. Aortic atherosclerosis. Electronically Signed   By: Lavonia Dana M.D.   On: 06/15/2016 15:50   Dg Abd 2 Views  Result Date: 06/17/2016 CLINICAL DATA:  83 year old female with 5 days of abdominal pain pain. EXAM: ABDOMEN - 2 VIEW COMPARISON:  06/16/2016 and earlier including CT Abdomen and Pelvis 06/13/2016 FINDINGS: Upright and supine views of the abdomen and pelvis. Moderate to severe gaseous distension of the stomach with the fluid level on the upright view. No pneumoperitoneum. Mild patchy lung base opacity. Decreased small bowel gas since 06/13/2016. Persistent 45 mm diameter left lower quadrant small bowel loop similar to the recent CT. Paucity of large bowel gas. Stable abdominal and pelvic visceral contours. No acute osseous abnormality identified. IMPRESSION: 1. Progressive gastric distention since 06/13/2016 suspicious for gastric outlet obstruction. 2. Persistent small bowel obstruction  suspected although decreased volume of small bowel gas since the CT on 06/13/2016. 3. No free air. 4. Patchy lung base opacity likely is atelectasis. Electronically Signed   By: Genevie Ann M.D.   On: 06/17/2016 10:46   Dg Abd Portable 1v-small Bowel Obstruction Protocol-initial, 8 Hr Delay  Result Date: 06/18/2016 CLINICAL DATA:  Small-bowel obstruction protocol. 8 hour delay image. Initial encounter. EXAM: PORTABLE ABDOMEN - 1 VIEW COMPARISON:  Abdominal radiograph performed earlier today at 1:24 p.m. FINDINGS: Contrast is noted filling the colon, without evidence for bowel obstruction. A distended small bowel loop is again noted at left lower quadrant, measuring up to 5.4 cm in diameter. The patient's enteric tube is noted ending overlying the body of the stomach. No acute osseous abnormalities are seen. IMPRESSION: Contrast noted  filling the colon, without evidence for bowel obstruction at this time. Underlying distended small bowel loops likely reflect mild dysmotility. Electronically Signed   By: Garald Balding M.D.   On: 06/18/2016 05:22   Dg Abd Portable 1v-small Bowel Protocol-position Verification  Result Date: 06/17/2016 CLINICAL DATA:  NG tube placement. EXAM: PORTABLE ABDOMEN - 1 VIEW COMPARISON:  05/30 2018. FINDINGS: NG tube noted with its tip over the distal stomach. No bowel distention. No free air. Basilar atelectasis and/or infiltrates again noted. Vascular calcification. IMPRESSION: 1. NG tube noted with its tip projected over the distal stomach. No gastric or bowel distention. 2.  Basilar atelectasis and/or infiltrates. Electronically Signed   By: Marcello Moores  Register   On: 06/17/2016 13:47   Dg Abd Portable 1v  Result Date: 06/14/2016 CLINICAL DATA:  Abdominal distention and pain EXAM: PORTABLE ABDOMEN - 1 VIEW COMPARISON:  None. FINDINGS: There is gaseous distention of the stomach. There is no bowel dilatation to suggest obstruction. There is no evidence of pneumoperitoneum, portal venous  gas or pneumatosis. There are no pathologic calcifications along the expected course of the ureters. The osseous structures are unremarkable. IMPRESSION: 1. Isolated gaseous distension of the stomach. Otherwise no evidence of bowel obstruction. Electronically Signed   By: Kathreen Devoid   On: 06/14/2016 10:37    Discharge Exam: Vitals:   06/18/16 2156 06/19/16 0429  BP: (!) 159/65 (!) 155/64  Pulse: 78 77  Resp: 18 (!) 22  Temp: 99.1 F (37.3 C) 98.4 F (36.9 C)   Vitals:   06/18/16 1300 06/18/16 2100 06/18/16 2156 06/19/16 0429  BP: (!) 160/70  (!) 159/65 (!) 155/64  Pulse: 77  78 77  Resp: 20  18 (!) 22  Temp: 97.2 F (36.2 C) 99 F (37.2 C) 99.1 F (37.3 C) 98.4 F (36.9 C)  TempSrc: Oral  Oral Oral  SpO2: 98%  99% 98%  Weight:      Height:        General: Pt is alert, awake, not in acute distress Cardiovascular: RRR, S1/S2 +, no rubs, no gallops Respiratory: CTA bilaterally, no wheezing, no rhonchi Abdominal: Obese, soft, NT ND.  Extremities: no edema, no cyanosis   The results of significant diagnostics from this hospitalization (including imaging, microbiology, ancillary and laboratory) are listed below for reference.     Labs: Basic Metabolic Panel:  Recent Labs Lab 06/14/16 0340 06/15/16 0456 06/16/16 0506 06/17/16 0804 06/18/16 0555 06/19/16 0527  NA 137 132* 129* 137 138 139  K 3.8 3.5 3.7 3.1* 3.0* 3.2*  CL 107 102 99* 109 109 110  CO2 23 21* 20* 24 23 23   GLUCOSE 118* 127* 142* 94 94 105*  BUN 7 7 12 11 8  <5*  CREATININE 0.87 0.80 1.05* 1.01* 0.99 0.90  CALCIUM 8.9 8.8* 8.7* 8.1* 8.0* 8.2*  MG 1.5* 1.7 1.5*  --  1.8  --    Liver Function Tests:  Recent Labs Lab 06/12/16 2223 06/14/16 0340  AST 34 23  ALT 28 19  ALKPHOS 52 38  BILITOT 0.4 0.6  PROT 7.8 6.5  ALBUMIN 3.9 3.1*    Recent Labs Lab 06/12/16 2223  LIPASE 39   CBC:  Recent Labs Lab 06/12/16 2223 06/13/16 0552 06/14/16 0340 06/15/16 0456 06/16/16 0506  06/18/16 0555 06/18/16 1120  WBC 9.1 8.4 7.2 8.8 7.3 6.6  --   NEUTROABS 7.6  --  5.7 7.1 6.2  --   --   HGB 12.3 11.2* 10.6* 11.6* 12.1 9.4*  12.2  HCT 37.9 34.9* 33.6* 35.7* 37.3 29.1* 36.8  MCV 84.4 83.9 84.4 83.2 82.7 83.9  --   PLT 231 210 191 198 204 171  --    Urinalysis    Component Value Date/Time   COLORURINE YELLOW 06/13/2016 0026   APPEARANCEUR CLEAR 06/13/2016 0026   LABSPEC 1.021 06/13/2016 0026   PHURINE 8.0 06/13/2016 0026   GLUCOSEU NEGATIVE 06/13/2016 0026   HGBUR NEGATIVE 06/13/2016 0026   BILIRUBINUR NEGATIVE 06/13/2016 0026   KETONESUR NEGATIVE 06/13/2016 0026   PROTEINUR 100 (A) 06/13/2016 0026   UROBILINOGEN 0.2 11/02/2014 0900   NITRITE NEGATIVE 06/13/2016 0026   LEUKOCYTESUR TRACE (A) 06/13/2016 0026    Time coordinating discharge: 35 minutes  SIGNED:  Chipper Oman, MD  [facility] 06/19/2016, 11:15 AM  Pager please text page via  [email or web address] Password [email or web address]

## 2016-06-19 NOTE — Progress Notes (Signed)
Subjective: No complaints.  Tolerated liquids.  Objective: Vital signs in last 24 hours: Temp:  [97.2 F (36.2 C)-99.1 F (37.3 C)] 98.4 F (36.9 C) (06/01 0429) Pulse Rate:  [77-78] 77 (06/01 0429) Resp:  [18-22] 22 (06/01 0429) BP: (155-160)/(64-70) 155/64 (06/01 0429) SpO2:  [98 %-99 %] 98 % (06/01 0429) Last BM Date: 06/18/16  Intake/Output from previous day: 05/31 0701 - 06/01 0700 In: 1850 [P.O.:600; I.V.:1200; IV Piggyback:50] Out: -  Intake/Output this shift: No intake/output data recorded.  General appearance: alert and no distress GI: some distension and tympany  Lab Results:  Recent Labs  06/18/16 0555 06/18/16 1120  WBC 6.6  --   HGB 9.4* 12.2  HCT 29.1* 36.8  PLT 171  --    BMET  Recent Labs  06/17/16 0804 06/18/16 0555 06/19/16 0527  NA 137 138 139  K 3.1* 3.0* 3.2*  CL 109 109 110  CO2 24 23 23   GLUCOSE 94 94 105*  BUN 11 8 <5*  CREATININE 1.01* 0.99 0.90  CALCIUM 8.1* 8.0* 8.2*   LFT No results for input(s): PROT, ALBUMIN, AST, ALT, ALKPHOS, BILITOT, BILIDIR, IBILI in the last 72 hours. PT/INR No results for input(s): LABPROT, INR in the last 72 hours. Hepatitis Panel No results for input(s): HEPBSAG, HCVAB, HEPAIGM, HEPBIGM in the last 72 hours. C-Diff No results for input(s): CDIFFTOX in the last 72 hours. Fecal Lactopherrin No results for input(s): FECLLACTOFRN in the last 72 hours.  Studies/Results: Dg Abd 2 Views  Result Date: 06/17/2016 CLINICAL DATA:  83 year old female with 5 days of abdominal pain pain. EXAM: ABDOMEN - 2 VIEW COMPARISON:  06/16/2016 and earlier including CT Abdomen and Pelvis 06/13/2016 FINDINGS: Upright and supine views of the abdomen and pelvis. Moderate to severe gaseous distension of the stomach with the fluid level on the upright view. No pneumoperitoneum. Mild patchy lung base opacity. Decreased small bowel gas since 06/13/2016. Persistent 45 mm diameter left lower quadrant small bowel loop similar to  the recent CT. Paucity of large bowel gas. Stable abdominal and pelvic visceral contours. No acute osseous abnormality identified. IMPRESSION: 1. Progressive gastric distention since 06/13/2016 suspicious for gastric outlet obstruction. 2. Persistent small bowel obstruction suspected although decreased volume of small bowel gas since the CT on 06/13/2016. 3. No free air. 4. Patchy lung base opacity likely is atelectasis. Electronically Signed   By: Genevie Ann M.D.   On: 06/17/2016 10:46   Dg Abd Portable 1v-small Bowel Obstruction Protocol-initial, 8 Hr Delay  Result Date: 06/18/2016 CLINICAL DATA:  Small-bowel obstruction protocol. 8 hour delay image. Initial encounter. EXAM: PORTABLE ABDOMEN - 1 VIEW COMPARISON:  Abdominal radiograph performed earlier today at 1:24 p.m. FINDINGS: Contrast is noted filling the colon, without evidence for bowel obstruction. A distended small bowel loop is again noted at left lower quadrant, measuring up to 5.4 cm in diameter. The patient's enteric tube is noted ending overlying the body of the stomach. No acute osseous abnormalities are seen. IMPRESSION: Contrast noted filling the colon, without evidence for bowel obstruction at this time. Underlying distended small bowel loops likely reflect mild dysmotility. Electronically Signed   By: Garald Balding M.D.   On: 06/18/2016 05:22   Dg Abd Portable 1v-small Bowel Protocol-position Verification  Result Date: 06/17/2016 CLINICAL DATA:  NG tube placement. EXAM: PORTABLE ABDOMEN - 1 VIEW COMPARISON:  05/30 2018. FINDINGS: NG tube noted with its tip over the distal stomach. No bowel distention. No free air. Basilar atelectasis and/or infiltrates again noted.  Vascular calcification. IMPRESSION: 1. NG tube noted with its tip projected over the distal stomach. No gastric or bowel distention. 2.  Basilar atelectasis and/or infiltrates. Electronically Signed   By: Marcello Moores  Register   On: 06/17/2016 13:47    Medications:  Scheduled: .  enoxaparin (LOVENOX) injection  40 mg Subcutaneous Q24H  . pantoprazole (PROTONIX) IV  40 mg Intravenous Q12H  . sucralfate  1 g Oral TID WC & HS   Continuous: . dextrose 5 % and 0.45% NaCl 50 mL/hr at 06/19/16 0716  . famotidine (PEPCID) IV Stopped (06/18/16 1930)    Assessment/Plan: 1) SBO - ? Resolved. 2) ABM pain.   Compared with my examination yesterday her abdomen appears to be slightly distended.  There is also evidence of tympany.  I will obtain a repeat KUB.  Clinically she feels well and tolerated clear liquids, however, she does have some mild wincing with palpation of the lower abdomen.  Plan: 1) KUB.  LOS: 5 days   Priyal Musquiz D 06/19/2016, 7:43 AM

## 2016-06-19 NOTE — Progress Notes (Signed)
[facility] Progress Note     Subjective: CC: pSBO v ileitis  Denies abdominal pain. Tolerating diet. Having BMs. Denies nausea or vomiting.   Objective: Vital signs in last 24 hours: Temp:  [97.2 F (36.2 C)-99.1 F (37.3 C)] 98.4 F (36.9 C) (06/01 0429) Pulse Rate:  [77-78] 77 (06/01 0429) Resp:  [18-22] 22 (06/01 0429) BP: (155-160)/(64-70) 155/64 (06/01 0429) SpO2:  [98 %-99 %] 98 % (06/01 0429) Last BM Date: 06/18/16  Intake/Output from previous day: 05/31 0701 - 06/01 0700 In: 1850 [P.O.:600; I.V.:1200; IV Piggyback:50] Out: -  Intake/Output this shift: Total I/O In: 360 [P.O.:360] Out: -   PE: Gen:  Alert, NAD, pleasant Pulm:  Normal effort Abd: Soft, non-tender, non-distended Skin: warm and dry, no rashes  Psych: A&Ox3   Lab Results:   Recent Labs  06/18/16 0555 06/18/16 1120  WBC 6.6  --   HGB 9.4* 12.2  HCT 29.1* 36.8  PLT 171  --    BMET  Recent Labs  06/18/16 0555 06/19/16 0527  NA 138 139  K 3.0* 3.2*  CL 109 110  CO2 23 23  GLUCOSE 94 105*  BUN 8 <5*  CREATININE 0.99 0.90  CALCIUM 8.0* 8.2*   PT/INR No results for input(s): LABPROT, INR in the last 72 hours. CMP     Component Value Date/Time   NA 139 06/19/2016 0527   K 3.2 (L) 06/19/2016 0527   CL 110 06/19/2016 0527   CO2 23 06/19/2016 0527   GLUCOSE 105 (H) 06/19/2016 0527   BUN <5 (L) 06/19/2016 0527   CREATININE 0.90 06/19/2016 0527   CALCIUM 8.2 (L) 06/19/2016 0527   PROT 6.5 06/14/2016 0340   ALBUMIN 3.1 (L) 06/14/2016 0340   AST 23 06/14/2016 0340   ALT 19 06/14/2016 0340   ALKPHOS 38 06/14/2016 0340   BILITOT 0.6 06/14/2016 0340   GFRNONAA 57 (L) 06/19/2016 0527   GFRAA >60 06/19/2016 0527   Lipase     Component Value Date/Time   LIPASE 39 06/12/2016 2223       Studies/Results: Dg Abd 2 Views  Result Date: 06/17/2016 CLINICAL DATA:  83 year old female with 5 days of abdominal pain pain. EXAM: ABDOMEN - 2 VIEW COMPARISON:  06/16/2016  and earlier including CT Abdomen and Pelvis 06/13/2016 FINDINGS: Upright and supine views of the abdomen and pelvis. Moderate to severe gaseous distension of the stomach with the fluid level on the upright view. No pneumoperitoneum. Mild patchy lung base opacity. Decreased small bowel gas since 06/13/2016. Persistent 45 mm diameter left lower quadrant small bowel loop similar to the recent CT. Paucity of large bowel gas. Stable abdominal and pelvic visceral contours. No acute osseous abnormality identified. IMPRESSION: 1. Progressive gastric distention since 06/13/2016 suspicious for gastric outlet obstruction. 2. Persistent small bowel obstruction suspected although decreased volume of small bowel gas since the CT on 06/13/2016. 3. No free air. 4. Patchy lung base opacity likely is atelectasis. Electronically Signed   By: Genevie Ann M.D.   On: 06/17/2016 10:46   Dg Abd Portable 1v-small Bowel Obstruction Protocol-initial, 8 Hr Delay  Result Date: 06/18/2016 CLINICAL DATA:  Small-bowel obstruction protocol. 8 hour delay image. Initial encounter. EXAM: PORTABLE ABDOMEN - 1 VIEW COMPARISON:  Abdominal radiograph performed earlier today at 1:24 p.m. FINDINGS: Contrast is noted filling the colon, without evidence for bowel obstruction. A distended small bowel loop is again noted at left lower quadrant, measuring up to 5.4 cm in diameter. The patient's enteric tube  is noted ending overlying the body of the stomach. No acute osseous abnormalities are seen. IMPRESSION: Contrast noted filling the colon, without evidence for bowel obstruction at this time. Underlying distended small bowel loops likely reflect mild dysmotility. Electronically Signed   By: Garald Balding M.D.   On: 06/18/2016 05:22   Dg Abd Portable 1v-small Bowel Protocol-position Verification  Result Date: 06/17/2016 CLINICAL DATA:  NG tube placement. EXAM: PORTABLE ABDOMEN - 1 VIEW COMPARISON:  05/30 2018. FINDINGS: NG tube noted with its tip over the  distal stomach. No bowel distention. No free air. Basilar atelectasis and/or infiltrates again noted. Vascular calcification. IMPRESSION: 1. NG tube noted with its tip projected over the distal stomach. No gastric or bowel distention. 2.  Basilar atelectasis and/or infiltrates. Electronically Signed   By: Marcello Moores  Register   On: 06/17/2016 13:47    Anti-infectives: Anti-infectives    Start     Dose/Rate Route Frequency Ordered Stop   06/13/16 0500  metroNIDAZOLE (FLAGYL) IVPB 500 mg  Status:  Discontinued     500 mg 100 mL/hr over 60 Minutes Intravenous Every 8 hours 06/13/16 0437 06/17/16 1648   06/13/16 0500  ciprofloxacin (CIPRO) IVPB 400 mg  Status:  Discontinued     400 mg 200 mL/hr over 60 Minutes Intravenous 2 times daily 06/13/16 0438 06/17/16 1648       Assessment/Plan pSBO vs ileitis  - PMH ileocecectomy for perforated viscus >40 years ago - afebrile, VSS, non-tender on exam, having bowel movements  - SB protocol initiated 5/30 after AXR revealed significant gastric distention. AXR 8 h delay with contrast in the colon suggesting pSBO is resolved.   - diet advanced to soft  -  Obstructive symptoms resolved. No acute surgical needs. Stable for discharge from surgical perspective. General surgery will sign off. Call as needed.    HTN GERD  FEN: SOFT diet  ID: cipro and flagyl 5/26-5/30 VTE: Lovenox    LOS: 5 days    Jill Alexanders , [facility] Surgery 06/19/2016, 9:27 AM Pager: [phone] Consults: [phone] Mon-Fri 7:00 am-4:30 pm Sat-Sun 7:00 am-11:30 am

## 2017-07-10 ENCOUNTER — Emergency Department (HOSPITAL_COMMUNITY): Payer: Medicare Other

## 2017-07-10 ENCOUNTER — Emergency Department (HOSPITAL_COMMUNITY)
Admission: EM | Admit: 2017-07-10 | Discharge: 2017-07-10 | Disposition: A | Discharge status: Home / Self Care | Payer: Medicare Other | Attending: Emergency Medicine | Admitting: Emergency Medicine

## 2017-07-10 ENCOUNTER — Other Ambulatory Visit: Payer: Self-pay

## 2017-07-10 ENCOUNTER — Encounter (HOSPITAL_COMMUNITY): Payer: Self-pay

## 2017-07-10 DIAGNOSIS — S161XXA Strain of muscle, fascia and tendon at neck level, initial encounter: Secondary | ICD-10-CM | POA: Diagnosis not present

## 2017-07-10 DIAGNOSIS — Y999 Unspecified external cause status: Secondary | ICD-10-CM | POA: Diagnosis not present

## 2017-07-10 DIAGNOSIS — S0083XA Contusion of other part of head, initial encounter: Secondary | ICD-10-CM

## 2017-07-10 DIAGNOSIS — S0990XA Unspecified injury of head, initial encounter: Secondary | ICD-10-CM | POA: Diagnosis not present

## 2017-07-10 DIAGNOSIS — Z96653 Presence of artificial knee joint, bilateral: Secondary | ICD-10-CM | POA: Diagnosis not present

## 2017-07-10 DIAGNOSIS — Y9389 Activity, other specified: Secondary | ICD-10-CM | POA: Diagnosis not present

## 2017-07-10 DIAGNOSIS — Z79899 Other long term (current) drug therapy: Secondary | ICD-10-CM | POA: Insufficient documentation

## 2017-07-10 DIAGNOSIS — W19XXXA Unspecified fall, initial encounter: Secondary | ICD-10-CM

## 2017-07-10 DIAGNOSIS — Y92013 Bedroom of single-family (private) house as the place of occurrence of the external cause: Secondary | ICD-10-CM | POA: Insufficient documentation

## 2017-07-10 DIAGNOSIS — T07XXXA Unspecified multiple injuries, initial encounter: Secondary | ICD-10-CM | POA: Diagnosis present

## 2017-07-10 DIAGNOSIS — W01190A Fall on same level from slipping, tripping and stumbling with subsequent striking against furniture, initial encounter: Secondary | ICD-10-CM | POA: Insufficient documentation

## 2017-07-10 MED ORDER — ACETAMINOPHEN 500 MG PO TABS
1000.0000 mg | ORAL_TABLET | Freq: Once | ORAL | Status: AC
  Administered 2017-07-10: 1000 mg via ORAL
  Filled 2017-07-10: qty 2

## 2017-07-10 NOTE — ED Notes (Signed)
Patient transported to CT 

## 2017-07-10 NOTE — ED Provider Notes (Signed)
[facility] DEPT Provider Note   CSN: [id number] Arrival date & time: 07/10/17  0825     History   Chief Complaint Chief Complaint  Patient presents with  . Fall  . Shoulder Pain    HPI [name] [name] is a 84 y.o. female.  HPI Patient got up at approximately 5:30 in the morning as per usual.  She went to the bathroom and took some morning medications.  She reports she took some pain medication because she has bilateral knee pain that is fairly chronic.  She reports taking tramadol.  (She has been taking CBD oil nightly for about 3 weeks based on a recommendation of a friend).  Her plan was to rest for a while and then when she felt that her medications were working get up and start her day as per usual.  About 730 she got out of bed.  She reports she thinks she lost her balance or her knees gave out.  She fell striking her head on the nightstand table.  Loss of consciousness.  She reports she does have pain and swelling in the face but it is not severe.  She reports that she has pain across the tops of both of her shoulders and the back of her neck.  She however was able to ultimately get back up.  She reports that she was able to unlock the dorsum of her daughter could come over and check on her.  She has been ambulatory.  Patient reports she does have intermittent falls typically because with her knee replacements she still has problems with pain and instability.  This is not atypical. Past Medical History:  Diagnosis Date  . Allergy    SEASONAL  . Anemia   . Anxiety   . Arthritis    KNEES/HANDS  . 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 Blind loop syndrome   . Complication of anesthesia    pt can't remember exact complication, but she remembers about 30 years ago she may have had trouble waking up from anesthesia.  . Fatty liver   . GERD (gastroesophageal reflux disease)   . Hiatal hernia   . Hypertension   . MVP (mitral valve  prolapse)   . PVC's (premature ventricular contractions)   . Schatzki's ring   . Small bowel obstruction ([place])   . Vitamin B12 deficiency     Patient Active Problem List   Diagnosis Date Noted  . Ileus ([place]) 06/14/2016  . Ileitis 06/13/2016  . Nausea and vomiting 06/13/2016  . Generalized abdominal pain 06/13/2016  . Generalized anxiety disorder 02/26/2016  . Aspiration pneumonia of right lower lobe ([place])   . Small bowel obstruction ([place])   . RLL pneumonia ([place]) 03/26/2015  . SBO (small bowel obstruction) ([place]) 03/24/2015  . S/P left TKA 11/13/2014  . S/P knee replacement 11/13/2014  . Overweight (BMI 25.0-29.9) 07/15/2012  . Expected blood loss anemia 07/15/2012  . S/P right TK revision 07/14/2012  . Ankylosis of knee joint 04/03/2011  . Fibrosis of knee joint 01/22/2011  . Chest pain 10/20/2010  . Bacterial overgrowth syndrome 10/09/2010  . Status post right hemicolectomy 10/09/2010  . Esophageal dysphagia 10/09/2010  . B12 deficiency 10/09/2010  . DYSPHAGIA UNSPECIFIED 01/23/2010  . BLIND LOOP SYNDROME 08/26/2009  . GASTRITIS 12/26/2008  . WEIGHT LOSS-ABNORMAL 12/25/2008  . DYSPHAGIA 12/25/2008  . DIARRHEA 07/17/2008  . HOARSENESS 07/26/2007  . B12 DEFICIENCY 07/25/2007  . Essential hypertension 07/25/2007  . MITRAL VALVE  PROLAPSE 07/25/2007  . PREMATURE VENTRICULAR CONTRACTIONS 07/25/2007  . ESOPHAGITIS, REFLUX 07/25/2007  . Gastroesophageal reflux disease 07/25/2007  . GASTRITIS, CHRONIC 07/25/2007  . HIATAL HERNIA 07/25/2007  . ABSCESS OF INTESTINE 07/25/2007    Past Surgical History:  Procedure Laterality Date  . CATARACT EXTRACTION, BILATERAL  03-26-11  . HEMICOLECTOMY     and ileectomy Dr Vida Rigger; due to perforated viscus  . I&D KNEE WITH POLY EXCHANGE  04/03/2011   Procedure: IRRIGATION AND DEBRIDEMENT KNEE WITH POLY EXCHANGE;  Surgeon: Mcarthur Rossetti, MD;  Location: [facility];  Service: Orthopedics;  Laterality: Right;  . KNEE CLOSED REDUCTION   01/22/2011   Procedure: CLOSED MANIPULATION KNEE;  Surgeon: Mcarthur Rossetti;  Location: [facility];  Service: Orthopedics;  Laterality: Right;  Manipulation under anesthesia right knee  . Nuclear stress test  Sept 2012   Normal  . REPLACEMENT TOTAL KNEE     right  . ROTATOR CUFF REPAIR  03-26-11   right arm  . SYNOVECTOMY  04/03/2011   Procedure: SYNOVECTOMY;  Surgeon: Mcarthur Rossetti, MD;  Location: [facility];  Service: Orthopedics;  Laterality: Right;  . TOTAL KNEE ARTHROPLASTY Left 11/13/2014   Procedure: TOTAL KNEE ARTHROPLASTY;  Surgeon: Paralee Cancel, MD;  Location: [facility];  Service: Orthopedics;  Laterality: Left;  . TOTAL KNEE REVISION Right 07/14/2012   Procedure: RIGHT TOTAL KNEE REVISION;  Surgeon: Mauri Pole, MD;  Location: [facility];  Service: Orthopedics;  Laterality: Right;  . TUBAL LIGATION       OB History   None      Home Medications    Prior to Admission medications   Medication Sig Start Date End Date Taking? Authorizing Provider  acetaminophen (TYLENOL) 500 MG tablet Take 1,000 mg by mouth every 6 (six) hours as needed for moderate pain.   Yes [provider]  alprazolam Duanne Moron) 2 MG tablet TAKE 2 TABLETS AT BEDTIME AS NEEDED FOR SLEEP 07/05/17  Yes [provider]  amLODipine (NORVASC) 5 MG tablet Take 5 mg by mouth every morning.  03/21/15  Yes [provider]  calcium carbonate (TUMS - DOSED IN MG ELEMENTAL CALCIUM) 500 MG chewable tablet Chew 1 tablet by mouth daily as needed for indigestion or heartburn.   Yes [provider]  DEXILANT 60 MG capsule Take 60 mg by mouth daily.  07/03/17  Yes [provider]  fish oil-omega-3 fatty acids 1000 MG capsule Take 1 g by mouth every morning.    Yes [provider]  OVER THE COUNTER MEDICATION Take 10 drops by mouth at bedtime as needed (pain). CBD Oil   Yes [provider]  traMADol (ULTRAM) 50 MG tablet Take 50 mg by mouth every 6 (six) hours as needed for  moderate pain or severe pain.  07/08/17  Yes [provider]  Turmeric (CURCUMIN 95 PO) Take 1 capsule by mouth daily.   Yes [provider]  Vitamin D, Ergocalciferol, (DRISDOL) 50000 UNITS CAPS Take 50,000 Units by mouth every 7 (seven) days. Friday   Yes [provider]  ALPRAZolam (XANAX) 0.25 MG tablet Take 1 tablet (0.25 mg total) by mouth 4 (four) times daily as needed. ANXIETY/ SPASM Patient not taking: Reported on 07/10/2017 07/15/12   Danae Orleans, PA-C  famotidine (PEPCID) 20 MG tablet Take 1 tablet (20 mg total) by mouth daily. 06/19/16 06/19/17  Doreatha Lew, MD  loratadine (CLARITIN) 10 MG tablet Take 10 mg by mouth daily as needed for allergies.  [provider]  polyethylene glycol (MIRALAX) packet Take 17 g by mouth daily as needed for mild constipation. Patient not taking: Reported on 07/10/2017 02/28/16   Barton Dubois, MD  potassium chloride (K-DUR) 10 MEQ tablet Take 1 tablet (10 mEq total) by mouth 2 (two) times daily. 06/19/16 06/22/16  Doreatha Lew, MD  triamcinolone cream (KENALOG) 0.1 % APPLY TO AFFECTED AREA 3 TIMES A DAY AS NEEDED FOR RASH 04/23/17   [provider]  VOLTAREN 1 % GEL Apply 2 g topically 3 (three) times daily as needed (pain). To right knee as needed for pain 12/26/14   [provider]    Family History Family History  Problem Relation Age of Onset  . Hypertension Sister   . Diabetes Sister   . Diabetes Brother   . Coronary artery disease Brother   . Deep vein thrombosis Sister   . Colon cancer Neg Hx   . Colon polyps Neg Hx     Social History Social History   Tobacco Use  . Smoking status: Never Smoker  . Smokeless tobacco: Never Used  Substance Use Topics  . Alcohol use: No  . Drug use: No     Allergies   Patient has no known allergies.   Review of Systems Review of Systems 10 Systems reviewed and are negative for acute change except as noted in the HPI.   Physical  Exam Updated Vital Signs BP (!) 143/79 (BP Location: Left Arm)   Pulse 70   Temp 98 F (36.7 C) (Oral)   Resp 18   Ht 5\' 5"  (1.651 m)   Wt 81.6 kg (180 lb)   SpO2 100%   BMI 29.95 kg/m   Physical Exam  Constitutional: She is oriented to person, place, and time. She appears well-developed and well-nourished.  HENT:  Nose: Nose normal.  Mouth/Throat: Oropharynx is clear and moist.  Patient has mild to moderate swelling over the zygoma on the right and the top of the nasal bridge and glabella area on the right.  No generalized periorbital hematoma.  No lacerations.  Patient is mildly tender to palpation in these areas but no crepitus or step-off extraocular motions normal.  Eyes: Pupils are equal, round, and reactive to light. EOM are normal.  Neck: Neck supple.  Patient does not have bony point tenderness but does endorse discomfort along both of the proximal trapezius and C5-C6 area.  Cardiovascular: Normal rate, regular rhythm, normal heart sounds and intact distal pulses.  Pulmonary/Chest: Effort normal and breath sounds normal. She exhibits no tenderness.  Abdominal: Soft. Bowel sounds are normal. She exhibits no distension. There is no tenderness.  Musculoskeletal: Normal range of motion. She exhibits no edema, tenderness or deformity.  Patient has old surgical scar over the right shoulder.  She reports baseline range of motion limited to about 90 degrees.  She has normal baseline range of motion.  Neurovascularly intact.  No contusions or abrasions to the lower extremities or upper extremities.  Patient can flex deeply at each hip and push forcefully against my weight to extend.  No bony point tenderness over the vertebral bodies of the spine.  Neurological: She is alert and oriented to person, place, and time. She has normal strength. Coordination normal. GCS eye subscore is 4. GCS verbal subscore is 5. GCS motor subscore is 6.  Skin: Skin is warm, dry and intact.  Psychiatric: She  has a normal mood and affect.     ED Treatments / Results  Labs (all labs ordered are listed, but only abnormal results are displayed) Labs Reviewed - No data to display  EKG None  Radiology Dg Chest 2 View  Result Date: 07/10/2017 CLINICAL DATA:  [place] today landing on the left side. EXAM: CHEST - 2 VIEW COMPARISON:  06/15/2016 FINDINGS: Cardiac silhouette is normal in size and configuration. No mediastinal or hilar masses. No evidence of adenopathy. Mild scarring noted at the right lung base, stable. Lungs are otherwise clear. No pleural effusion or pneumothorax. Skeletal structures are intact.  No convincing fracture. IMPRESSION: No active cardiopulmonary disease. Electronically Signed   By: Lajean Manes M.D.   On: 07/10/2017 09:30   Ct Head Wo Contrast  Result Date: 07/10/2017 CLINICAL DATA:  Fall while getting out of bed. EXAM: CT HEAD WITHOUT CONTRAST CT CERVICAL SPINE WITHOUT CONTRAST TECHNIQUE: Multidetector CT imaging of the head and cervical spine was performed following the standard protocol without intravenous contrast. Multiplanar CT image reconstructions of the cervical spine were also generated. COMPARISON:  None. FINDINGS: CT HEAD FINDINGS Brain: Ventricles, cisterns and other CSF spaces are normal. There is no mass, mass effect, shift of midline structures or acute hemorrhage. Mild chronic ischemic microvascular disease. No evidence of acute infarction. Vascular: No hyperdense vessel or unexpected calcification. Skull: Normal. Negative for fracture or focal lesion. Sinuses/Orbits: No acute finding. Other: None. CT CERVICAL SPINE FINDINGS Alignment: Mild reversal of the normal cervical lordosis. Subtle 1-2 mm anterior subluxation of C7 on T1 likely degenerative. Skull base and vertebrae: Moderate spondylosis throughout the cervical spine. Vertebral body heights are maintained. Atlantoaxial articulation is within normal. There is uncovertebral joint spurring and minimal facet  arthropathy. No acute fracture or subluxation. Bilateral neural foraminal narrowing at multiple levels due to adjacent bony spurring. Soft tissues and spinal canal: No prevertebral fluid or swelling. No visible canal hematoma. Disc levels: Moderate disc space narrowing from the C3-4 level to the C6-7 level. Upper chest: Negative. Other: Calcified plaque over the carotid bifurcations bilaterally. IMPRESSION: No acute brain injury. Minimal chronic ischemic microvascular disease. No acute cervical spine injury. Moderate spondylosis of the cervical spine with moderate multilevel disc disease and bilateral multilevel neural foraminal narrowing. Electronically Signed   By: Marin Olp M.D.   On: 07/10/2017 09:42   Ct Cervical Spine Wo Contrast  Result Date: 07/10/2017 CLINICAL DATA:  Fall while getting out of bed. EXAM: CT HEAD WITHOUT CONTRAST CT CERVICAL SPINE WITHOUT CONTRAST TECHNIQUE: Multidetector CT imaging of the head and cervical spine was performed following the standard protocol without intravenous contrast. Multiplanar CT image reconstructions of the cervical spine were also generated. COMPARISON:  None. FINDINGS: CT HEAD FINDINGS Brain: Ventricles, cisterns and other CSF spaces are normal. There is no mass, mass effect, shift of midline structures or acute hemorrhage. Mild chronic ischemic microvascular disease. No evidence of acute infarction. Vascular: No hyperdense vessel or unexpected calcification. Skull: Normal. Negative for fracture or focal lesion. Sinuses/Orbits: No acute finding. Other: None. CT CERVICAL SPINE FINDINGS Alignment: Mild reversal of the normal cervical lordosis. Subtle 1-2 mm anterior subluxation of C7 on T1 likely degenerative. Skull base and vertebrae: Moderate spondylosis throughout the cervical spine. Vertebral body heights are maintained. Atlantoaxial articulation is within normal. There is uncovertebral joint spurring and minimal facet arthropathy. No acute fracture or  subluxation. Bilateral neural foraminal narrowing at multiple levels due to adjacent bony spurring. Soft tissues and spinal canal: No prevertebral fluid or swelling. No visible canal hematoma. Disc levels: Moderate disc space narrowing from the C3-4  level to the C6-7 level. Upper chest: Negative. Other: Calcified plaque over the carotid bifurcations bilaterally. IMPRESSION: No acute brain injury. Minimal chronic ischemic microvascular disease. No acute cervical spine injury. Moderate spondylosis of the cervical spine with moderate multilevel disc disease and bilateral multilevel neural foraminal narrowing. Electronically Signed   By: Marin Olp M.D.   On: 07/10/2017 09:42    Procedures Procedures (including critical care time)  Medications Ordered in ED Medications  acetaminophen (TYLENOL) tablet 1,000 mg (1,000 mg Oral Given 07/10/17 0900)     Initial Impression / Assessment and Plan / ED Course  I have reviewed the triage vital signs and the nursing notes.  Pertinent labs & imaging results that were available during my care of the patient were reviewed by me and considered in my medical decision making (see chart for details).     Final Clinical Impressions(s) / ED Diagnoses   Final diagnoses:  Fall, initial encounter  Injury of head, initial encounter  Contusion of face, initial encounter  Cervical strain, acute, initial encounter  Patient is clinically well in appearance.  She is alert with clear mental status.  No loss of consciousness.  CT head shows no intracranial injury.  Cervical spine no fracture.  Patient is neurovascularly intact.  Patient's daughter is present at bedside.  Return precautions are reviewed.  Patient may have experienced instability due to pain medications taken a couple of hours earlier.  She however has had chronic problems with falls and instability due to weak and painful knees.  She does not show signs of mental confusion or somnolence suggestive of  oversedation due to medications.  ED Discharge Orders    None       Charlesetta Shanks, MD 07/10/17 [phone]

## 2017-07-10 NOTE — ED Triage Notes (Signed)
Patient states she was trying to get OOB this AM and fell. Patient c/o left elbow, and right shoulder pain. Patient has a small abrasion to the bridge of her nose. Patient denies blood thinners.

## 2017-12-17 ENCOUNTER — Emergency Department (HOSPITAL_COMMUNITY): Payer: Medicare Other

## 2017-12-17 ENCOUNTER — Other Ambulatory Visit: Payer: Self-pay

## 2017-12-17 ENCOUNTER — Inpatient Hospital Stay (HOSPITAL_COMMUNITY)
Admission: EM | Admit: 2017-12-17 | Discharge: 2018-01-19 | DRG: 871 | Disposition: E | Payer: Medicare Other | Attending: Family Medicine | Admitting: Family Medicine

## 2017-12-17 ENCOUNTER — Encounter (HOSPITAL_COMMUNITY): Payer: Self-pay | Admitting: Radiology

## 2017-12-17 DIAGNOSIS — K902 Blind loop syndrome, not elsewhere classified: Secondary | ICD-10-CM | POA: Diagnosis present

## 2017-12-17 DIAGNOSIS — I341 Nonrheumatic mitral (valve) prolapse: Secondary | ICD-10-CM | POA: Diagnosis present

## 2017-12-17 DIAGNOSIS — E875 Hyperkalemia: Secondary | ICD-10-CM | POA: Diagnosis present

## 2017-12-17 DIAGNOSIS — K859 Acute pancreatitis without necrosis or infection, unspecified: Secondary | ICD-10-CM | POA: Diagnosis present

## 2017-12-17 DIAGNOSIS — R6521 Severe sepsis with septic shock: Secondary | ICD-10-CM | POA: Diagnosis present

## 2017-12-17 DIAGNOSIS — D649 Anemia, unspecified: Secondary | ICD-10-CM | POA: Diagnosis present

## 2017-12-17 DIAGNOSIS — M19041 Primary osteoarthritis, right hand: Secondary | ICD-10-CM | POA: Diagnosis present

## 2017-12-17 DIAGNOSIS — R6520 Severe sepsis without septic shock: Secondary | ICD-10-CM | POA: Diagnosis not present

## 2017-12-17 DIAGNOSIS — K219 Gastro-esophageal reflux disease without esophagitis: Secondary | ICD-10-CM | POA: Diagnosis present

## 2017-12-17 DIAGNOSIS — Z9841 Cataract extraction status, right eye: Secondary | ICD-10-CM

## 2017-12-17 DIAGNOSIS — J690 Pneumonitis due to inhalation of food and vomit: Secondary | ICD-10-CM | POA: Diagnosis present

## 2017-12-17 DIAGNOSIS — F411 Generalized anxiety disorder: Secondary | ICD-10-CM | POA: Diagnosis present

## 2017-12-17 DIAGNOSIS — Z96653 Presence of artificial knee joint, bilateral: Secondary | ICD-10-CM | POA: Diagnosis present

## 2017-12-17 DIAGNOSIS — K851 Biliary acute pancreatitis without necrosis or infection: Secondary | ICD-10-CM | POA: Diagnosis not present

## 2017-12-17 DIAGNOSIS — Y95 Nosocomial condition: Secondary | ICD-10-CM | POA: Diagnosis present

## 2017-12-17 DIAGNOSIS — E860 Dehydration: Secondary | ICD-10-CM | POA: Diagnosis present

## 2017-12-17 DIAGNOSIS — R109 Unspecified abdominal pain: Secondary | ICD-10-CM | POA: Diagnosis present

## 2017-12-17 DIAGNOSIS — M170 Bilateral primary osteoarthritis of knee: Secondary | ICD-10-CM | POA: Diagnosis present

## 2017-12-17 DIAGNOSIS — R0600 Dyspnea, unspecified: Secondary | ICD-10-CM

## 2017-12-17 DIAGNOSIS — Z8249 Family history of ischemic heart disease and other diseases of the circulatory system: Secondary | ICD-10-CM | POA: Diagnosis not present

## 2017-12-17 DIAGNOSIS — A419 Sepsis, unspecified organism: Secondary | ICD-10-CM | POA: Diagnosis present

## 2017-12-17 DIAGNOSIS — Z9842 Cataract extraction status, left eye: Secondary | ICD-10-CM

## 2017-12-17 DIAGNOSIS — M19042 Primary osteoarthritis, left hand: Secondary | ICD-10-CM | POA: Diagnosis present

## 2017-12-17 DIAGNOSIS — I10 Essential (primary) hypertension: Secondary | ICD-10-CM | POA: Diagnosis present

## 2017-12-17 DIAGNOSIS — R0602 Shortness of breath: Secondary | ICD-10-CM

## 2017-12-17 DIAGNOSIS — R579 Shock, unspecified: Secondary | ICD-10-CM | POA: Diagnosis not present

## 2017-12-17 DIAGNOSIS — K760 Fatty (change of) liver, not elsewhere classified: Secondary | ICD-10-CM | POA: Diagnosis present

## 2017-12-17 DIAGNOSIS — J9601 Acute respiratory failure with hypoxia: Secondary | ICD-10-CM | POA: Diagnosis present

## 2017-12-17 DIAGNOSIS — Z452 Encounter for adjustment and management of vascular access device: Secondary | ICD-10-CM

## 2017-12-17 DIAGNOSIS — Z66 Do not resuscitate: Secondary | ICD-10-CM | POA: Diagnosis present

## 2017-12-17 DIAGNOSIS — K838 Other specified diseases of biliary tract: Secondary | ICD-10-CM | POA: Diagnosis not present

## 2017-12-17 DIAGNOSIS — R945 Abnormal results of liver function studies: Secondary | ICD-10-CM | POA: Diagnosis not present

## 2017-12-17 DIAGNOSIS — K2940 Chronic atrophic gastritis without bleeding: Secondary | ICD-10-CM | POA: Diagnosis present

## 2017-12-17 DIAGNOSIS — E872 Acidosis: Secondary | ICD-10-CM | POA: Diagnosis present

## 2017-12-17 DIAGNOSIS — N170 Acute kidney failure with tubular necrosis: Secondary | ICD-10-CM | POA: Diagnosis present

## 2017-12-17 DIAGNOSIS — E8770 Fluid overload, unspecified: Secondary | ICD-10-CM | POA: Diagnosis present

## 2017-12-17 DIAGNOSIS — J939 Pneumothorax, unspecified: Secondary | ICD-10-CM

## 2017-12-17 DIAGNOSIS — Z833 Family history of diabetes mellitus: Secondary | ICD-10-CM

## 2017-12-17 DIAGNOSIS — Z9851 Tubal ligation status: Secondary | ICD-10-CM

## 2017-12-17 DIAGNOSIS — K8020 Calculus of gallbladder without cholecystitis without obstruction: Secondary | ICD-10-CM | POA: Diagnosis present

## 2017-12-17 DIAGNOSIS — J189 Pneumonia, unspecified organism: Secondary | ICD-10-CM | POA: Clinically undetermined

## 2017-12-17 LAB — COMPREHENSIVE METABOLIC PANEL
ALBUMIN: 4.2 g/dL (ref 3.5–5.0)
ALT: 69 U/L — ABNORMAL HIGH (ref 0–44)
AST: 145 U/L — AB (ref 15–41)
Alkaline Phosphatase: 47 U/L (ref 38–126)
Anion gap: 10 (ref 5–15)
BILIRUBIN TOTAL: 1.7 mg/dL — AB (ref 0.3–1.2)
BUN: 15 mg/dL (ref 8–23)
CO2: 25 mmol/L (ref 22–32)
CREATININE: 1.2 mg/dL — AB (ref 0.44–1.00)
Calcium: 8.7 mg/dL — ABNORMAL LOW (ref 8.9–10.3)
Chloride: 103 mmol/L (ref 98–111)
GFR calc Af Amer: 47 mL/min — ABNORMAL LOW (ref >60)
GFR, EST NON AFRICAN AMERICAN: 41 mL/min — AB (ref >60)
GLUCOSE: 104 mg/dL — AB (ref 70–99)
POTASSIUM: 5 mmol/L (ref 3.5–5.1)
Sodium: 138 mmol/L (ref 135–145)
TOTAL PROTEIN: 7.3 g/dL (ref 6.5–8.1)

## 2017-12-17 LAB — CBC
HCT: 43.2 % (ref 36.0–46.0)
HEMOGLOBIN: 13.1 g/dL (ref 12.0–15.0)
MCH: 27.3 pg (ref 26.0–34.0)
MCHC: 30.3 g/dL (ref 30.0–36.0)
MCV: 90 fL (ref 80.0–100.0)
Platelets: 219 10*3/uL (ref 150–400)
RBC: 4.8 MIL/uL (ref 3.87–5.11)
RDW: 13.7 % (ref 11.5–15.5)
WBC: 5 10*3/uL (ref 4.0–10.5)
nRBC: 0 % (ref 0.0–0.2)

## 2017-12-17 LAB — LIPASE, BLOOD: Lipase: 2513 U/L — ABNORMAL HIGH (ref 11–51)

## 2017-12-17 MED ORDER — ONDANSETRON HCL 4 MG/2ML IJ SOLN
4.0000 mg | Freq: Once | INTRAMUSCULAR | Status: AC
  Administered 2017-12-17: 4 mg via INTRAVENOUS
  Filled 2017-12-17: qty 2

## 2017-12-17 MED ORDER — FENTANYL CITRATE (PF) 100 MCG/2ML IJ SOLN: 50.0000 ug | INTRAMUSCULAR | Status: DC | PRN

## 2017-12-17 MED ORDER — IOPAMIDOL (ISOVUE-300) INJECTION 61%
INTRAVENOUS | Status: AC
  Filled 2017-12-17: qty 100

## 2017-12-17 MED ORDER — PIPERACILLIN-TAZOBACTAM 3.375 G IVPB 30 MIN
3.3750 g | Freq: Once | INTRAVENOUS | Status: AC
  Administered 2017-12-17: 3.375 g via INTRAVENOUS
  Filled 2017-12-17: qty 50

## 2017-12-17 MED ORDER — SODIUM CHLORIDE 0.45 % IV SOLN
INTRAVENOUS | Status: DC
  Administered 2017-12-17: via INTRAVENOUS

## 2017-12-17 MED ORDER — ONDANSETRON HCL 4 MG/2ML IJ SOLN: 4.0000 mg | Freq: Four times a day (QID) | INTRAMUSCULAR | Status: DC | PRN

## 2017-12-17 MED ORDER — ACETAMINOPHEN 650 MG RE SUPP
650.0000 mg | Freq: Four times a day (QID) | RECTAL | Status: DC | PRN
  Administered 2017-12-18: 650 mg via RECTAL
  Filled 2017-12-17: qty 1

## 2017-12-17 MED ORDER — ACETAMINOPHEN 325 MG PO TABS
650.0000 mg | ORAL_TABLET | Freq: Four times a day (QID) | ORAL | Status: DC | PRN
  Administered 2017-12-17: 650 mg via ORAL
  Filled 2017-12-17: qty 2

## 2017-12-17 MED ORDER — SODIUM CHLORIDE 0.9 % IV BOLUS
1000.0000 mL | Freq: Once | INTRAVENOUS | Status: AC
  Administered 2017-12-17: 1000 mL via INTRAVENOUS

## 2017-12-17 MED ORDER — IOPAMIDOL (ISOVUE-300) INJECTION 61%
100.0000 mL | Freq: Once | INTRAVENOUS | Status: AC | PRN
  Administered 2017-12-17: 100 mL via INTRAVENOUS

## 2017-12-17 MED ORDER — ONDANSETRON HCL 4 MG PO TABS: 4.0000 mg | ORAL_TABLET | Freq: Four times a day (QID) | ORAL | Status: DC | PRN

## 2017-12-17 MED ORDER — FENTANYL CITRATE (PF) 100 MCG/2ML IJ SOLN
50.0000 ug | INTRAMUSCULAR | Status: DC | PRN
  Administered 2017-12-17 (×3): 50 ug via INTRAVENOUS
  Filled 2017-12-17 (×3): qty 2

## 2017-12-17 MED ORDER — MORPHINE SULFATE (PF) 2 MG/ML IV SOLN
2.0000 mg | INTRAVENOUS | Status: DC | PRN
  Administered 2017-12-17: 2 mg via INTRAVENOUS
  Administered 2017-12-18: 1 mg via INTRAVENOUS
  Administered 2017-12-18: 2 mg via INTRAVENOUS
  Administered 2017-12-18: 1 mg via INTRAVENOUS
  Administered 2017-12-18: 2 mg via INTRAVENOUS
  Filled 2017-12-17 (×5): qty 1

## 2017-12-17 NOTE — ED Notes (Signed)
US at bedside

## 2017-12-17 NOTE — ED Notes (Signed)
Patient transported to CT 

## 2017-12-17 NOTE — ED Notes (Signed)
ED TO INPATIENT HANDOFF REPORT  Name/Age/Gender [name] [name] 84 y.o. female  Code Status    Code Status Orders  (From admission, onward)         Start     Ordered   11/26/2017 2134  Full code  Continuous     11/22/2017 2136        Code Status History    Date Active Date Inactive Code Status Order ID Comments User Context   06/13/2016 0310 06/19/2016 1737 Full Code [id number]  Norval Morton, MD ED   02/26/2016 1120 02/28/2016 1848 Full Code [id number]  Doreatha Lew, MD ED   03/24/2015 1329 03/27/2015 1640 Full Code [id number]  Theodis Blaze, MD Inpatient   11/13/2014 1535 11/15/2014 1425 Full Code [id number]  Norman Herrlich Inpatient   07/14/2012 1741 07/16/2012 1913 Full Code [id number]  Pricilla Loveless, PA-C Inpatient   04/03/2011 1207 04/06/2011 1631 Full Code [id number]  Arletha Pili, RN Inpatient      Home/SNF/Other Home  Chief Complaint abdominal pain  Level of Care/Admitting Diagnosis ED Disposition    ED Disposition Condition Comment   Admit  Hospital Area: [facility] [[id number]]  Level of Care: Telemetry [5]  Admit to tele based on following criteria: Other see comments  Comments: Hypoxic and tachycardic after pain meds.  Diagnosis: Acute pancreatitis [577.0.ICD-9-CM]  Admitting Physician: Reubin Milan [[id number]]  Attending Physician: Reubin Milan [[id number]]  Estimated length of stay: past midnight tomorrow  Certification:: I certify this patient will need inpatient services for at least 2 midnights  PT Class (Do Not Modify): Inpatient [101]  PT Acc Code (Do Not Modify): Private [1]       Medical History Past Medical History:  Diagnosis Date  . Allergy    SEASONAL  . Anemia   . Anxiety   . Arthritis    KNEES/HANDS  . 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 Blind loop syndrome   . Complication of anesthesia    pt can't remember exact complication, but she remembers about 30 years ago she may  have had trouble waking up from anesthesia.  . Fatty liver   . GERD (gastroesophageal reflux disease)   . Hiatal hernia   . Hypertension   . MVP (mitral valve prolapse)   . PVC's (premature ventricular contractions)   . Schatzki's ring   . Small bowel obstruction ([place])   . Vitamin B12 deficiency     Allergies No Known Allergies  IV Location/Drains/Wounds Patient Lines/Drains/Airways Status   Active Line/Drains/Airways    Name:   Placement date:   Placement time:   Site:   Days:   Peripheral IV 12/11/2017 Right;Upper Arm   11/29/2017    1841    Arm   less than 1          Labs/Imaging Results for orders placed or performed during the hospital encounter of 12/13/2017 (from the past 48 hour(s))  Lipase, blood     Status: Abnormal   Collection Time: 12/01/2017  4:18 PM  Result Value Ref Range   Lipase 2,513 (H) 11 - 51 U/L    Comment: RESULTS CONFIRMED BY MANUAL DILUTION Performed at [facility], [place] [address], [place], [place] [address]   Comprehensive metabolic panel     Status: Abnormal   Collection Time: 11/19/2017  4:18 PM  Result Value Ref Range   Sodium 138 135 - 145 mmol/L   Potassium 5.0 3.5 - 5.1  mmol/L    Comment: MODERATE HEMOLYSIS   Chloride 103 98 - 111 mmol/L   CO2 25 22 - 32 mmol/L   Glucose, Bld 104 (H) 70 - 99 mg/dL   BUN 15 8 - 23 mg/dL   Creatinine, Ser 1.20 (H) 0.44 - 1.00 mg/dL   Calcium 8.7 (L) 8.9 - 10.3 mg/dL   Total Protein 7.3 6.5 - 8.1 g/dL   Albumin 4.2 3.5 - 5.0 g/dL   AST 145 (H) 15 - 41 U/L   ALT 69 (H) 0 - 44 U/L   Alkaline Phosphatase 47 38 - 126 U/L   Total Bilirubin 1.7 (H) 0.3 - 1.2 mg/dL   GFR calc non Af Amer 41 (L) >60 mL/min   GFR calc Af Amer 47 (L) >60 mL/min   Anion gap 10 5 - 15    Comment: Performed at [facility], [place] [address] [place], [place] [address]  CBC     Status: None   Collection Time: 12/12/2017  4:18 PM  Result Value Ref Range   WBC 5.0 4.0 - 10.5 K/uL   RBC 4.80 3.87 - 5.11  MIL/uL   Hemoglobin 13.1 12.0 - 15.0 g/dL   HCT 43.2 36.0 - 46.0 %   MCV 90.0 80.0 - 100.0 fL   MCH 27.3 26.0 - 34.0 pg   MCHC 30.3 30.0 - 36.0 g/dL   RDW 13.7 11.5 - 15.5 %   Platelets 219 150 - 400 K/uL   nRBC 0.0 0.0 - 0.2 %    Comment: Performed at [facility], [place] [address] [place], [place] [address]   Ct Abdomen Pelvis W Contrast  Result Date: 12/16/2017 CLINICAL DATA:  Abdominal pain and distention. EXAM: CT ABDOMEN AND PELVIS WITH CONTRAST TECHNIQUE: Multidetector CT imaging of the abdomen and pelvis was performed using the standard protocol following bolus administration of intravenous contrast. CONTRAST:  133mL ISOVUE-300 IOPAMIDOL (ISOVUE-300) INJECTION 61% COMPARISON:  06/13/2016 FINDINGS: Lower chest: Bilateral lung base airspace opacities, most evident in the lower lobes. This may all be atelectasis. Consider pneumonia in the proper clinical setting. Hepatobiliary: There is intra and extrahepatic bile duct dilation. Common bile duct measures a maximum of 13 mm. Duct is dilated into the pancreatic head, where it intersects a medial duodenal diverticulum. There is milder intra hepatic bile duct dilation. Biliary dilation is similar to the prior CT. 7 mm low-density lesion is noted near the dome of the right lobe, stable consistent with a cyst. No other liver masses or lesions. Liver demonstrates diffusely decreased attenuation consistent with fatty infiltration. Gallbladder is significantly distended. There is no definitive wall thickening and no visualized stones. Pancreas: Mild dilation of the pancreatic duct to a maximum of 3 mm similar to the prior CT. No pancreatic mass or inflammation. Spleen: Normal in size without focal abnormality. Adrenals/Urinary Tract: Mild adrenal gland thickening. No discrete masses. This is stable. Mild bilateral renal cortical thinning. No renal masses, stones or hydronephrosis. There is symmetric renal enhancement and excretion.  Ureters are normal in course and in caliber. Bladder is unremarkable. Stomach/Bowel: Stomach is mild-to-moderately distended. No wall thickening or convincing inflammation. Small bowel is normal in caliber. No wall thickening or inflammation. There are scattered colonic diverticula. No diverticulitis. No colonic wall thickening or other inflammatory process. An anastomosis staple line lies along the inferior right colon stable from the prior CT. Vascular/Lymphatic: No enlarged lymph nodes. Aortic atherosclerosis. No aneurysm. Reproductive: Uterus and bilateral adnexa are unremarkable. Other: There is  a small amount of ascites, which is predominantly adjacent to the liver and distended gallbladder but also lies along the leaves of the mesentery. Musculoskeletal: No fracture or acute finding. No osteoblastic or osteolytic lesions. IMPRESSION: 1. Gallbladder is distended and there is a small amount of ascites that is centered in the right upper quadrant. Although there is no convincing gallbladder wall thickening and no visualized stone, the findings raise suspicion for acute cholecystitis. Recommend follow-up limited right upper quadrant ultrasound for further assessment. 2. There is intra and extrahepatic bile duct dilation. This is similar to the prior CT, and therefore presumed chronic. No visualized stone or obstructing mass. There is a medial duodenal diverticulum at the level of the ampullary. 3. Bilateral lung base opacities, mostly in the lower lobes, which may all reflect atelectasis. Consider pneumonia in the proper clinical setting. 4. No other evidence an acute abnormality. No bowel obstruction or inflammation. Electronically Signed   By: Lajean Manes M.D.   On: 12/07/2017 19:49   US Abdomen Limited Ruq  Result Date: 11/25/2017 CLINICAL DATA:  Patient with abdominal pain beginning today. Distended gallbladder and ascites noted on CT. Patient with elevated liver enzymes and elevated lipase. EXAM:  ULTRASOUND ABDOMEN LIMITED RIGHT UPPER QUADRANT COMPARISON:  Current abdomen and pelvis CT. FINDINGS: Gallbladder: The gallbladder is distended, but contains no stones. There is no sludge. No wall thickening. There is pericholecystic fluid, which is part of the ascites. Common bile duct: Diameter: 12 mm.  No duct stone visualized. Liver: Increased echogenicity consistent with hepatic steatosis as noted on CT. No mass or focal lesion. Small cyst at the dome of the right lobe not visualized sonographically. Portal vein is patent on color Doppler imaging with normal direction of blood flow towards the liver. Fluid noted adjacent to the liver mostly along its inferior margin, as well as adjacent to the gallbladder. IMPRESSION: 1. No sonographic findings acute cholecystitis. Gallbladder is distended, but there is no wall thickening. No gallstones. 2. Dilated common bile duct. This is presumed chronic since it is similar to previous CT scans. No duct stone visualized. 3. Ascites as noted on the current CT. 4. Hepatic steatosis. Electronically Signed   By: Lajean Manes M.D.   On: 11/25/2017 21:10    Pending Labs Unresulted Labs (From admission, onward)    Start     Ordered   12/18/17 0500  CBC WITH DIFFERENTIAL  Daily,   R     12/15/2017 2136   12/18/17 0500  Hepatic function panel  Daily,   R     11/29/2017 2136   12/18/17 0867  Basic metabolic panel  Daily,   R     11/21/2017 2136   12/16/2017 1513  Urinalysis, Routine w reflex microscopic  ONCE - STAT,   STAT     11/27/2017 1513          Vitals/Pain Today's Vitals   11/25/2017 1810 12/18/2017 1851 11/21/2017 2100 12/10/2017 2115  BP:  126/67 130/73   Pulse:  91 (!) 104   Resp:  20 20   Temp:      TempSrc:      SpO2:  94% (!) 88%   Weight:      Height:      PainSc: 10-Worst pain ever   10-Worst pain ever    Isolation Precautions No active isolations  Medications Medications  fentaNYL (SUBLIMAZE) injection 50 mcg (50 mcg Intravenous Given 11/20/2017  2121)  iopamidol (ISOVUE-300) 61 % injection (has no administration in  time range)  ondansetron (ZOFRAN) tablet 4 mg (has no administration in time range)    Or  ondansetron (ZOFRAN) injection 4 mg (has no administration in time range)  acetaminophen (TYLENOL) tablet 650 mg (has no administration in time range)    Or  acetaminophen (TYLENOL) suppository 650 mg (has no administration in time range)  ondansetron (ZOFRAN) injection 4 mg (4 mg Intravenous Given 11/22/2017 1845)  sodium chloride 0.9 % bolus 1,000 mL (0 mLs Intravenous Stopped 12/18/2017 2044)  iopamidol (ISOVUE-300) 61 % injection 100 mL (100 mLs Intravenous Contrast Given 12/06/2017 1918)  piperacillin-tazobactam (ZOSYN) IVPB 3.375 g (3.375 g Intravenous New Bag/Given 11/29/2017 2114)    Mobility non-ambulatory

## 2017-12-17 NOTE — Plan of Care (Signed)
Plan of care discussed.   

## 2017-12-17 NOTE — ED Notes (Signed)
Stuck patient for IV access, pt hard stick. [employer] RN will come with ultrasound.

## 2017-12-17 NOTE — ED Triage Notes (Signed)
Pt reports acute onset abd pain that began at noon.  Pt reports emesis.  Pt has had severe pain like this in the past and was told it was b/c she had "bad guts."

## 2017-12-17 NOTE — ED Notes (Signed)
Two unsuccessful [place] IV.

## 2017-12-17 NOTE — H&P (Signed)
History and Physical    [name] [name] IOX:[id number] DOB: 07-30-1928 DOA: 12/07/2017  PCP: [name], MD   Patient coming from: Home.  I have personally briefly reviewed patient's old medical records in [place]  Chief Complaint: Abdominal pain.  HPI: [name] [name] is a 84 y.o. female with medical history significant of seasonal allergies, anemia, anxiety, osteoarthritis of knees and hands, atrophic gastritis, blind loop syndrome, history of bowel obstructions, fatty liver disease, GERD, hiatal hernia, hypertension, mitral valve prolapse, history of PVCs, vitamin B12 deficiency who is coming to the emergency department with complaint of progressively worse abdominal pain that began around noon time and was associated with severe dry heaving.  She denies diarrhea, constipation, melena or hematochezia.  She denies dysuria, frequency or hematuria.  She denies fever, chills but feels fatigued.  No headache, sore throat rhinorrhea, productive cough, wheezing or hemoptysis.  Denies chest pain but complains of mild dyspnea and diaphoresis.  She denies dizziness, PND, orthopnea or recent pitting edema of the lower extremities.  No polyuria, polydipsia or polyphagia.  Denies skin pruritus.  ED Course: Initial vital signs in the emergency department temperature 98.3 F, pulse 82, respirations 18, blood pressure 137/57 mmHg and O2 sat 95% on room air.  The patient received a dose of Zosyn 3.375 g, fentanyl 50 mcg IVP and Zofran 4 mg IVP in the emergency department.  Her CBC showed a white count of 5.0, hemoglobin 13.1 g/dL and platelets 219.  CMP shows normal electrolytes except for a decreased calcium of 8.7 mg/dL.  BUN was 15, glucose 104, creatinine 1.20 and bilirubin 1.7 mg/dL.  AST was 145 and ALT 69 units/L.  Total protein, albumin and alkaline phosphatase were normal.  Imaging: CT abdomen/pelvis with contrast showed a distended gallbladder with a small amount of ascites that is  entering the right upper quadrant.  Although there is no convincing gallbladder wall thickening and no visualized stone, the findings raise suspicion for acute cholecystitis.  Ultrasound was recommended.  There was intra-and extrahepatic bile ductal dilation which is similar to the prior CT and therefore presumed to be chronic.  There was no stone or obstructing mass.  There is a medial duodenal diverticulum at the level of the ampullary.  Bilateral lung base opacities, mostly in the lower lobes which may all reflect atelectasis.  Consider pneumonia in the proper clinical setting.  RUQ ultrasound did not show any sonographic findings of acute cholecystitis.  The gallbladder is distended, but there is no wall thickening or gallstones.  The dilated common bile duct is presumed to be chronic since it is been there on previous CT scans.  There was no cholelithiasis visualized.  There was sinusitis as seen on recent CT.  There was hepatic steatosis.  Please see imaging and full radiology report for further detail.  Review of Systems: As per HPI otherwise 10 point review of systems negative.   Past Medical History:  Diagnosis Date  . Allergy    SEASONAL  . Anemia   . Anxiety   . Arthritis    KNEES/HANDS  . 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 Blind loop syndrome   . Complication of anesthesia    pt can't remember exact complication, but she remembers about 30 years ago she may have had trouble waking up from anesthesia.  . Fatty liver   . GERD (gastroesophageal reflux disease)   . Hiatal hernia   . Hypertension   . MVP (mitral valve prolapse)   .  PVC's (premature ventricular contractions)   . Schatzki's ring   . Small bowel obstruction ([place])   . Vitamin B12 deficiency     Past Surgical History:  Procedure Laterality Date  . CATARACT EXTRACTION, BILATERAL  03-26-11  . HEMICOLECTOMY     and ileectomy Dr Vida Rigger; due to perforated viscus  . I&D KNEE WITH POLY EXCHANGE  04/03/2011     Procedure: IRRIGATION AND DEBRIDEMENT KNEE WITH POLY EXCHANGE;  Surgeon: Mcarthur Rossetti, MD;  Location: [facility];  Service: Orthopedics;  Laterality: Right;  . KNEE CLOSED REDUCTION  01/22/2011   Procedure: CLOSED MANIPULATION KNEE;  Surgeon: Mcarthur Rossetti;  Location: [facility];  Service: Orthopedics;  Laterality: Right;  Manipulation under anesthesia right knee  . Nuclear stress test  Sept 2012   Normal  . REPLACEMENT TOTAL KNEE     right  . ROTATOR CUFF REPAIR  03-26-11   right arm  . SYNOVECTOMY  04/03/2011   Procedure: SYNOVECTOMY;  Surgeon: Mcarthur Rossetti, MD;  Location: [facility];  Service: Orthopedics;  Laterality: Right;  . TOTAL KNEE ARTHROPLASTY Left 11/13/2014   Procedure: TOTAL KNEE ARTHROPLASTY;  Surgeon: Paralee Cancel, MD;  Location: [facility];  Service: Orthopedics;  Laterality: Left;  . TOTAL KNEE REVISION Right 07/14/2012   Procedure: RIGHT TOTAL KNEE REVISION;  Surgeon: Mauri Pole, MD;  Location: [facility];  Service: Orthopedics;  Laterality: Right;  . TUBAL LIGATION       reports that she has never smoked. She has never used smokeless tobacco. She reports that she does not drink alcohol or use drugs.  No Known Allergies  Family History  Problem Relation Age of Onset  . Hypertension Sister   . Diabetes Sister   . Diabetes Brother   . Coronary artery disease Brother   . Deep vein thrombosis Sister   . Colon cancer Neg Hx   . Colon polyps Neg Hx    Prior to Admission medications   Medication Sig Start Date End Date Taking? Authorizing Provider  acetaminophen (TYLENOL) 500 MG tablet Take 1,000 mg by mouth every 6 (six) hours as needed for moderate pain.   Yes [provider]  alprazolam Duanne Moron) 2 MG tablet TAKE 2 TABLETS AT BEDTIME AS NEEDED FOR SLEEP 07/05/17  Yes [provider]  amLODipine (NORVASC) 10 MG tablet Take 10 mg by mouth every morning.  11/01/17  Yes [provider]  calcium carbonate (TUMS - DOSED IN MG ELEMENTAL  CALCIUM) 500 MG chewable tablet Chew 1 tablet by mouth daily as needed for indigestion or heartburn.   Yes [provider]  DEXILANT 60 MG capsule Take 60 mg by mouth daily.  07/03/17  Yes [provider]  famotidine (PEPCID) 20 MG tablet Take 1 tablet (20 mg total) by mouth daily. 06/19/16 12/01/2017 Yes Doreatha Lew, MD  loratadine (CLARITIN) 10 MG tablet Take 10 mg by mouth daily as needed for allergies.    Yes [provider]  losartan (COZAAR) 25 MG tablet Take 25 mg by mouth every morning. 11/01/17  Yes [provider]  potassium chloride (K-DUR) 10 MEQ tablet Take 1 tablet (10 mEq total) by mouth 2 (two) times daily. 06/19/16 12/16/2017 Yes Doreatha Lew, MD  traMADol (ULTRAM) 50 MG tablet Take 50 mg by mouth every 6 (six) hours as needed for moderate pain or severe pain.  07/08/17  Yes [provider]  Vitamin D, Ergocalciferol, (DRISDOL) 50000 UNITS CAPS Take 50,000 Units by mouth every 7 (  seven) days. Friday   Yes [provider]  ALPRAZolam (XANAX) 0.25 MG tablet Take 1 tablet (0.25 mg total) by mouth 4 (four) times daily as needed. ANXIETY/ SPASM Patient not taking: Reported on 11/19/2017 07/15/12   Danae Orleans, PA-C  polyethylene glycol [facility]) packet Take 17 g by mouth daily as needed for mild constipation. Patient not taking: Reported on 07/10/2017 02/28/16   Barton Dubois, MD    Physical Exam: Vitals:   12/09/2017 1514 12/12/2017 1739 12/11/2017 1851 12/15/2017 2100  BP:  122/85 126/67 130/73  Pulse:  99 91 (!) 104  Resp:  20 20 20   Temp: 98.3 F (36.8 C) 98.4 F (36.9 C)    TempSrc: Axillary Axillary    SpO2:  95% 94% (!) 88%  Weight:      Height:        Constitutional: Looks acutely ill. Eyes: PERRL, lids and conjunctivae normal ENMT: Mucous membranes are dry. Posterior pharynx clear of any exudate or lesions. Neck: normal, supple, no masses, no thyromegaly Respiratory: Tachypneic with shallow respirations, but no  wheezing, rhonchi or crackles.  There was no accessory muscle use.  Cardiovascular: Tachycardic at 112 bpm, no murmurs / rubs / gallops. No extremity edema. 2+ pedal pulses. No carotid bruits.  Abdomen: Nondistended.  Bowel sounds positive. Soft, positive epigastric tenderness, no guarding or rebound, no masses palpated. No hepatosplenomegaly.  Musculoskeletal: no clubbing / cyanosis. Good ROM, no contractures. Normal muscle tone.  Skin: Initially diaphoretic due to pain. Neurologic: CN 2-12 grossly intact. Sensation intact, DTR normal. Strength 5/5 in all 4.  Psychiatric: Normal judgment and insight. Alert and oriented x 3.  Anxious mood.   Labs on Admission: I have personally reviewed following labs and imaging studies  CBC: Recent Labs  Lab 11/27/2017 1618  WBC 5.0  HGB 13.1  HCT 43.2  MCV 90.0  PLT 213   Basic Metabolic Panel: Recent Labs  Lab 11/26/2017 1618  NA 138  K 5.0  CL 103  CO2 25  GLUCOSE 104*  BUN 15  CREATININE 1.20*  CALCIUM 8.7*   GFR: Estimated Creatinine Clearance: 34.8 mL/min (A) (by C-G formula based on SCr of 1.2 mg/dL (H)). Liver Function Tests: Recent Labs  Lab 11/25/2017 1618  AST 145*  ALT 69*  ALKPHOS 47  BILITOT 1.7*  PROT 7.3  ALBUMIN 4.2   Recent Labs  Lab 12/06/2017 1618  LIPASE 2,513*   No results for input(s): AMMONIA in the last 168 hours. Coagulation Profile: No results for input(s): INR, PROTIME in the last 168 hours. Cardiac Enzymes: No results for input(s): CKTOTAL, CKMB, CKMBINDEX, TROPONINI in the last 168 hours. BNP (last 3 results) No results for input(s): PROBNP in the last 8760 hours. HbA1C: No results for input(s): HGBA1C in the last 72 hours. CBG: No results for input(s): GLUCAP in the last 168 hours. Lipid Profile: No results for input(s): CHOL, HDL, LDLCALC, TRIG, CHOLHDL, LDLDIRECT in the last 72 hours. Thyroid Function Tests: No results for input(s): TSH, T4TOTAL, FREET4, T3FREE, THYROIDAB in the last 72  hours. Anemia Panel: No results for input(s): VITAMINB12, FOLATE, FERRITIN, TIBC, IRON, RETICCTPCT in the last 72 hours. Urine analysis:    Component Value Date/Time   COLORURINE YELLOW 06/13/2016 0026   APPEARANCEUR CLEAR 06/13/2016 0026   LABSPEC 1.021 06/13/2016 0026   PHURINE 8.0 06/13/2016 0026   GLUCOSEU NEGATIVE 06/13/2016 0026   HGBUR NEGATIVE 06/13/2016 0026   BILIRUBINUR NEGATIVE 06/13/2016 0026   KETONESUR NEGATIVE 06/13/2016 0026   PROTEINUR  100 (A) 06/13/2016 0026   UROBILINOGEN 0.2 11/02/2014 0900   NITRITE NEGATIVE 06/13/2016 0026   LEUKOCYTESUR TRACE (A) 06/13/2016 0026    Radiological Exams on Admission: Ct Abdomen Pelvis W Contrast  Result Date: 12/09/2017 CLINICAL DATA:  Abdominal pain and distention. EXAM: CT ABDOMEN AND PELVIS WITH CONTRAST TECHNIQUE: Multidetector CT imaging of the abdomen and pelvis was performed using the standard protocol following bolus administration of intravenous contrast. CONTRAST:  15mL ISOVUE-300 IOPAMIDOL (ISOVUE-300) INJECTION 61% COMPARISON:  06/13/2016 FINDINGS: Lower chest: Bilateral lung base airspace opacities, most evident in the lower lobes. This may all be atelectasis. Consider pneumonia in the proper clinical setting. Hepatobiliary: There is intra and extrahepatic bile duct dilation. Common bile duct measures a maximum of 13 mm. Duct is dilated into the pancreatic head, where it intersects a medial duodenal diverticulum. There is milder intra hepatic bile duct dilation. Biliary dilation is similar to the prior CT. 7 mm low-density lesion is noted near the dome of the right lobe, stable consistent with a cyst. No other liver masses or lesions. Liver demonstrates diffusely decreased attenuation consistent with fatty infiltration. Gallbladder is significantly distended. There is no definitive wall thickening and no visualized stones. Pancreas: Mild dilation of the pancreatic duct to a maximum of 3 mm similar to the prior CT. No  pancreatic mass or inflammation. Spleen: Normal in size without focal abnormality. Adrenals/Urinary Tract: Mild adrenal gland thickening. No discrete masses. This is stable. Mild bilateral renal cortical thinning. No renal masses, stones or hydronephrosis. There is symmetric renal enhancement and excretion. Ureters are normal in course and in caliber. Bladder is unremarkable. Stomach/Bowel: Stomach is mild-to-moderately distended. No wall thickening or convincing inflammation. Small bowel is normal in caliber. No wall thickening or inflammation. There are scattered colonic diverticula. No diverticulitis. No colonic wall thickening or other inflammatory process. An anastomosis staple line lies along the inferior right colon stable from the prior CT. Vascular/Lymphatic: No enlarged lymph nodes. Aortic atherosclerosis. No aneurysm. Reproductive: Uterus and bilateral adnexa are unremarkable. Other: There is a small amount of ascites, which is predominantly adjacent to the liver and distended gallbladder but also lies along the leaves of the mesentery. Musculoskeletal: No fracture or acute finding. No osteoblastic or osteolytic lesions. IMPRESSION: 1. Gallbladder is distended and there is a small amount of ascites that is centered in the right upper quadrant. Although there is no convincing gallbladder wall thickening and no visualized stone, the findings raise suspicion for acute cholecystitis. Recommend follow-up limited right upper quadrant ultrasound for further assessment. 2. There is intra and extrahepatic bile duct dilation. This is similar to the prior CT, and therefore presumed chronic. No visualized stone or obstructing mass. There is a medial duodenal diverticulum at the level of the ampullary. 3. Bilateral lung base opacities, mostly in the lower lobes, which may all reflect atelectasis. Consider pneumonia in the proper clinical setting. 4. No other evidence an acute abnormality. No bowel obstruction or  inflammation. Electronically Signed   By: Lajean Manes M.D.   On: 11/30/2017 19:49   US Abdomen Limited Ruq  Result Date: 12/11/2017 CLINICAL DATA:  Patient with abdominal pain beginning today. Distended gallbladder and ascites noted on CT. Patient with elevated liver enzymes and elevated lipase. EXAM: ULTRASOUND ABDOMEN LIMITED RIGHT UPPER QUADRANT COMPARISON:  Current abdomen and pelvis CT. FINDINGS: Gallbladder: The gallbladder is distended, but contains no stones. There is no sludge. No wall thickening. There is pericholecystic fluid, which is part of the ascites. Common bile duct: Diameter:  12 mm.  No duct stone visualized. Liver: Increased echogenicity consistent with hepatic steatosis as noted on CT. No mass or focal lesion. Small cyst at the dome of the right lobe not visualized sonographically. Portal vein is patent on color Doppler imaging with normal direction of blood flow towards the liver. Fluid noted adjacent to the liver mostly along its inferior margin, as well as adjacent to the gallbladder. IMPRESSION: 1. No sonographic findings acute cholecystitis. Gallbladder is distended, but there is no wall thickening. No gallstones. 2. Dilated common bile duct. This is presumed chronic since it is similar to previous CT scans. No duct stone visualized. 3. Ascites as noted on the current CT. 4. Hepatic steatosis. Electronically Signed   By: Lajean Manes M.D.   On: 11/30/2017 21:10    EKG: Independently reviewed.  Ordered.  Still not obtained.  Assessment/Plan Principal Problem:   Acute pancreatitis Admited initially to telemetry then transferred to stepdown due to hypotension, tachycardia and tachypnea.  Has responded to fluids and partial response to analgesics.  We will keep n.p.o. for now.  Continue IV fluids.  Continue analgesics and antiemetics as needed.  Will follow CBC, CMP and lipase level.  Will continue Zosyn given her seizures/sepsis-like symptoms in the presence of a high lipase  level.  Active Problems:   Essential hypertension Hold antihypertensives for now given hypotension. Continue IV fluids and monitor blood pressure closely.    Gastroesophageal reflux disease Famotidine 20 mg IVPB every 12 hours.    Generalized anxiety disorder Hold alprazolam while on IV opiate analgesics.   DVT prophylaxis: SCDs. Code Status: Full code. Family Communication: Disposition Plan: Admit for acute pancreatitis treatment. Consults called: Admission status: Inpatient/telemetry.   Reubin Milan MD [facility] Pager [phone].  If 7PM-7AM, please contact night-coverage [email or web address] Password [email or web address]  11/28/2017, 9:50 PM

## 2017-12-17 NOTE — ED Provider Notes (Signed)
[facility] DEPT Provider Note   CSN: [id number] Arrival date & time: 11/25/2017  1438     History   Chief Complaint Chief Complaint  Patient presents with  . Abdominal Pain    HPI [name] [name] is a 84 y.o. female.  The history is provided by the patient, a relative and medical records. No language interpreter was used.  Abdominal Pain     [name] [name] is a 84 y.o. female who presents to the Emergency Department complaining of abdominal pain. She presents to the emergency department complaining of severe generalized abdominal pain that began abruptly at 12 today. She was feeling well prior to pain beginning. She has associated nausea with dry heaves. No fevers, difficulty breathing, diarrhea, constipation, dysuria. She has a history of similar symptoms in the past and was told she had a bowel obstruction. Past Medical History:  Diagnosis Date  . Allergy    SEASONAL  . Anemia   . Anxiety   . Arthritis    KNEES/HANDS  . 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 Blind loop syndrome   . Complication of anesthesia    pt can't remember exact complication, but she remembers about 30 years ago she may have had trouble waking up from anesthesia.  . Fatty liver   . GERD (gastroesophageal reflux disease)   . Hiatal hernia   . Hypertension   . MVP (mitral valve prolapse)   . PVC's (premature ventricular contractions)   . Schatzki's ring   . Small bowel obstruction ([place])   . Vitamin B12 deficiency     Patient Active Problem List   Diagnosis Date Noted  . Acute pancreatitis 12/16/2017  . Ileus ([place]) 06/14/2016  . Ileitis 06/13/2016  . Nausea and vomiting 06/13/2016  . Generalized abdominal pain 06/13/2016  . Generalized anxiety disorder 02/26/2016  . Aspiration pneumonia of right lower lobe ([place])   . Small bowel obstruction ([place])   . RLL pneumonia ([place]) 03/26/2015  . SBO (small bowel obstruction) ([place]) 03/24/2015  . S/P  left TKA 11/13/2014  . S/P knee replacement 11/13/2014  . Overweight (BMI 25.0-29.9) 07/15/2012  . Expected blood loss anemia 07/15/2012  . S/P right TK revision 07/14/2012  . Ankylosis of knee joint 04/03/2011  . Fibrosis of knee joint 01/22/2011  . Chest pain 10/20/2010  . Bacterial overgrowth syndrome 10/09/2010  . Status post right hemicolectomy 10/09/2010  . Esophageal dysphagia 10/09/2010  . B12 deficiency 10/09/2010  . DYSPHAGIA UNSPECIFIED 01/23/2010  . BLIND LOOP SYNDROME 08/26/2009  . GASTRITIS 12/26/2008  . WEIGHT LOSS-ABNORMAL 12/25/2008  . DYSPHAGIA 12/25/2008  . DIARRHEA 07/17/2008  . HOARSENESS 07/26/2007  . B12 DEFICIENCY 07/25/2007  . Essential hypertension 07/25/2007  . MITRAL VALVE PROLAPSE 07/25/2007  . PREMATURE VENTRICULAR CONTRACTIONS 07/25/2007  . ESOPHAGITIS, REFLUX 07/25/2007  . Gastroesophageal reflux disease 07/25/2007  . GASTRITIS, CHRONIC 07/25/2007  . HIATAL HERNIA 07/25/2007  . ABSCESS OF INTESTINE 07/25/2007    Past Surgical History:  Procedure Laterality Date  . CATARACT EXTRACTION, BILATERAL  03-26-11  . HEMICOLECTOMY     and ileectomy Dr Vida Rigger; due to perforated viscus  . I&D KNEE WITH POLY EXCHANGE  04/03/2011   Procedure: IRRIGATION AND DEBRIDEMENT KNEE WITH POLY EXCHANGE;  Surgeon: Mcarthur Rossetti, MD;  Location: [facility];  Service: Orthopedics;  Laterality: Right;  . KNEE CLOSED REDUCTION  01/22/2011   Procedure: CLOSED MANIPULATION KNEE;  Surgeon: Mcarthur Rossetti;  Location: [facility];  Service: Orthopedics;  Laterality:  Right;  Manipulation under anesthesia right knee  . Nuclear stress test  Sept 2012   Normal  . REPLACEMENT TOTAL KNEE     right  . ROTATOR CUFF REPAIR  03-26-11   right arm  . SYNOVECTOMY  04/03/2011   Procedure: SYNOVECTOMY;  Surgeon: Mcarthur Rossetti, MD;  Location: [facility];  Service: Orthopedics;  Laterality: Right;  . TOTAL KNEE ARTHROPLASTY Left 11/13/2014   Procedure: TOTAL KNEE ARTHROPLASTY;   Surgeon: Paralee Cancel, MD;  Location: [facility];  Service: Orthopedics;  Laterality: Left;  . TOTAL KNEE REVISION Right 07/14/2012   Procedure: RIGHT TOTAL KNEE REVISION;  Surgeon: Mauri Pole, MD;  Location: [facility];  Service: Orthopedics;  Laterality: Right;  . TUBAL LIGATION       OB History   None      Home Medications    Prior to Admission medications   Medication Sig Start Date End Date Taking? Authorizing Provider  acetaminophen (TYLENOL) 500 MG tablet Take 1,000 mg by mouth every 6 (six) hours as needed for moderate pain.   Yes [provider]  alprazolam Duanne Moron) 2 MG tablet TAKE 2 TABLETS AT BEDTIME AS NEEDED FOR SLEEP 07/05/17  Yes [provider]  amLODipine (NORVASC) 10 MG tablet Take 10 mg by mouth every morning.  11/01/17  Yes [provider]  calcium carbonate (TUMS - DOSED IN MG ELEMENTAL CALCIUM) 500 MG chewable tablet Chew 1 tablet by mouth daily as needed for indigestion or heartburn.   Yes [provider]  DEXILANT 60 MG capsule Take 60 mg by mouth daily.  07/03/17  Yes [provider]  famotidine (PEPCID) 20 MG tablet Take 1 tablet (20 mg total) by mouth daily. 06/19/16 11/23/2017 Yes Doreatha Lew, MD  loratadine (CLARITIN) 10 MG tablet Take 10 mg by mouth daily as needed for allergies.    Yes [provider]  losartan (COZAAR) 25 MG tablet Take 25 mg by mouth every morning. 11/01/17  Yes [provider]  potassium chloride (K-DUR) 10 MEQ tablet Take 1 tablet (10 mEq total) by mouth 2 (two) times daily. 06/19/16 11/29/2017 Yes Doreatha Lew, MD  traMADol (ULTRAM) 50 MG tablet Take 50 mg by mouth every 6 (six) hours as needed for moderate pain or severe pain.  07/08/17  Yes [provider]  Vitamin D, Ergocalciferol, (DRISDOL) 50000 UNITS CAPS Take 50,000 Units by mouth every 7 (seven) days. Friday   Yes [provider]  ALPRAZolam (XANAX) 0.25 MG tablet Take 1 tablet (0.25 mg total) by  mouth 4 (four) times daily as needed. ANXIETY/ SPASM Patient not taking: Reported on 12/14/2017 07/15/12   Danae Orleans, PA-C  polyethylene glycol [facility]) packet Take 17 g by mouth daily as needed for mild constipation. Patient not taking: Reported on 07/10/2017 02/28/16   Barton Dubois, MD    Family History Family History  Problem Relation Age of Onset  . Hypertension Sister   . Diabetes Sister   . Diabetes Brother   . Coronary artery disease Brother   . Deep vein thrombosis Sister   . Colon cancer Neg Hx   . Colon polyps Neg Hx     Social History Social History   Tobacco Use  . Smoking status: Never Smoker  . Smokeless tobacco: Never Used  Substance Use Topics  . Alcohol use: No  . Drug use: No     Allergies   Patient has no known allergies.   Review of Systems Review of Systems  Gastrointestinal: Positive for abdominal pain.  All other systems reviewed and are negative.    Physical Exam Updated Vital Signs BP 118/68 (BP Location: Left Arm)   Pulse (!) 104   Temp (!) 97.3 F (36.3 C)   Resp (!) 21   Ht 5\' 5"  (1.651 m)   Wt 81.6 kg   SpO2 97%   BMI 29.95 kg/m   Physical Exam  Constitutional: She is oriented to person, place, and time. She appears well-developed and well-nourished. She appears distressed.  HENT:  Head: Normocephalic and atraumatic.  Cardiovascular: Normal rate and regular rhythm.  No murmur heard. Pulmonary/Chest: Effort normal and breath sounds normal. No respiratory distress.  Abdominal: Soft. There is no rebound and no guarding.  Moderate generalized abdominal tenderness without guarding or rebound.  Hypoactive bowel sounds.    Musculoskeletal: She exhibits no edema or tenderness.  Neurological: She is alert and oriented to person, place, and time.  Skin: Skin is warm. She is diaphoretic.  Psychiatric: She has a normal mood and affect. Her behavior is normal.  Nursing note and vitals reviewed.    ED Treatments / Results    Labs (all labs ordered are listed, but only abnormal results are displayed) Labs Reviewed  LIPASE, BLOOD - Abnormal; Notable for the following components:      Result Value   Lipase 2,513 (*)    All other components within normal limits  COMPREHENSIVE METABOLIC PANEL - Abnormal; Notable for the following components:   Glucose, Bld 104 (*)    Creatinine, Ser 1.20 (*)    Calcium 8.7 (*)    AST 145 (*)    ALT 69 (*)    Total Bilirubin 1.7 (*)    GFR calc non Af Amer 41 (*)    GFR calc Af Amer 47 (*)    All other components within normal limits  CBC  URINALYSIS, ROUTINE W REFLEX MICROSCOPIC  CBC WITH DIFFERENTIAL/PLATELET  HEPATIC FUNCTION PANEL  BASIC METABOLIC PANEL    EKG None  Radiology Ct Abdomen Pelvis W Contrast  Result Date: 12/08/2017 CLINICAL DATA:  Abdominal pain and distention. EXAM: CT ABDOMEN AND PELVIS WITH CONTRAST TECHNIQUE: Multidetector CT imaging of the abdomen and pelvis was performed using the standard protocol following bolus administration of intravenous contrast. CONTRAST:  159mL ISOVUE-300 IOPAMIDOL (ISOVUE-300) INJECTION 61% COMPARISON:  06/13/2016 FINDINGS: Lower chest: Bilateral lung base airspace opacities, most evident in the lower lobes. This may all be atelectasis. Consider pneumonia in the proper clinical setting. Hepatobiliary: There is intra and extrahepatic bile duct dilation. Common bile duct measures a maximum of 13 mm. Duct is dilated into the pancreatic head, where it intersects a medial duodenal diverticulum. There is milder intra hepatic bile duct dilation. Biliary dilation is similar to the prior CT. 7 mm low-density lesion is noted near the dome of the right lobe, stable consistent with a cyst. No other liver masses or lesions. Liver demonstrates diffusely decreased attenuation consistent with fatty infiltration. Gallbladder is significantly distended. There is no definitive wall thickening and no visualized stones. Pancreas: Mild dilation of  the pancreatic duct to a maximum of 3 mm similar to the prior CT. No pancreatic mass or inflammation. Spleen: Normal in size without focal abnormality. Adrenals/Urinary Tract: Mild adrenal gland thickening. No discrete masses. This is stable. Mild bilateral renal cortical thinning. No renal masses, stones or hydronephrosis. There is symmetric renal enhancement and excretion. Ureters are normal in course and in caliber. Bladder is unremarkable. Stomach/Bowel: Stomach is mild-to-moderately distended.  No wall thickening or convincing inflammation. Small bowel is normal in caliber. No wall thickening or inflammation. There are scattered colonic diverticula. No diverticulitis. No colonic wall thickening or other inflammatory process. An anastomosis staple line lies along the inferior right colon stable from the prior CT. Vascular/Lymphatic: No enlarged lymph nodes. Aortic atherosclerosis. No aneurysm. Reproductive: Uterus and bilateral adnexa are unremarkable. Other: There is a small amount of ascites, which is predominantly adjacent to the liver and distended gallbladder but also lies along the leaves of the mesentery. Musculoskeletal: No fracture or acute finding. No osteoblastic or osteolytic lesions. IMPRESSION: 1. Gallbladder is distended and there is a small amount of ascites that is centered in the right upper quadrant. Although there is no convincing gallbladder wall thickening and no visualized stone, the findings raise suspicion for acute cholecystitis. Recommend follow-up limited right upper quadrant ultrasound for further assessment. 2. There is intra and extrahepatic bile duct dilation. This is similar to the prior CT, and therefore presumed chronic. No visualized stone or obstructing mass. There is a medial duodenal diverticulum at the level of the ampullary. 3. Bilateral lung base opacities, mostly in the lower lobes, which may all reflect atelectasis. Consider pneumonia in the proper clinical setting. 4.  No other evidence an acute abnormality. No bowel obstruction or inflammation. Electronically Signed   By: Lajean Manes M.D.   On: 12/04/2017 19:49   US Abdomen Limited Ruq  Result Date: 11/25/2017 CLINICAL DATA:  Patient with abdominal pain beginning today. Distended gallbladder and ascites noted on CT. Patient with elevated liver enzymes and elevated lipase. EXAM: ULTRASOUND ABDOMEN LIMITED RIGHT UPPER QUADRANT COMPARISON:  Current abdomen and pelvis CT. FINDINGS: Gallbladder: The gallbladder is distended, but contains no stones. There is no sludge. No wall thickening. There is pericholecystic fluid, which is part of the ascites. Common bile duct: Diameter: 12 mm.  No duct stone visualized. Liver: Increased echogenicity consistent with hepatic steatosis as noted on CT. No mass or focal lesion. Small cyst at the dome of the right lobe not visualized sonographically. Portal vein is patent on color Doppler imaging with normal direction of blood flow towards the liver. Fluid noted adjacent to the liver mostly along its inferior margin, as well as adjacent to the gallbladder. IMPRESSION: 1. No sonographic findings acute cholecystitis. Gallbladder is distended, but there is no wall thickening. No gallstones. 2. Dilated common bile duct. This is presumed chronic since it is similar to previous CT scans. No duct stone visualized. 3. Ascites as noted on the current CT. 4. Hepatic steatosis. Electronically Signed   By: Lajean Manes M.D.   On: 11/27/2017 21:10    Procedures Procedures (including critical care time) CRITICAL CARE Performed by: Quintella Reichert   Total critical care time: 35 minutes  Critical care time was exclusive of separately billable procedures and treating other patients.  Critical care was necessary to treat or prevent imminent or life-threatening deterioration.  Critical care was time spent personally by me on the following activities: development of treatment plan with patient  and/or surrogate as well as nursing, discussions with consultants, evaluation of patient's response to treatment, examination of patient, obtaining history from patient or surrogate, ordering and performing treatments and interventions, ordering and review of laboratory studies, ordering and review of radiographic studies, pulse oximetry and re-evaluation of patient's condition.  Medications Ordered in ED Medications  iopamidol (ISOVUE-300) 61 % injection (has no administration in time range)  ondansetron (ZOFRAN) tablet 4 mg (has no administration in time range)  Or  ondansetron (ZOFRAN) injection 4 mg (has no administration in time range)  acetaminophen (TYLENOL) tablet 650 mg (650 mg Oral Given 11/26/2017 2303)    Or  acetaminophen (TYLENOL) suppository 650 mg ( Rectal See Alternative 12/16/2017 2303)  morphine 2 MG/ML injection 2 mg (2 mg Intravenous Given 12/16/2017 2337)  0.45 % sodium chloride infusion ( Intravenous New Bag/Given 12/04/2017 2337)  ondansetron (ZOFRAN) injection 4 mg (4 mg Intravenous Given 12/02/2017 1845)  sodium chloride 0.9 % bolus 1,000 mL (0 mLs Intravenous Stopped 11/25/2017 2044)  iopamidol (ISOVUE-300) 61 % injection 100 mL (100 mLs Intravenous Contrast Given 11/23/2017 1918)  piperacillin-tazobactam (ZOSYN) IVPB 3.375 g (0 g Intravenous Stopped 11/25/2017 2208)     Initial Impression / Assessment and Plan / ED Course  I have reviewed the triage vital signs and the nursing notes.  Pertinent labs & imaging results that were available during my care of the patient were reviewed by me and considered in my medical decision making (see chart for details).    Patient here for evaluation of abrupt onset abdominal pain. Patient ill appearing and diaphoretic on initial ED evaluation. Following IV fluid hydration, pain medications patient appears slightly improved. Labs are consistent with acute pancreatitis. CT abdomen demonstrates enlarged gallbladder. She was treated with  antibiotics for possible cholecystitis. Ultrasound is negative for cholecystitis. Medicine consulted for admission for further treatment. Patient updated findings of studies recommendation for admission and she is in agreement with treatment plan. Final Clinical Impressions(s) / ED Diagnoses   Final diagnoses:  Abdominal pain    ED Discharge Orders    None       Quintella Reichert, MD 11/28/2017 2351

## 2017-12-18 ENCOUNTER — Inpatient Hospital Stay (HOSPITAL_COMMUNITY): Payer: Medicare Other

## 2017-12-18 ENCOUNTER — Inpatient Hospital Stay (HOSPITAL_COMMUNITY): Payer: Medicare Other | Admitting: Certified Registered"

## 2017-12-18 DIAGNOSIS — A419 Sepsis, unspecified organism: Principal | ICD-10-CM

## 2017-12-18 DIAGNOSIS — R579 Shock, unspecified: Secondary | ICD-10-CM

## 2017-12-18 DIAGNOSIS — J189 Pneumonia, unspecified organism: Secondary | ICD-10-CM | POA: Clinically undetermined

## 2017-12-18 DIAGNOSIS — R6521 Severe sepsis with septic shock: Secondary | ICD-10-CM

## 2017-12-18 DIAGNOSIS — K838 Other specified diseases of biliary tract: Secondary | ICD-10-CM

## 2017-12-18 DIAGNOSIS — K859 Acute pancreatitis without necrosis or infection, unspecified: Secondary | ICD-10-CM

## 2017-12-18 DIAGNOSIS — R6520 Severe sepsis without septic shock: Secondary | ICD-10-CM

## 2017-12-18 DIAGNOSIS — R945 Abnormal results of liver function studies: Secondary | ICD-10-CM

## 2017-12-18 DIAGNOSIS — J9601 Acute respiratory failure with hypoxia: Secondary | ICD-10-CM

## 2017-12-18 DIAGNOSIS — K851 Biliary acute pancreatitis without necrosis or infection: Secondary | ICD-10-CM

## 2017-12-18 LAB — URINALYSIS, ROUTINE W REFLEX MICROSCOPIC
Bacteria, UA: NONE SEEN
Bilirubin Urine: NEGATIVE
Glucose, UA: NEGATIVE mg/dL
KETONES UR: 5 mg/dL — AB
Leukocytes, UA: NEGATIVE
Nitrite: NEGATIVE
Protein, ur: 100 mg/dL — AB
Specific Gravity, Urine: >1.046 — ABNORMAL HIGH (ref 1.005–1.030)
pH: 5 (ref 5.0–8.0)

## 2017-12-18 LAB — CBC WITH DIFFERENTIAL/PLATELET
ABS IMMATURE GRANULOCYTES: 0.01 10*3/uL (ref 0.00–0.07)
BASOS PCT: 0 %
Basophils Absolute: 0 10*3/uL (ref 0.0–0.1)
Eosinophils Absolute: 0.1 10*3/uL (ref 0.0–0.5)
Eosinophils Relative: 4 %
HCT: 50.1 % — ABNORMAL HIGH (ref 36.0–46.0)
HEMOGLOBIN: 14.4 g/dL (ref 12.0–15.0)
Immature Granulocytes: 0 %
Lymphocytes Relative: 11 %
Lymphs Abs: 0.2 10*3/uL — ABNORMAL LOW (ref 0.7–4.0)
MCH: 27.6 pg (ref 26.0–34.0)
MCHC: 28.7 g/dL — ABNORMAL LOW (ref 30.0–36.0)
MCV: 96.2 fL (ref 80.0–100.0)
Monocytes Absolute: 0.1 10*3/uL (ref 0.1–1.0)
Monocytes Relative: 5 %
Neutro Abs: 1.8 10*3/uL (ref 1.7–7.7)
Neutrophils Relative %: 80 %
PLATELETS: 198 10*3/uL (ref 150–400)
RBC: 5.21 MIL/uL — ABNORMAL HIGH (ref 3.87–5.11)
RDW: 14.6 % (ref 11.5–15.5)
WBC: 2.3 10*3/uL — ABNORMAL LOW (ref 4.0–10.5)
nRBC: 0 % (ref 0.0–0.2)

## 2017-12-18 LAB — BLOOD GAS, ARTERIAL
Acid-base deficit: 12.1 mmol/L — ABNORMAL HIGH (ref 0.0–2.0)
Acid-base deficit: 17.5 mmol/L — ABNORMAL HIGH (ref 0.0–2.0)
Acid-base deficit: 19.8 mmol/L — ABNORMAL HIGH (ref 0.0–2.0)
Acid-base deficit: 23.1 mmol/L — ABNORMAL HIGH (ref 0.0–2.0)
BICARBONATE: 6.1 mmol/L — AB (ref 20.0–28.0)
Bicarbonate: 12.4 mmol/L — ABNORMAL LOW (ref 20.0–28.0)
Bicarbonate: 9.8 mmol/L — ABNORMAL LOW (ref 20.0–28.0)
Bicarbonate: 9.8 mmol/L — ABNORMAL LOW (ref 20.0–28.0)
Delivery systems: POSITIVE
Delivery systems: POSITIVE
Drawn by: 257881
Drawn by: 308601
Drawn by: 308601
Drawn by: 308601
Expiratory PAP: 5
Expiratory PAP: 5
FIO2: 40
FIO2: 60
FIO2: 60
FIO2: 60
Inspiratory PAP: 10
Inspiratory PAP: 14
LHR: 26 {breaths}/min
MECHVT: 440 mL
MECHVT: 480 mL
Mode: POSITIVE
Mode: POSITIVE
O2 Saturation: 92.9 %
O2 Saturation: 94.5 %
O2 Saturation: 90.9 %
O2 Saturation: 92.1 %
PCO2 ART: 25.3 mmHg — AB (ref 32.0–48.0)
PEEP/CPAP: 5 cmH2O
PEEP/CPAP: 5 cmH2O
PH ART: 7.037 — AB (ref 7.350–7.450)
Patient temperature: 37
Patient temperature: 37
Patient temperature: 37
Patient temperature: 37.6
RATE: 30 resp/min
pCO2 arterial: 27.8 mmHg — ABNORMAL LOW (ref 32.0–48.0)
pCO2 arterial: 38.4 mmHg (ref 32.0–48.0)
pCO2 arterial: 24.3 mmHg — ABNORMAL LOW (ref 32.0–48.0)
pH, Arterial: 7.312 — ABNORMAL LOW (ref 7.350–7.450)
pH, Arterial: 7.17 — CL (ref 7.350–7.450)
pH, Arterial: 7.037 — CL (ref 7.350–7.450)
pO2, Arterial: 72.9 mmHg — ABNORMAL LOW (ref 83.0–108.0)
pO2, Arterial: 93.3 mmHg (ref 83.0–108.0)
pO2, Arterial: 86.6 mmHg (ref 83.0–108.0)
pO2, Arterial: 89.5 mmHg (ref 83.0–108.0)

## 2017-12-18 LAB — BASIC METABOLIC PANEL
Anion gap: 17 — ABNORMAL HIGH (ref 5–15)
Anion gap: 15 (ref 5–15)
BUN: 21 mg/dL (ref 8–23)
BUN: 27 mg/dL — AB (ref 8–23)
CHLORIDE: 109 mmol/L (ref 98–111)
CO2: 17 mmol/L — ABNORMAL LOW (ref 22–32)
CO2: 15 mmol/L — ABNORMAL LOW (ref 22–32)
CREATININE: 2.32 mg/dL — AB (ref 0.44–1.00)
Calcium: 7.4 mg/dL — ABNORMAL LOW (ref 8.9–10.3)
Calcium: 6.5 mg/dL — ABNORMAL LOW (ref 8.9–10.3)
Chloride: 105 mmol/L (ref 98–111)
Creatinine, Ser: 2.04 mg/dL — ABNORMAL HIGH (ref 0.44–1.00)
GFR calc Af Amer: 25 mL/min — ABNORMAL LOW (ref >60)
GFR calc Af Amer: 21 mL/min — ABNORMAL LOW (ref >60)
GFR calc non Af Amer: 21 mL/min — ABNORMAL LOW (ref >60)
GFR calc non Af Amer: 18 mL/min — ABNORMAL LOW (ref >60)
Glucose, Bld: 133 mg/dL — ABNORMAL HIGH (ref 70–99)
Glucose, Bld: 136 mg/dL — ABNORMAL HIGH (ref 70–99)
POTASSIUM: 4.8 mmol/L (ref 3.5–5.1)
Potassium: 6.1 mmol/L — ABNORMAL HIGH (ref 3.5–5.1)
SODIUM: 139 mmol/L (ref 135–145)
Sodium: 139 mmol/L (ref 135–145)

## 2017-12-18 LAB — APTT
aPTT: 30 seconds (ref 24–36)
aPTT: 41 seconds — ABNORMAL HIGH (ref 24–36)

## 2017-12-18 LAB — COMPREHENSIVE METABOLIC PANEL
ALT: 66 U/L — ABNORMAL HIGH (ref 0–44)
AST: 90 U/L — ABNORMAL HIGH (ref 15–41)
Albumin: 2.7 g/dL — ABNORMAL LOW (ref 3.5–5.0)
Alkaline Phosphatase: 19 U/L — ABNORMAL LOW (ref 38–126)
Anion gap: 12 (ref 5–15)
BUN: 31 mg/dL — ABNORMAL HIGH (ref 8–23)
CALCIUM: 5.9 mg/dL — AB (ref 8.9–10.3)
CO2: 12 mmol/L — ABNORMAL LOW (ref 22–32)
Chloride: 112 mmol/L — ABNORMAL HIGH (ref 98–111)
Creatinine, Ser: 2.53 mg/dL — ABNORMAL HIGH (ref 0.44–1.00)
GFR calc Af Amer: 19 mL/min — ABNORMAL LOW (ref >60)
GFR calc non Af Amer: 16 mL/min — ABNORMAL LOW (ref >60)
GLUCOSE: 148 mg/dL — AB (ref 70–99)
Potassium: 5.3 mmol/L — ABNORMAL HIGH (ref 3.5–5.1)
Sodium: 136 mmol/L (ref 135–145)
Total Bilirubin: 0.8 mg/dL (ref 0.3–1.2)
Total Protein: 4.8 g/dL — ABNORMAL LOW (ref 6.5–8.1)

## 2017-12-18 LAB — HEPATIC FUNCTION PANEL
ALT: 113 U/L — ABNORMAL HIGH (ref 0–44)
AST: 166 U/L — ABNORMAL HIGH (ref 15–41)
Albumin: 3.3 g/dL — ABNORMAL LOW (ref 3.5–5.0)
Alkaline Phosphatase: 31 U/L — ABNORMAL LOW (ref 38–126)
BILIRUBIN INDIRECT: 0.6 mg/dL (ref 0.3–0.9)
Bilirubin, Direct: 0.7 mg/dL — ABNORMAL HIGH (ref 0.0–0.2)
TOTAL PROTEIN: 6.4 g/dL — AB (ref 6.5–8.1)
Total Bilirubin: 1.3 mg/dL — ABNORMAL HIGH (ref 0.3–1.2)

## 2017-12-18 LAB — RENAL FUNCTION PANEL
Albumin: 2.5 g/dL — ABNORMAL LOW (ref 3.5–5.0)
BUN: 29 mg/dL — ABNORMAL HIGH (ref 8–23)
CALCIUM: 5.2 mg/dL — AB (ref 8.9–10.3)
CO2: <7 mmol/L — ABNORMAL LOW (ref 22–32)
Chloride: 114 mmol/L — ABNORMAL HIGH (ref 98–111)
Creatinine, Ser: 2.58 mg/dL — ABNORMAL HIGH (ref 0.44–1.00)
GFR calc Af Amer: 19 mL/min — ABNORMAL LOW (ref >60)
GFR calc non Af Amer: 16 mL/min — ABNORMAL LOW (ref >60)
Glucose, Bld: 164 mg/dL — ABNORMAL HIGH (ref 70–99)
Phosphorus: 5.7 mg/dL — ABNORMAL HIGH (ref 2.5–4.6)
Potassium: 5.6 mmol/L — ABNORMAL HIGH (ref 3.5–5.1)
Sodium: 139 mmol/L (ref 135–145)

## 2017-12-18 LAB — PROTIME-INR
INR: 1.17
INR: 1.53
Prothrombin Time: 14.8 seconds (ref 11.4–15.2)
Prothrombin Time: 18.2 seconds — ABNORMAL HIGH (ref 11.4–15.2)

## 2017-12-18 LAB — BRAIN NATRIURETIC PEPTIDE: B Natriuretic Peptide: 1071.7 pg/mL — ABNORMAL HIGH (ref 0.0–100.0)

## 2017-12-18 LAB — TROPONIN I
Troponin I: 0.03 ng/mL (ref <0.03)
Troponin I: 0.05 ng/mL (ref <0.03)

## 2017-12-18 LAB — LIPASE, BLOOD: Lipase: 2513 U/L — ABNORMAL HIGH (ref 11–51)

## 2017-12-18 LAB — CBC
HCT: 40.6 % (ref 36.0–46.0)
HCT: 40.3 % (ref 36.0–46.0)
Hemoglobin: 12 g/dL (ref 12.0–15.0)
Hemoglobin: 11.5 g/dL — ABNORMAL LOW (ref 12.0–15.0)
MCH: 28 pg (ref 26.0–34.0)
MCH: 27.4 pg (ref 26.0–34.0)
MCHC: 29.6 g/dL — ABNORMAL LOW (ref 30.0–36.0)
MCHC: 28.5 g/dL — ABNORMAL LOW (ref 30.0–36.0)
MCV: 94.9 fL (ref 80.0–100.0)
MCV: 96 fL (ref 80.0–100.0)
Platelets: 153 10*3/uL (ref 150–400)
Platelets: 163 10*3/uL (ref 150–400)
RBC: 4.28 MIL/uL (ref 3.87–5.11)
RBC: 4.2 MIL/uL (ref 3.87–5.11)
RDW: 14.8 % (ref 11.5–15.5)
RDW: 15.1 % (ref 11.5–15.5)
WBC: 2.1 10*3/uL — ABNORMAL LOW (ref 4.0–10.5)
WBC: 2.3 10*3/uL — ABNORMAL LOW (ref 4.0–10.5)
nRBC: 0 % (ref 0.0–0.2)
nRBC: 0 % (ref 0.0–0.2)

## 2017-12-18 LAB — AMYLASE: Amylase: 2241 U/L — ABNORMAL HIGH (ref 28–100)

## 2017-12-18 LAB — LACTIC ACID, PLASMA
Lactic Acid, Venous: 7.2 mmol/L (ref 0.5–1.9)
Lactic Acid, Venous: 5.5 mmol/L (ref 0.5–1.9)
Lactic Acid, Venous: 5.7 mmol/L (ref 0.5–1.9)
Lactic Acid, Venous: 10.9 mmol/L (ref 0.5–1.9)

## 2017-12-18 LAB — TRIGLYCERIDES: Triglycerides: 134 mg/dL (ref <150)

## 2017-12-18 LAB — C-REACTIVE PROTEIN: CRP: 15.4 mg/dL — ABNORMAL HIGH (ref <1.0)

## 2017-12-18 LAB — MRSA PCR SCREENING: MRSA by PCR: NEGATIVE

## 2017-12-18 MED ORDER — HEPARIN SODIUM (PORCINE) 5000 UNIT/ML IJ SOLN: 5000.0000 [IU] | Freq: Two times a day (BID) | INTRAMUSCULAR | Status: DC

## 2017-12-18 MED ORDER — FAMOTIDINE IN NACL 20-0.9 MG/50ML-% IV SOLN: 20.0000 mg | INTRAVENOUS | Status: DC

## 2017-12-18 MED ORDER — MIDAZOLAM HCL 2 MG/2ML IJ SOLN: 1.0000 mg | INTRAMUSCULAR | Status: DC | PRN

## 2017-12-18 MED ORDER — ORAL CARE MOUTH RINSE
15.0000 mL | OROMUCOSAL | Status: DC
  Administered 2017-12-18: 15 mL via OROMUCOSAL

## 2017-12-18 MED ORDER — NOREPINEPHRINE 4 MG/250ML-% IV SOLN
0.0000 ug/min | INTRAVENOUS | Status: DC
  Administered 2017-12-18 (×2): 2 ug/min via INTRAVENOUS
  Administered 2017-12-18: 21 ug/min via INTRAVENOUS
  Administered 2017-12-19: 40 ug/min via INTRAVENOUS
  Filled 2017-12-18 (×7): qty 250

## 2017-12-18 MED ORDER — SODIUM BICARBONATE 8.4 % IV SOLN
100.0000 meq | Freq: Once | INTRAVENOUS | Status: AC
  Administered 2017-12-18: 100 meq via INTRAVENOUS
  Filled 2017-12-18: qty 50

## 2017-12-18 MED ORDER — VASOPRESSIN 20 UNIT/ML IV SOLN
0.0400 [IU]/min | INTRAVENOUS | Status: DC
  Administered 2017-12-18: 0.04 [IU]/min via INTRAVENOUS
  Filled 2017-12-18: qty 2

## 2017-12-18 MED ORDER — LACTATED RINGERS IV BOLUS
1000.0000 mL | Freq: Once | INTRAVENOUS | Status: AC
  Administered 2017-12-18: 1000 mL via INTRAVENOUS

## 2017-12-18 MED ORDER — SODIUM CHLORIDE 0.9 % IV SOLN: 250.0000 mL | INTRAVENOUS | Status: DC

## 2017-12-18 MED ORDER — SODIUM BICARBONATE 8.4 % IV SOLN
INTRAVENOUS | Status: DC
  Administered 2017-12-18: 22:00:00 via INTRAVENOUS
  Filled 2017-12-18 (×2): qty 150

## 2017-12-18 MED ORDER — FAMOTIDINE IN NACL 20-0.9 MG/50ML-% IV SOLN
20.0000 mg | Freq: Two times a day (BID) | INTRAVENOUS | Status: DC
  Administered 2017-12-18: 20 mg via INTRAVENOUS
  Filled 2017-12-18 (×3): qty 50

## 2017-12-18 MED ORDER — MIDAZOLAM HCL 2 MG/2ML IJ SOLN
1.0000 mg | INTRAMUSCULAR | Status: DC | PRN
  Administered 2017-12-18: 1 mg via INTRAVENOUS
  Filled 2017-12-18: qty 2

## 2017-12-18 MED ORDER — DOCUSATE SODIUM 50 MG/5ML PO LIQD: 100.0000 mg | Freq: Two times a day (BID) | ORAL | Status: DC | PRN

## 2017-12-18 MED ORDER — ALBUTEROL SULFATE (2.5 MG/3ML) 0.083% IN NEBU: 2.5000 mg | INHALATION_SOLUTION | RESPIRATORY_TRACT | Status: DC | PRN

## 2017-12-18 MED ORDER — FENTANYL CITRATE (PF) 100 MCG/2ML IJ SOLN
50.0000 ug | Freq: Once | INTRAMUSCULAR | Status: AC
  Administered 2017-12-18: 50 ug via INTRAVENOUS

## 2017-12-18 MED ORDER — FENTANYL BOLUS VIA INFUSION
25.0000 ug | INTRAVENOUS | Status: DC | PRN
  Filled 2017-12-18: qty 25

## 2017-12-18 MED ORDER — FENTANYL CITRATE (PF) 100 MCG/2ML IJ SOLN
INTRAMUSCULAR | Status: AC
  Administered 2017-12-18: 50 ug via INTRAVENOUS
  Filled 2017-12-18: qty 2

## 2017-12-18 MED ORDER — PIPERACILLIN-TAZOBACTAM 3.375 G IVPB
3.3750 g | Freq: Three times a day (TID) | INTRAVENOUS | Status: DC
  Administered 2017-12-18 (×2): 3.375 g via INTRAVENOUS
  Filled 2017-12-18 (×2): qty 50

## 2017-12-18 MED ORDER — CALCIUM GLUCONATE-NACL 1-0.675 GM/50ML-% IV SOLN
1.0000 g | Freq: Once | INTRAVENOUS | Status: AC
  Administered 2017-12-18: 1000 mg via INTRAVENOUS
  Filled 2017-12-18: qty 50

## 2017-12-18 MED ORDER — MIDAZOLAM HCL 2 MG/2ML IJ SOLN
INTRAMUSCULAR | Status: AC
  Administered 2017-12-18: 1 mg via INTRAVENOUS
  Filled 2017-12-18: qty 2

## 2017-12-18 MED ORDER — VANCOMYCIN HCL 10 G IV SOLR
1500.0000 mg | Freq: Once | INTRAVENOUS | Status: AC
  Administered 2017-12-18: 1500 mg via INTRAVENOUS
  Filled 2017-12-18: qty 1500

## 2017-12-18 MED ORDER — CHLORHEXIDINE GLUCONATE 0.12% ORAL RINSE (MEDLINE KIT)
15.0000 mL | Freq: Two times a day (BID) | OROMUCOSAL | Status: DC
  Administered 2017-12-18: 15 mL via OROMUCOSAL

## 2017-12-18 MED ORDER — LORAZEPAM 2 MG/ML IJ SOLN: 0.5000 mg | INTRAMUSCULAR | Status: DC | PRN

## 2017-12-18 MED ORDER — ALBUMIN HUMAN 5 % IV SOLN
25.0000 g | Freq: Once | INTRAVENOUS | Status: AC
  Administered 2017-12-18: 25 g via INTRAVENOUS
  Filled 2017-12-18: qty 500

## 2017-12-18 MED ORDER — CHLORHEXIDINE GLUCONATE CLOTH 2 % EX PADS: 6.0000 | MEDICATED_PAD | Freq: Every day | CUTANEOUS | Status: DC

## 2017-12-18 MED ORDER — FENTANYL 2500MCG IN NS 250ML (10MCG/ML) PREMIX INFUSION
25.0000 ug/h | INTRAVENOUS | Status: DC
  Administered 2017-12-18: 50 ug/h via INTRAVENOUS
  Filled 2017-12-18: qty 250

## 2017-12-18 MED ORDER — SODIUM CHLORIDE 0.9 % IV BOLUS
2000.0000 mL | Freq: Once | INTRAVENOUS | Status: AC
  Administered 2017-12-18: 2000 mL via INTRAVENOUS

## 2017-12-18 MED ORDER — SODIUM CHLORIDE 0.9 % IV BOLUS
500.0000 mL | Freq: Once | INTRAVENOUS | Status: AC
  Administered 2017-12-18: 500 mL via INTRAVENOUS

## 2017-12-18 MED ORDER — ENOXAPARIN SODIUM 30 MG/0.3ML ~~LOC~~ SOLN
30.0000 mg | SUBCUTANEOUS | Status: DC
  Administered 2017-12-18: 30 mg via SUBCUTANEOUS
  Filled 2017-12-18: qty 0.3

## 2017-12-18 MED ORDER — ALBUMIN HUMAN 25 % IV SOLN
25.0000 g | Freq: Once | INTRAVENOUS | Status: AC
  Administered 2017-12-18: 25 g via INTRAVENOUS
  Filled 2017-12-18: qty 100

## 2017-12-18 MED ORDER — PIPERACILLIN-TAZOBACTAM IN DEX 2-0.25 GM/50ML IV SOLN
2.2500 g | Freq: Three times a day (TID) | INTRAVENOUS | Status: DC
  Administered 2017-12-19: 2.25 g via INTRAVENOUS
  Filled 2017-12-18 (×2): qty 50

## 2017-12-18 MED ORDER — ACETAMINOPHEN 10 MG/ML IV SOLN
1000.0000 mg | Freq: Four times a day (QID) | INTRAVENOUS | Status: DC | PRN
  Filled 2017-12-18: qty 100

## 2017-12-18 MED ORDER — SODIUM CHLORIDE 0.9 % IV SOLN
1000.0000 mL | Freq: Once | INTRAVENOUS | Status: AC
  Administered 2017-12-18: 1000 mL via INTRAVENOUS

## 2017-12-18 MED ORDER — ORAL CARE MOUTH RINSE: 15.0000 mL | Freq: Two times a day (BID) | OROMUCOSAL | Status: DC

## 2017-12-18 MED ORDER — KETOROLAC TROMETHAMINE 15 MG/ML IJ SOLN: 7.5000 mg | Freq: Four times a day (QID) | INTRAMUSCULAR | Status: DC | PRN

## 2017-12-18 MED ORDER — SIMETHICONE 80 MG PO CHEW
80.0000 mg | CHEWABLE_TABLET | Freq: Four times a day (QID) | ORAL | Status: DC | PRN
  Administered 2017-12-18: 80 mg via ORAL
  Filled 2017-12-18: qty 1

## 2017-12-18 MED ORDER — CHLORHEXIDINE GLUCONATE 0.12 % MT SOLN
15.0000 mL | Freq: Two times a day (BID) | OROMUCOSAL | Status: DC
  Administered 2017-12-18 (×2): 15 mL via OROMUCOSAL
  Filled 2017-12-18: qty 15

## 2017-12-18 MED ORDER — KETOROLAC TROMETHAMINE 15 MG/ML IJ SOLN
15.0000 mg | Freq: Once | INTRAMUSCULAR | Status: AC
  Administered 2017-12-18: 15 mg via INTRAVENOUS
  Filled 2017-12-18: qty 1

## 2017-12-18 MED ORDER — SODIUM BICARBONATE 8.4 % IV SOLN
INTRAVENOUS | Status: DC
  Administered 2017-12-18: 21:00:00 via INTRAVENOUS
  Filled 2017-12-18: qty 150

## 2017-12-18 MED ORDER — SODIUM BICARBONATE 8.4 % IV SOLN
INTRAVENOUS | Status: AC
  Administered 2017-12-18: 100 meq via INTRAVENOUS
  Filled 2017-12-18: qty 50

## 2017-12-18 MED ORDER — SODIUM CHLORIDE 0.9 % IV SOLN
INTRAVENOUS | Status: DC
  Administered 2017-12-18 (×2): via INTRAVENOUS

## 2017-12-18 MED ORDER — FENTANYL CITRATE (PF) 100 MCG/2ML IJ SOLN: 6.2500 ug | INTRAMUSCULAR | Status: DC | PRN

## 2017-12-18 MED ORDER — FENTANYL BOLUS VIA INFUSION
25.0000 ug | INTRAVENOUS | Status: DC | PRN
  Administered 2017-12-18: 25 ug via INTRAVENOUS
  Filled 2017-12-18: qty 25

## 2017-12-18 MED ORDER — SODIUM CHLORIDE 0.9 % IV BOLUS (SEPSIS)
1000.0000 mL | Freq: Once | INTRAVENOUS | Status: AC
  Administered 2017-12-18: 1000 mL via INTRAVENOUS

## 2017-12-18 MED ORDER — SODIUM CHLORIDE 0.9 % IV BOLUS
1000.0000 mL | Freq: Once | INTRAVENOUS | Status: AC
  Administered 2017-12-18: 1000 mL via INTRAVENOUS

## 2017-12-18 NOTE — Progress Notes (Signed)
CRITICAL VALUE ALERT  Critical Value:  Lactic 5.5 Troponin 0.03  Date & Time Notied:  12/18/17 12:25  Provider Notified: Dr. Ander Slade  Orders Received/Actions taken: MD aware

## 2017-12-18 NOTE — Progress Notes (Signed)
RN called and stated that the Pt's RR in the 40's. RT removed the BIPAP and placed the Pt on 3L  and put warm blanket on the Pt to see if this would help her relax. 0752 the nurse called again and the Pt's RR was in the 50's. RT placed the Pt back on BIPAP. RT will continue to monitor

## 2017-12-18 NOTE — Procedures (Signed)
Central Venous Catheter Insertion Procedure Note [name] [name] [id number] 03-13-1928  Procedure: Insertion of Central Venous Catheter Indications: Drug and/or fluid administration and Frequent blood sampling  Procedure Details Consent: Risks of procedure as well as the alternatives and risks of each were explained to the (patient/caregiver).  Consent for procedure obtained. Time Out: Verified patient identification, verified procedure, site/side was marked, verified correct patient position, special equipment/implants available, medications/allergies/relevent history reviewed, required imaging and test results available.  Performed  Maximum sterile technique was used including antiseptics, cap, gloves, gown, hand hygiene and mask. Skin prep: Chlorhexidine; local anesthetic administered [name] antimicrobial bonded/coated triple lumen catheter was placed in the right internal jugular vein using the Seldinger technique.  Evaluation Blood flow good Complications: No apparent complications Patient did tolerate procedure well. Chest X-ray ordered to verify placement.  CXR: pending.  [name] [name] [name] [name] 12/18/2017, 10:02 AM

## 2017-12-18 NOTE — Progress Notes (Signed)
eLink Physician-Brief Progress Note Patient Name: [name] [name] DOB: [birth year] MRN: [id number]   Date of Service  12/18/2017  HPI/Events of Note  Septic shock from acute pancreatitis, acute respiratory failure, profound metabolic acidosis, acute kidney injury  eICU Interventions  Normal saline 3 liters iv bolus, 5 % albumin 1500 ml iv fluid bolus, Patient intubated, NaHco3 2 amps iv bolus, NaHco3 infusion at 150 ml/hr, Vascath placed by Dr. Claudie Leach for CRRT, Micafungin added to [place], family updated regarding critical, life threatening nature of her illness. Son requested DNR status. Order placed.        Kerry Kass Elfreida Heggs 12/18/2017, 9:23 PM

## 2017-12-18 NOTE — Progress Notes (Signed)
PHARMACY NOTE -  ANTIBIOTIC RENAL DOSE ADJUSTMENT   Patient is currently on Zosyn for pancreatitis and Pepcid for reflux/SUP. SCr increasing , estimated CrCl ~18 ml/min  Medications have been renally dose adjusted: -Zosyn 2.25gm IV 8h (infused over 30 min) -Pepcid 20mg  IV Q24h (home dose)  Will continue to monitor for subsequent changes in renal function and adjust medication doses as needed.   Netta Cedars, PharmD, BCPS 12/18/2017@1 :41 PM

## 2017-12-18 NOTE — Progress Notes (Signed)
2 mg of Morphine PRN order. Unsure if patient could tolerate full 2 mg dose.  1 mg Morphine given 1 mg wasted instead of giving 2 mg of Morphine   Patient tolerated 1 mg of Morphine fine.   Remaining 1 mg of morphine given to equal 2 mg Morphine dose.    Wasted both 1 mgs of Morphine with Leonie Man RN.

## 2017-12-18 NOTE — Consult Note (Addendum)
Gastroenterology Inpatient Consultation   Attending Requesting Consult No att. providers found  [facility] Day: 3  Reason for Consult  pancreatitis presumed gallstone related   History of Present Illness  [name] [name] is a 84 y.o. female with a pmh significant for Schatzki Ring, hx of SBO, HH, HTN, Anxiety, Chronically dilated CBD with GB still in place.  The GI service is consulted for evaluation and management of severe  Pancreatitis felt gallstone related.  The GI service is asked to evaluate this patient after transfer from the floor to the intensive care unit.  On 11/29 the patient presented to the hospital with severe abdominal pain with associated nausea and vomiting.  She was placed on the floor and then transferred to the ICU in the setting of increasing hypotension.  Imaging suggested a dilated CBD but this was chronically dilated going back to 2018 if not before.  She has a periampullary diverticulum noted on prior imaging.  The patient when evaluated this afternoon, 11/30, shows evidence of severe pancreatitis in the setting of creatinine having bumped, increasing tachypnea, increasing tachycardia.  She has received multiple units of normal saline as well as LR.  She is at 3 and 50 cc of urine output to this point in time.  She has a gallbladder that is distended but does not show overt cholecystitis.  Blood cultures are pending.  She is previously been seen by Dr. Sharlett Iles recently has been following with Dr. Benson Norway.  Patient is having significant abdominal pain and is on a fentanyl drip currently.  Pain is in the midepigastrium generalizing throughout radiating to her back.  Her lactate is elevated initially to 7 is come down to 5 with another pending this evening.  Triglycerides previously checked years ago were within normal limits.  GI Review of Systems Positive as above Negative for dysphagia, odynophagia, changes in bowel habits   Problem List   Patient Active  Problem List   Diagnosis Date Noted  . Acute respiratory failure with hypoxemia ([place]) 12/18/2017  . HCAP (healthcare-associated pneumonia) 12/18/2017  . Acute pancreatitis 12/10/2017  . Ileus ([place]) 06/14/2016  . Ileitis 06/13/2016  . Nausea and vomiting 06/13/2016  . Generalized abdominal pain 06/13/2016  . Generalized anxiety disorder 02/26/2016  . Aspiration pneumonia of right lower lobe ([place])   . Small bowel obstruction ([place])   . RLL pneumonia ([place]) 03/26/2015  . SBO (small bowel obstruction) ([place]) 03/24/2015  . S/P left TKA 11/13/2014  . S/P knee replacement 11/13/2014  . Overweight (BMI 25.0-29.9) 07/15/2012  . Expected blood loss anemia 07/15/2012  . S/P right TK revision 07/14/2012  . Ankylosis of knee joint 04/03/2011  . Fibrosis of knee joint 01/22/2011  . Chest pain 10/20/2010  . Bacterial overgrowth syndrome 10/09/2010  . Status post right hemicolectomy 10/09/2010  . Esophageal dysphagia 10/09/2010  . B12 deficiency 10/09/2010  . DYSPHAGIA UNSPECIFIED 01/23/2010  . BLIND LOOP SYNDROME 08/26/2009  . GASTRITIS 12/26/2008  . WEIGHT LOSS-ABNORMAL 12/25/2008  . DYSPHAGIA 12/25/2008  . DIARRHEA 07/17/2008  . HOARSENESS 07/26/2007  . B12 DEFICIENCY 07/25/2007  . Essential hypertension 07/25/2007  . MITRAL VALVE PROLAPSE 07/25/2007  . PREMATURE VENTRICULAR CONTRACTIONS 07/25/2007  . ESOPHAGITIS, REFLUX 07/25/2007  . Gastroesophageal reflux disease 07/25/2007  . GASTRITIS, CHRONIC 07/25/2007  . HIATAL HERNIA 07/25/2007  . ABSCESS OF INTESTINE 07/25/2007     Histories  Past Medical History Past Medical History:  Diagnosis Date  . Allergy    SEASONAL  . Anemia   .  Anxiety   . Arthritis    KNEES/HANDS  . 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 Blind loop syndrome   . Complication of anesthesia    pt can't remember exact complication, but she remembers about 30 years ago she may have had trouble waking up from anesthesia.  . Fatty liver   . GERD  (gastroesophageal reflux disease)   . Hiatal hernia   . Hypertension   . MVP (mitral valve prolapse)   . PVC's (premature ventricular contractions)   . Schatzki's ring   . Small bowel obstruction ([place])   . Vitamin B12 deficiency    Past Surgical History:  Procedure Laterality Date  . CATARACT EXTRACTION, BILATERAL  03-26-11  . HEMICOLECTOMY     and ileectomy Dr Vida Rigger; due to perforated viscus  . I&D KNEE WITH POLY EXCHANGE  04/03/2011   Procedure: IRRIGATION AND DEBRIDEMENT KNEE WITH POLY EXCHANGE;  Surgeon: Mcarthur Rossetti, MD;  Location: [facility];  Service: Orthopedics;  Laterality: Right;  . KNEE CLOSED REDUCTION  01/22/2011   Procedure: CLOSED MANIPULATION KNEE;  Surgeon: Mcarthur Rossetti;  Location: [facility];  Service: Orthopedics;  Laterality: Right;  Manipulation under anesthesia right knee  . Nuclear stress test  Sept 2012   Normal  . REPLACEMENT TOTAL KNEE     right  . ROTATOR CUFF REPAIR  03-26-11   right arm  . SYNOVECTOMY  04/03/2011   Procedure: SYNOVECTOMY;  Surgeon: Mcarthur Rossetti, MD;  Location: [facility];  Service: Orthopedics;  Laterality: Right;  . TOTAL KNEE ARTHROPLASTY Left 11/13/2014   Procedure: TOTAL KNEE ARTHROPLASTY;  Surgeon: Paralee Cancel, MD;  Location: [facility];  Service: Orthopedics;  Laterality: Left;  . TOTAL KNEE REVISION Right 07/14/2012   Procedure: RIGHT TOTAL KNEE REVISION;  Surgeon: Mauri Pole, MD;  Location: [facility];  Service: Orthopedics;  Laterality: Right;  . TUBAL LIGATION      Allergies No Known Allergies  Family History Family History  Problem Relation Age of Onset  . Hypertension Sister   . Diabetes Sister   . Diabetes Brother   . Coronary artery disease Brother   . Deep vein thrombosis Sister   . Colon cancer Neg Hx   . Colon polyps Neg Hx     Social History Social History   Socioeconomic History  . Marital status: Married    Spouse name: Not on file  . Number of children: 2  . Years of education: Not on file   . Highest education level: Not on file  Occupational History  . Occupation: Retired    Fish farm manager: RETIRED  Social Needs  . Financial resource strain: Not on file  . Food insecurity:    Worry: Not on file    Inability: Not on file  . Transportation needs:    Medical: Not on file    Non-medical: Not on file  Tobacco Use  . Smoking status: Never Smoker  . Smokeless tobacco: Never Used  Substance and Sexual Activity  . Alcohol use: No  . Drug use: No  . Sexual activity: Never  Lifestyle  . Physical activity:    Days per week: Not on file    Minutes per session: Not on file  . Stress: Not on file  Relationships  . Social connections:    Talks on phone: Not on file    Gets together: Not on file    Attends religious service: Not on file    Active member of club or  organization: Not on file    Attends meetings of clubs or organizations: Not on file    Relationship status: Not on file  . Intimate partner violence:    Fear of current or ex partner: Not on file    Emotionally abused: Not on file    Physically abused: Not on file    Forced sexual activity: Not on file  Other Topics Concern  . Not on file  Social History Narrative  . Not on file    Medications  Home Medications No current facility-administered medications on file prior to encounter.    Current Outpatient Medications on File Prior to Encounter  Medication Sig Dispense Refill  . acetaminophen (TYLENOL) 500 MG tablet Take 1,000 mg by mouth every 6 (six) hours as needed for moderate pain.    Marland Kitchen alprazolam (XANAX) 2 MG tablet TAKE 2 TABLETS AT BEDTIME AS NEEDED FOR SLEEP  0  . amLODipine (NORVASC) 10 MG tablet Take 10 mg by mouth every morning.   3  . calcium carbonate (TUMS - DOSED IN MG ELEMENTAL CALCIUM) 500 MG chewable tablet Chew 1 tablet by mouth daily as needed for indigestion or heartburn.    Marland Kitchen DEXILANT 60 MG capsule Take 60 mg by mouth daily.     . famotidine (PEPCID) 20 MG tablet Take 1 tablet (20 mg  total) by mouth daily. 30 tablet 0  . loratadine (CLARITIN) 10 MG tablet Take 10 mg by mouth daily as needed for allergies.     Marland Kitchen losartan (COZAAR) 25 MG tablet Take 25 mg by mouth every morning.  3  . potassium chloride (K-DUR) 10 MEQ tablet Take 1 tablet (10 mEq total) by mouth 2 (two) times daily. 6 tablet 0  . traMADol (ULTRAM) 50 MG tablet Take 50 mg by mouth every 6 (six) hours as needed for moderate pain or severe pain.     . Vitamin D, Ergocalciferol, (DRISDOL) 50000 UNITS CAPS Take 50,000 Units by mouth every 7 (seven) days. Friday    . ALPRAZolam (XANAX) 0.25 MG tablet Take 1 tablet (0.25 mg total) by mouth 4 (four) times daily as needed. ANXIETY/ SPASM (Patient not taking: Reported on 12/04/2017) 30 tablet 0  . polyethylene glycol (MIRALAX) packet Take 17 g by mouth daily as needed for mild constipation. (Patient not taking: Reported on 07/10/2017) 30 each 1   Scheduled Inpatient Medications  Continuous Inpatient Infusions  PRN Inpatient Medications    Review of Systems  General: Denies fevers/chills/weight loss/night sweats HEENT: Denies oral lesions/sore throat/headaches/visual changes Cardiovascular: Denies chest pain/palpitations Pulmonary: Denies shortness of breath/cough Gastroenterological: See HPI Genitourinary: Denies darkened urine or hematuria Hematological: Denies easy bruising/bleeding Endocrine: Denies temperature intolerance Dermatological: Denies skin changes Psychological: Mood is stable Allergy & Immunology: Denies severe allergic reactions Musculoskeletal: Denies new arthralgias   Physical Examination  BP (!) 76/36   Pulse 80   Temp (!) 96.3 F (35.7 C)   Resp (!) 30   Ht '5\' 5"'$  (1.651 m)   Wt 81.9 kg   SpO2 100%   BMI 30.05 kg/m  GEN: Ill-appearing with BiPAP currently on, family at bedside PSYCH: Cooperative, EYE: Conjunctivae pink, sclerae anicteric ENT: Dry mucous membranes, unable to evaluate the oropharynx due to BiPAP being on NECK:  Supple CV: Tachycardic with systolic murmurs present in the right upper sternal border and left upper sternal border RESP: BiPAP sounds present however rales noted at the bilateral bases GI: Hyperactive bowel sounds, soft, surgical incision scars present well-healed, severe tenderness to  palpation throughout the entire abdomen, volitional guarding present, no rebound MSK/EXT: Bilateral lower extremity edema present SKIN: No jaundice NEURO:  Alert & Oriented x 3   Review of Data  I reviewed the following data at the time of this encounter:  Laboratory Studies   Recent Labs  Lab 12/18/17 2200 07-Jan-2018 0235  NA 139 138  K 5.6* 5.7*  CL 114* 109  CO2 <7* <7*  BUN 29* 24*  CREATININE 2.58* 2.48*  GLUCOSE 164* 209*  CALCIUM 5.2* 5.1*  MG  --  1.4*  PHOS 5.7* 6.7*   Recent Labs  Lab 01-07-2018 0235  AST 198*  ALT 198*  ALKPHOS 12*    Recent Labs  Lab 12/18/17 1123 12/18/17 2000 01-07-2018 0235  WBC 2.1* 2.3* 1.9*  HGB 12.0 11.5* 6.5*  HCT 40.6 40.3 24.4*  PLT 153 163 77*   Recent Labs  Lab 12/18/17 0806  12/18/17 2000 07-Jan-2018 0235  APTT 30   < >  --  69*  INR 1.17  --  1.53  --    < > = values in this interval not displayed.   Imaging Studies  November 29 CT abdomen pelvis with contrast IMPRESSION: 1. Gallbladder is distended and there is a small amount of ascites that is centered in the right upper quadrant. Although there is no convincing gallbladder wall thickening and no visualized stone, the findings raise suspicion for acute cholecystitis. Recommend follow-up limited right upper quadrant ultrasound for further assessment. 2. There is intra and extrahepatic bile duct dilation. This is similar to the prior CT, and therefore presumed chronic. No visualized stone or obstructing mass. There is a medial duodenal diverticulum at the level of the ampullary. 3. Bilateral lung base opacities, mostly in the lower lobes, which may all reflect atelectasis. Consider  pneumonia in the proper clinical setting. 4. No other evidence an acute abnormality. No bowel obstruction or Inflammation.  November 29 right upper quadrant ultrasound IMPRESSION: 1. No sonographic findings acute cholecystitis. Gallbladder is distended, but there is no wall thickening. No gallstones. 2. Dilated common bile duct. This is presumed chronic since it is similar to previous CT scans. No duct stone visualized. 3. Ascites as noted on the current CT. 4. Hepatic steatosis.  May 2018 CT abdomen pelvis with contrast IMPRESSION: 1. Dilatation of small-bowel loops to 4.1 cm in maximal diameter, with gradual fecalization. Associated wall thickening at the mid to distal ileum, concerning for infectious or inflammatory ileitis, with underlying dysmotility. No evidence of bowel obstruction.  2. Dilatation of the common bile duct to 1.2 cm in maximal diameter, with mild prominence of the intrahepatic biliary ducts, raising concern for distal obstruction. This is grossly stable from 2017 and may reflect the patient's baseline. 3. Right basilar airspace opacity likely reflects atelectasis. 4. Scattered coronary artery calcifications seen. 5. 7 mm nonspecific hypodensity at the hepatic dome. 6. Scattered diverticulosis along the ascending, descending and sigmoid colon, without evidence of diverticulitis. 7. Scattered aortic atherosclerosis. 8. Mild degenerative change at the lower lumbar spine.  GI Procedures and Studies  No relevant studies to review   Assessment  Ms. Buechner is a 84 y.o. female with a pmh significant for Schatzki Ring, hx of SBO, HH, HTN, Anxiety, Chronically dilated CBD with GB still in place.  The GI service is consulted for evaluation and management of severe  Pancreatitis felt gallstone related.  This patient is not hemodynamically stable and not clinically stable.  She has severe acute pancreatitis based on the  development of multiorgan dysfunction.  She has had a  rising creatinine by more than double over the course of less than 24 hours of admission to the hospital.  She is also developing increasing cardiopulmonary compromise while being on BiPAP and increasing sinus tachycardia she is on broad-spectrum antibiotics which is reasonable.  She is received at least 3-1/2 L of fluid bolus normal saline as well as lactated ringer and is on 125 to 150 cc/h of lactated Ringer.  Her urine output however is significantly decreased and she is having had only 300 cc of urine output over the course of 12 hours.  I am very concerned about the development of her multiorgan dysfunction.  Etiology of her pancreatitis is likely is gallstone related to the setting is the known cholelithiasis.  She has chronically dilated CBD and her liver tests are cytologically.  At one point she would require an MRI/MRCP.  I discussed with the patient and with her family that she is severely ill and that we are hopeful that her profound dehydration of her body even though she is being given fluids and pressor support will hopefully reversing the course of the coming hours to days.  She has a high risk of mortality based on multiple organ dysfunction occurring with a 24 hours of her inpatient admission..  No plan for ERCP currently.  Eventually will need surgery for attempt at cholecystectomy.  For now we will hold on enteral nutrition via core track or NG/NJ tube feeding until we see how she stabilized from a respiratory perspective.  Would add on vancomycin for broad-spectrum antibiotics as she is already on Zosyn.  I discussed these recommendations with the ICU attending.  All patient and family questions were answered, to the best of my ability, and the patient agrees to the aforementioned plan of action with follow-up as indicated.   Plan/Recommendations  Send ESR/CRP on day 1 & day 3 iCalcium Level Lipid Panel Daily LFTs IVF Resuscitation & Maintenance with Lactated Ringers (maintain adequate  UOP) - as below Early Enteral Nutrition has been shown to decrease infectious/surgical complications may consider as early as tomorrow if she becomes intubated Endoscopic Evaluation via ERCP will not be performed at this time unless worsening LFTs and concern for ascending cholangitis increases and/or MRI/MRCP performed showing stone in CBD -Please continue lactated ringers at 3 cc/kg/hr for 8 hrs. Would evaluate at 8 hours if evidence of tachycardia, hypotension, or low urine output would bolus at 20cc/kg and continue LR at 3cc/kg/hr. If at 8 hours there is no tachycardia, hypotension and urine output is appropriate can decrease fluids to 1.5cc/kg/hr. -Pain control per the inpatient medical team. -If worsening severe pain would consider KUB and cross sectional imaging for further evaluation.   Thank you for this consult.  We will continue to follow.  Please page/call with questions or concerns.   Justice Britain, MD [facility] Office # [phone]

## 2017-12-18 NOTE — Progress Notes (Signed)
Pharmacy Antibiotic Note  [name] [name] is a 84 y.o. female admitted on 11/27/2017 with pancreatitis.  Pharmacy has been consulted for Vancomycin dosing.  Patient already on Zosyn  12/18/2017:  Low grade fevers on Zosyn- Tm 100.65F  WBC WNL  Scr trending UP- I/O (since admit): 4825/283ml  Plan:  Vancomycin 1500mg  IV loading dose x1.  Will check random levels and re-dose as needed  Zosyn 2.25gm IV q8h  Daily Scr  Monitor renal function and cx data   Height: 5\' 5"  (165.1 cm) Weight: 180 lb 8.9 oz (81.9 kg) IBW/kg (Calculated) : 57  Temp (24hrs), Avg:99.7 F (37.6 C), Min:97.3 F (36.3 C), Max:100.6 F (38.1 C)  Recent Labs  Lab 12/06/2017 1618 12/18/17 0532 12/18/17 1123  WBC 5.0 2.3* 2.1*  CREATININE 1.20* 2.04* 2.32*  LATICACIDVEN  --  7.2* 5.5*    Estimated Creatinine Clearance: 18.1 mL/min (A) (by C-G formula based on SCr of 2.32 mg/dL (H)).    No Known Allergies  Antimicrobials this admission: 11/29 Zosyn >>  11/30 Vancomycin >>   Dose adjustments this admission:  Microbiology results: 11/30 BCx:  11/30 MRSA PCR: negative  Thank you for allowing pharmacy to be a part of this patient's care.  Biagio Borg 12/18/2017 6:21 PM

## 2017-12-18 NOTE — Progress Notes (Signed)
Patient transferred to [facility] via bed. [phone] Son Leotha Westermeyer informed of transfer.

## 2017-12-18 NOTE — Procedures (Signed)
Intubation Procedure Note ADARIA HOLE [id number] 16-Jun-1928  Procedure: Intubation Indications: Respiratory insufficiency, hcap , metabolic acidosis, shock  Procedure Details Consent: Risks of procedure as well as the alternatives and risks of each were explained to the (patient/caregiver).  Consent for procedure obtained. Time Out: Verified patient identification, verified procedure, site/side was marked, verified correct patient position, special equipment/implants available, medications/allergies/relevent history reviewed, required imaging and test results available.  Performed  Drugs:  20 mg Etomidate, 50 mg Rocuronium. Glidoscipe with # 4 blade. Grade 2 view. 7.5 tube visualized passing through vocal cords. Following intubation:  positive color change on ETCO2, condensation seen in endotracheal tube, equal breath sounds bilaterally.  Evaluation Hemodynamic Status: Persistent hypotension treated with pressors; O2 sats: currently acceptable Patient's Current Condition: unstable Complications: No apparent complications Patient did tolerate procedure well. Chest X-ray ordered to verify placement.  CXR: tube position acceptable.    R. Claudie Leach MD

## 2017-12-18 NOTE — Progress Notes (Signed)
[facility]  PROGRESS NOTE  [name] [name] FVC:[id number] DOB: 1928-09-23 DOA: 11/24/2017 PCP: [name], MD   Brief HPI:   84 yr old female with anemia, anxiety, osteoarthritis, atrophic gastritis, blind loop syndrome, fatty liver disease, mitral valve prolapse, history of PVC's. CT abdomen/pelvis showed distended gall bladder.  Lipase was elevated to 2513.  Lactic acid elevated to 7.2    Subjective   This morning patient is tachypneic, lactic acid elevated to 7.2.  Complains of abdominal pain.   Assessment/Plan:     1. Acute pancreatitis-patient is on IV normal saline 0.45%, will switch to normal saline at 125 mL/h.  Patient is currently n.p.o.  Will check lipase level in a.m.    2. Sepsis-patient has metabolic acidosis with lactic acid 7.2, ABG obtained this morning showed metabolic acidosis with respiratory compensation, PCO2 25.3.  Continue empiric antibiotics IV Zosyn.  Will obtain chest x-ray.  Repeat lactic acid.  Will consult PCCM as patient might need intubation, she is tachypneic with respirations greater than 40/min.  3. Hypertension-antihypertensive medications on hold due to hypotension.  4. GERD-continue famotidine 20 mg IV every 12 hours.     CBG: No results for input(s): GLUCAP in the last 168 hours.  CBC: Recent Labs  Lab 11/29/2017 1618 12/18/17 0532 12/18/17 1123  WBC 5.0 2.3* 2.1*  NEUTROABS  --  1.8  --   HGB 13.1 14.4 12.0  HCT 43.2 50.1* 40.6  MCV 90.0 96.2 94.9  PLT 219 198 638    Basic Metabolic Panel: Recent Labs  Lab 12/12/2017 1618 12/18/17 0532  NA 138 139  K 5.0 6.1*  CL 103 105  CO2 25 17*  GLUCOSE 104* 133*  BUN 15 21  CREATININE 1.20* 2.04*  CALCIUM 8.7* 7.4*     DVT prophylaxis: SCDs  Code Status: Full code  Family Communication: No family at bedside  Disposition Plan: To be decided   Consultants:  None  Procedures:  None   Antibiotics:   Anti-infectives (From admission, onward)   Start      Dose/Rate Route Frequency Ordered Stop   12/18/17 0400  piperacillin-tazobactam (ZOSYN) IVPB 3.375 g     3.375 g 12.5 mL/hr over 240 Minutes Intravenous Every 8 hours 12/18/17 0237     12/16/2017 2000  piperacillin-tazobactam (ZOSYN) IVPB 3.375 g     3.375 g 100 mL/hr over 30 Minutes Intravenous  Once 11/20/2017 1958 11/19/2017 2208       Objective   Vitals:   12/18/17 1120 12/18/17 1130 12/18/17 1140 12/18/17 1150  BP: (!) 84/53 (!) 84/50 (!) 87/49 (!) 90/52  Pulse: (!) 111 (!) 111 (!) 111 (!) 110  Resp: (!) 29 (!) 37 (!) 33 (!) 25  Temp:    (!) 100.4 F (38 C)  TempSrc:      SpO2: 96% 95% 98% 96%  Weight:      Height:        Intake/Output Summary (Last 24 hours) at 12/18/2017 1209 Last data filed at 12/18/2017 1100 Gross per 24 hour  Intake 3403.13 ml  Output -  Net 3403.13 ml   Filed Weights   12/07/2017 1508 12/18/17 0400  Weight: 81.6 kg 81.9 kg     Physical Examination:    General: Appears in respiratory distress  Cardiovascular: S1-S2, regular, no murmurs auscultated  Respiratory: Clear to auscultation bilaterally  Abdomen: Soft, epigastric tenderness to palpation, no organomegaly  Extremities: No edema of the lower extremities  Neurologic: Alert, oriented x3, no focal deficit  noted     Data Reviewed: I have personally reviewed following labs and imaging studies   Recent Results (from the past 240 hour(s))  MRSA PCR Screening     Status: None   Collection Time: 12/18/17  4:07 AM  Result Value Ref Range Status   MRSA by PCR NEGATIVE NEGATIVE Final    Comment:        The GeneXpert MRSA Assay (FDA approved for NASAL specimens only), is one component of a comprehensive MRSA colonization surveillance program. It is not intended to diagnose MRSA infection nor to guide or monitor treatment for MRSA infections. Performed at [facility], [address], [place], [place] [address]      Liver Function Tests: Recent Labs  Lab  11/28/2017 1618 12/18/17 0532  AST 145* 166*  ALT 69* 113*  ALKPHOS 47 31*  BILITOT 1.7* 1.3*  PROT 7.3 6.4*  ALBUMIN 4.2 3.3*   Recent Labs  Lab 11/23/2017 1618  LIPASE 2,513*   No results for input(s): AMMONIA in the last 168 hours.  Cardiac Enzymes: Recent Labs  Lab 12/18/17 0532  TROPONINI <0.03   BNP (last 3 results) No results for input(s): BNP in the last 8760 hours.  ProBNP (last 3 results) No results for input(s): PROBNP in the last 8760 hours.    Studies: Dg Chest 1 View  Result Date: 12/18/2017 CLINICAL DATA:  Status post central line placement today. EXAM: CHEST  1 VIEW COMPARISON:  PA and lateral chest 07/10/2017. FINDINGS: Right IJ central venous catheter tip is near the superior cavoatrial junction. No pneumothorax. There is cardiomegaly with moderate to moderately large bilateral pleural effusions and basilar airspace disease, worse on the right. Aortic atherosclerosis is noted. No acute bony abnormality. Postoperative change right shoulder is identified. IMPRESSION: Right IJ central venous catheter tip projects near the superior cavoatrial junction. Negative for pneumothorax. Right greater than left pleural effusions and basilar airspace disease which could be atelectasis or pneumonia. Electronically Signed   By: Inge Rise M.D.   On: 12/18/2017 10:44   Ct Abdomen Pelvis W Contrast  Result Date: 11/30/2017 CLINICAL DATA:  Abdominal pain and distention. EXAM: CT ABDOMEN AND PELVIS WITH CONTRAST TECHNIQUE: Multidetector CT imaging of the abdomen and pelvis was performed using the standard protocol following bolus administration of intravenous contrast. CONTRAST:  157mL ISOVUE-300 IOPAMIDOL (ISOVUE-300) INJECTION 61% COMPARISON:  06/13/2016 FINDINGS: Lower chest: Bilateral lung base airspace opacities, most evident in the lower lobes. This may all be atelectasis. Consider pneumonia in the proper clinical setting. Hepatobiliary: There is intra and extrahepatic  bile duct dilation. Common bile duct measures a maximum of 13 mm. Duct is dilated into the pancreatic head, where it intersects a medial duodenal diverticulum. There is milder intra hepatic bile duct dilation. Biliary dilation is similar to the prior CT. 7 mm low-density lesion is noted near the dome of the right lobe, stable consistent with a cyst. No other liver masses or lesions. Liver demonstrates diffusely decreased attenuation consistent with fatty infiltration. Gallbladder is significantly distended. There is no definitive wall thickening and no visualized stones. Pancreas: Mild dilation of the pancreatic duct to a maximum of 3 mm similar to the prior CT. No pancreatic mass or inflammation. Spleen: Normal in size without focal abnormality. Adrenals/Urinary Tract: Mild adrenal gland thickening. No discrete masses. This is stable. Mild bilateral renal cortical thinning. No renal masses, stones or hydronephrosis. There is symmetric renal enhancement and excretion. Ureters are normal in course and in caliber. Bladder  is unremarkable. Stomach/Bowel: Stomach is mild-to-moderately distended. No wall thickening or convincing inflammation. Small bowel is normal in caliber. No wall thickening or inflammation. There are scattered colonic diverticula. No diverticulitis. No colonic wall thickening or other inflammatory process. An anastomosis staple line lies along the inferior right colon stable from the prior CT. Vascular/Lymphatic: No enlarged lymph nodes. Aortic atherosclerosis. No aneurysm. Reproductive: Uterus and bilateral adnexa are unremarkable. Other: There is a small amount of ascites, which is predominantly adjacent to the liver and distended gallbladder but also lies along the leaves of the mesentery. Musculoskeletal: No fracture or acute finding. No osteoblastic or osteolytic lesions. IMPRESSION: 1. Gallbladder is distended and there is a small amount of ascites that is centered in the right upper quadrant.  Although there is no convincing gallbladder wall thickening and no visualized stone, the findings raise suspicion for acute cholecystitis. Recommend follow-up limited right upper quadrant ultrasound for further assessment. 2. There is intra and extrahepatic bile duct dilation. This is similar to the prior CT, and therefore presumed chronic. No visualized stone or obstructing mass. There is a medial duodenal diverticulum at the level of the ampullary. 3. Bilateral lung base opacities, mostly in the lower lobes, which may all reflect atelectasis. Consider pneumonia in the proper clinical setting. 4. No other evidence an acute abnormality. No bowel obstruction or inflammation. Electronically Signed   By: Lajean Manes M.D.   On: 11/21/2017 19:49   US Abdomen Limited Ruq  Result Date: 11/28/2017 CLINICAL DATA:  Patient with abdominal pain beginning today. Distended gallbladder and ascites noted on CT. Patient with elevated liver enzymes and elevated lipase. EXAM: ULTRASOUND ABDOMEN LIMITED RIGHT UPPER QUADRANT COMPARISON:  Current abdomen and pelvis CT. FINDINGS: Gallbladder: The gallbladder is distended, but contains no stones. There is no sludge. No wall thickening. There is pericholecystic fluid, which is part of the ascites. Common bile duct: Diameter: 12 mm.  No duct stone visualized. Liver: Increased echogenicity consistent with hepatic steatosis as noted on CT. No mass or focal lesion. Small cyst at the dome of the right lobe not visualized sonographically. Portal vein is patent on color Doppler imaging with normal direction of blood flow towards the liver. Fluid noted adjacent to the liver mostly along its inferior margin, as well as adjacent to the gallbladder. IMPRESSION: 1. No sonographic findings acute cholecystitis. Gallbladder is distended, but there is no wall thickening. No gallstones. 2. Dilated common bile duct. This is presumed chronic since it is similar to previous CT scans. No duct stone  visualized. 3. Ascites as noted on the current CT. 4. Hepatic steatosis. Electronically Signed   By: Lajean Manes M.D.   On: 11/20/2017 21:10    Scheduled Meds: . Chlorhexidine Gluconate Cloth  6 each Topical Q0600  . enoxaparin (LOVENOX) injection  30 mg Subcutaneous Q24H    Admission status: Inpatient: Based on patients clinical presentation and evaluation of above clinical data, I have made determination that patient meets Inpatient criteria at this time. Patient is on IV zosyn, IV Levophed.  Time spent: 30 min  [facility] Pager [phone]. If 7PM-7AM, please contact night-coverage at [email or web address], Office  [phone]  password [place]  12/18/2017, 12:09 PM  LOS: 1 day

## 2017-12-18 NOTE — Procedures (Signed)
Arterial Catheter Insertion Procedure Note CHIOMA MUKHERJEE [id number] [birth year]  Procedure: Insertion of Arterial Catheter  Indications: Blood pressure monitoring  Procedure Details Consent: Unable to obtain consent because of emergent medical necessity. Time Out: Verified patient identification, verified procedure, site/side was marked, verified correct patient position, special equipment/implants available, medications/allergies/relevent history reviewed, required imaging and test results available.  Performed  Maximum sterile technique was used including antiseptics, cap, gloves, gown, hand hygiene, mask and sheet. Skin prep: Chlorhexidine; local anesthetic administered 20 gauge catheter was inserted into left radial artery using the Seldinger technique. ULTRASOUND GUIDANCE USED: NO Evaluation Blood flow good; BP tracing good. Complications: No apparent complications.   Rosann Auerbach 12/18/2017

## 2017-12-18 NOTE — Progress Notes (Signed)
NAME:  [name] [name], MRN:  [id number], DOB:  04/21/1928, LOS: 1 ADMISSION DATE:  12/09/2017, CONSULTATION DATE:  11/30 REFERRING MD:  Darrick Meigs, CHIEF COMPLAINT:  SOB, abdominal pain.  Brief History   Admitted 11/29 with severe pancreatitis, started on pressors this afternoon , intubated around 9:00 pm 12/18/17  History of present illness   84 yr old female with anemia, anxiety, osteoarthritis, atrophic gastritis, blind loop syndrome, fatty liver disease, mitral valve prolapse, history of PVC's admitted with diffuse abdominal pain on 11/29 and sepsis. CT abdomen/pelvis showed distended gall bladder.  Lipase was elevated to 2513.  Lactic acid elevated to 7.2 Transferred to the ICU with worsening acidosis and hypotension. Went into resp failure tonight and had to be emergently intubated as she failed BIPAP 100% and was obtunded with sats in the 70's and SBP in the 70's.  Past Medical History  As stated in the [facility] Events   11/29 - ADMITTED 11/30 [place] TO ICU, on pressors and intubated  Consults:  Will consult nephrology , tonight or tomorrow depending on next numbers.  Procedures:  RIJ central line 11/30  Significant Diagnostic Tests:  CT scan abdomen/pelvis - distended gallbladder , U/S does not show any cholecystitis, similar to previous appearance  Micro Data:  pending  Antimicrobials:  Zosyn and vancomycin day 1  Interim history/subjective:  Ask to see patient for resp failure , intubated emergently for hypoxemia and mental status.  Objective   Blood pressure (!) 90/57, pulse (!) 109, temperature 98 F (36.7 C), temperature source Axillary, resp. rate (!) 31, height 5\' 5"  (1.651 m), weight 81.9 kg, SpO2 98 %. CVP:  [8 mmHg-72 mmHg] 13 mmHg  Vent Mode: PRVC FiO2 (%):  [60 %] 60 % Set Rate:  [26 bmp-30 bmp] 30 bmp Vt Set:  [440 mL-480 mL] 480 mL PEEP:  [5 cmH20] 5 cmH20 Plateau Pressure:  [18 cmH20-22 cmH20] 22 cmH20   Intake/Output Summary (Last 24  hours) at 12/18/2017 2157 Last data filed at 12/18/2017 1800 Gross per 24 hour  Intake 3824.95 ml  Output 350 ml  Net 3474.95 ml   Filed Weights   12/10/2017 1508 12/18/17 0400  Weight: 81.6 kg 81.9 kg    Examination: General: The patient is obtunded on the vent , does not follow any commands HEENT: The scalp is atraumatic. PERRL. EOMI. Conjunctivae and sclerae are normal. There is no icterus. Dry MM Neck: Supple. No submandibular/cervical LA. Trachea midline, No thyromegaly.  Lungs: diffuse crackles and ronchi bilaterally Cardiovascular: Normal Z8/H8, with diastolic murmur Abdomen: no guarding or rebound , mostly soft with absent BS+ Extremities: Warm and well perfused. Trace pedal edema Skin: Intact with no rashes, excessive bruising, or petechiae. Capillary refill ~2 seconds MSK: unable to test strength and sensation but she was moving all ext before intubation Neuro:moved all ext , did not follow commands to me    Assessment & Plan:  Distributive, septic shock - secondary to severe acute pancreatitis and bilateral lower lobe pneumonia , suspect aspiration. In terms of causes of pancreatitis , unclear , her CT abdomen shows dilated CBD and gallbladder but not any different from before. RUQ U/S does not show any stone either. Her LFT 's are normal, lipase and amylase elevated.Calcium is low, replacing. Needs aggressive fluid resuscitation, currently receiving crystalloid L 4 and 5 . Levophed at 30 mcgs and vasopressin , titrate MAP > 65 , A-line placed. IVC U/S at bedside shows almost complete collapse with inspiration, this was  before 4 and 5. Will re-assess  AKI secondary to ATN also with Hyperkalemia and severe high AG metabolic acidosis , lactic acidosis worsening - start bicarbonate drip , cont fluid resuscitation. Repeat chemistry now , she is not making much urine and might need CRRT. Will need nephro to see. Repeat lactic acid pending as well.  Acute resp failure with  hypoxemia secondary to HCAP and fluid overload - likely aspiration into the airway. Zosyn and Vanco , cultures pending  Intubated 11/30 Full vent support , current MV at 14.2 , PP < 20 , PCO2 in the low 20's with a pH of 7.03, not much room with the vent , bicarb drip initiated Echocardiogram ordered  Hx of SBO - no evidence of this on CT   Best practice:  Diet: npo , OGT to IS Pain/Anxiety/Delirium protocol (if indicated): versed and fentanyl VAP protocol (if indicated): yes DVT prophylaxis: HSQ GI prophylaxis: pepcid Glucose control: Niss Mobility: strict bed rest Code Status: FULL Family Communication: daughter on her way Disposition: ICU , critically ill with MOD.  Labs   CBC: Recent Labs  Lab 12/08/2017 1618 12/18/17 0532 12/18/17 1123 12/18/17 2000  WBC 5.0 2.3* 2.1* 2.3*  NEUTROABS  --  1.8  --   --   HGB 13.1 14.4 12.0 11.5*  HCT 43.2 50.1* 40.6 40.3  MCV 90.0 96.2 94.9 96.0  PLT 219 198 153 450    Basic Metabolic Panel: Recent Labs  Lab 12/07/2017 1618 12/18/17 0532 12/18/17 1123 12/18/17 1953  NA 138 139 139 136  K 5.0 6.1* 4.8 5.3*  CL 103 105 109 112*  CO2 25 17* 15* 12*  GLUCOSE 104* 133* 136* 148*  BUN 15 21 27* 31*  CREATININE 1.20* 2.04* 2.32* 2.53*  CALCIUM 8.7* 7.4* 6.5* 5.9*   GFR: Estimated Creatinine Clearance: 16.6 mL/min (A) (by C-G formula based on SCr of 2.53 mg/dL (H)). Recent Labs  Lab 12/12/2017 1618 12/18/17 0532 12/18/17 1123 12/18/17 2000  WBC 5.0 2.3* 2.1* 2.3*  LATICACIDVEN  --  7.2* 5.5* 5.7*    Liver Function Tests: Recent Labs  Lab 12/04/2017 1618 12/18/17 0532 12/18/17 1953  AST 145* 166* 90*  ALT 69* 113* 66*  ALKPHOS 47 31* 19*  BILITOT 1.7* 1.3* 0.8  PROT 7.3 6.4* 4.8*  ALBUMIN 4.2 3.3* 2.7*   Recent Labs  Lab 12/07/2017 1618 12/18/17 1123  LIPASE 2,513* 2,513*  AMYLASE  --  2,241*   No results for input(s): AMMONIA in the last 168 hours.  ABG    Component Value Date/Time   PHART 7.037 (LL)  12/18/2017 2100   PCO2ART 38.4 12/18/2017 2100   PO2ART 86.6 12/18/2017 2100   HCO3 9.8 (L) 12/18/2017 2100   ACIDBASEDEF 19.8 (H) 12/18/2017 2100   O2SAT 90.9 12/18/2017 2100     Coagulation Profile: Recent Labs  Lab 12/18/17 0806 12/18/17 2000  INR 1.17 1.53    Cardiac Enzymes: Recent Labs  Lab 12/18/17 0532 12/18/17 1123 12/18/17 1730  TROPONINI <0.03 0.03* 0.05*    HbA1C: Hgb A1c MFr Bld  Date/Time Value Ref Range Status  09/28/2010 03:06 AM 6.0 (H) <5.7 % Final    Comment:    (NOTE)  According to the ADA Clinical Practice Recommendations for 2011, when HbA1c is used as a screening test:  >=6.5%   Diagnostic of Diabetes Mellitus           (if abnormal result is confirmed) 5.7-6.4%   Increased risk of developing Diabetes Mellitus References:Diagnosis and Classification of Diabetes Mellitus,Diabetes JZPH,1505,69(VXYIA 1):S62-S69 and Standards of Medical Care in         Diabetes - 2011,Diabetes XKPV,3748,27 (Suppl 1):S11-S61.    CBG: No results for input(s): GLUCAP in the last 168 hours.  Review of Systems:   Unable to complete as patient is obtunded and no family available at this time.  Past Medical History  She,  has a past medical history of Allergy, Anemia, Anxiety, Arthritis, Atrophic gastritis without mention of hemorrhage, Blind loop syndrome, Complication of anesthesia, Fatty liver, GERD (gastroesophageal reflux disease), Hiatal hernia, Hypertension, MVP (mitral valve prolapse), PVC's (premature ventricular contractions), Schatzki's ring, Small bowel obstruction ([place]), and Vitamin B12 deficiency.   Surgical History    Past Surgical History:  Procedure Laterality Date  . CATARACT EXTRACTION, BILATERAL  03-26-11  . HEMICOLECTOMY     and ileectomy Dr Vida Rigger; due to perforated viscus  . I&D KNEE WITH POLY EXCHANGE  04/03/2011   Procedure: IRRIGATION AND DEBRIDEMENT KNEE WITH POLY  EXCHANGE;  Surgeon: Mcarthur Rossetti, MD;  Location: [facility];  Service: Orthopedics;  Laterality: Right;  . KNEE CLOSED REDUCTION  01/22/2011   Procedure: CLOSED MANIPULATION KNEE;  Surgeon: Mcarthur Rossetti;  Location: [facility];  Service: Orthopedics;  Laterality: Right;  Manipulation under anesthesia right knee  . Nuclear stress test  Sept 2012   Normal  . REPLACEMENT TOTAL KNEE     right  . ROTATOR CUFF REPAIR  03-26-11   right arm  . SYNOVECTOMY  04/03/2011   Procedure: SYNOVECTOMY;  Surgeon: Mcarthur Rossetti, MD;  Location: [facility];  Service: Orthopedics;  Laterality: Right;  . TOTAL KNEE ARTHROPLASTY Left 11/13/2014   Procedure: TOTAL KNEE ARTHROPLASTY;  Surgeon: Paralee Cancel, MD;  Location: [facility];  Service: Orthopedics;  Laterality: Left;  . TOTAL KNEE REVISION Right 07/14/2012   Procedure: RIGHT TOTAL KNEE REVISION;  Surgeon: Mauri Pole, MD;  Location: [facility];  Service: Orthopedics;  Laterality: Right;  . TUBAL LIGATION       Social History   reports that she has never smoked. She has never used smokeless tobacco. She reports that she does not drink alcohol or use drugs.   Family History   Her family history includes Coronary artery disease in her brother; Deep vein thrombosis in her sister; Diabetes in her brother and sister; Hypertension in her sister. There is no history of Colon cancer or Colon polyps.   Allergies No Known Allergies   Home Medications  Prior to Admission medications   Medication Sig Start Date End Date Taking? Authorizing Provider  acetaminophen (TYLENOL) 500 MG tablet Take 1,000 mg by mouth every 6 (six) hours as needed for moderate pain.   Yes [provider]  alprazolam Duanne Moron) 2 MG tablet TAKE 2 TABLETS AT BEDTIME AS NEEDED FOR SLEEP 07/05/17  Yes [provider]  amLODipine (NORVASC) 10 MG tablet Take 10 mg by mouth every morning.  11/01/17  Yes [provider]  calcium carbonate (TUMS - DOSED IN MG ELEMENTAL  CALCIUM) 500 MG chewable tablet Chew 1 tablet by mouth daily as needed for indigestion or heartburn.   Yes [provider]  DEXILANT 60 MG capsule Take 60  mg by mouth daily.  07/03/17  Yes [provider]  famotidine (PEPCID) 20 MG tablet Take 1 tablet (20 mg total) by mouth daily. 06/19/16 12/07/2017 Yes Doreatha Lew, MD  loratadine (CLARITIN) 10 MG tablet Take 10 mg by mouth daily as needed for allergies.    Yes [provider]  losartan (COZAAR) 25 MG tablet Take 25 mg by mouth every morning. 11/01/17  Yes [provider]  potassium chloride (K-DUR) 10 MEQ tablet Take 1 tablet (10 mEq total) by mouth 2 (two) times daily. 06/19/16 12/18/2017 Yes Doreatha Lew, MD  traMADol (ULTRAM) 50 MG tablet Take 50 mg by mouth every 6 (six) hours as needed for moderate pain or severe pain.  07/08/17  Yes [provider]  Vitamin D, Ergocalciferol, (DRISDOL) 50000 UNITS CAPS Take 50,000 Units by mouth every 7 (seven) days. Friday   Yes [provider]  ALPRAZolam (XANAX) 0.25 MG tablet Take 1 tablet (0.25 mg total) by mouth 4 (four) times daily as needed. ANXIETY/ SPASM Patient not taking: Reported on 11/25/2017 07/15/12   Danae Orleans, PA-C  polyethylene glycol [facility]) packet Take 17 g by mouth daily as needed for mild constipation. Patient not taking: Reported on 07/10/2017 02/28/16   Barton Dubois, MD     Critical care time spent with pt, reviewing pt's medical record/chart, pertinent labs/imaging, discussing management plan with ICU multidisciplinary team, making medical decisions, managing pt's resp failure/vent management, excluding procedures : 60 minutes.

## 2017-12-18 NOTE — Consult Note (Signed)
NAME:  [name] [name], MRN:  [id number], DOB:  02/14/1928, LOS: 1 ADMISSION DATE:  11/30/2017, CONSULTATION DATE:  12/18/17 REFERRING MD:  Darrick Meigs, CHIEF COMPLAINT:  Pancreatitis   Brief History   Patient was admitted with pancreatitis Came in with progressive abdominal discomfort Started on the day of presentation History of hiatal hernia, GERD, small bowel obstruction  History of present illness    Presented to the hospital with worsening abdominal pain Was having protracted dry heaving Has not been feeling sick recently She had no diarrhea, no constipation at presentation  Past Medical History  Small bowel obstruction History of fatty liver History of GERD Hypertension Mitral valve prolapse Anxiety  Significant Hospital Events   Hypotension Tachypnea-on BiPAP at present Consults:  pccm  Procedures:  Central venous access  Significant Diagnostic Tests:  CT scan of the abdomen IMPRESSION: 1. Gallbladder is distended and there is a small amount of ascites that is centered in the right upper quadrant. Although there is no convincing gallbladder wall thickening and no visualized stone, the findings raise suspicion for acute cholecystitis. Recommend follow-up limited right upper quadrant ultrasound for further assessment. 2. There is intra and extrahepatic bile duct dilation. This is similar to the prior CT, and therefore presumed chronic. No visualized stone or obstructing mass. There is a medial duodenal diverticulum at the level of the ampullary. 3. Bilateral lung base opacities, mostly in the lower lobes, which may all reflect atelectasis. Consider pneumonia in the proper clinical setting. 4. No other evidence an acute abnormality. No bowel obstruction or Inflammation.  Ultrasound abdomen IMPRESSION: 1. No sonographic findings acute cholecystitis. Gallbladder is distended, but there is no wall thickening. No gallstones. 2. Dilated common bile duct. This is  presumed chronic since it is similar to previous CT scans. No duct stone visualized. 3. Ascites as noted on the current CT. 4. Hepatic steatosis.  Micro Data:  MRSA PCR negative  Antimicrobials:  Zosyn 11/29>>  Interim history/subjective:  Patient was admitted to the medical floor Progressive deterioration led to transfer to the intensive care unit Hypotension, tachypnea Now on BiPAP  Objective   Blood pressure (!) 109/41, pulse (!) 117, temperature 98.6 F (37 C), temperature source Oral, resp. rate (!) 43, height 5\' 5"  (1.651 m), weight 81.9 kg, SpO2 96 %.        Intake/Output Summary (Last 24 hours) at 12/18/2017 1014 Last data filed at 12/18/2017 0300 Gross per 24 hour  Intake 2659.3 ml  Output -  Net 2659.3 ml   Filed Weights   12/16/2017 1508 12/18/17 0400  Weight: 81.6 kg 81.9 kg    Examination: General: Elderly lady, short of breath HENT: On BiPAP, Lungs: Fair air entry bilaterally with no wheezes, no rales Cardiovascular: S1-S2 appreciated Abdomen: Bowel sounds appreciated, Extremities: No edema Neuro: Alert, oriented x3, moving all extremities  Resolved Hospital Problem list     Assessment & Plan:  Pancreatitis -N.p.o. -We will continue IV fluids -Pain management -Follow amylase, lipase -We will continue Zosyn -Normal saline at 250 cc an hour  Shock -Started on levo fed -Titrate to MAP of 65 and above  History of hypertension -Hold antihypertensives  History of reflux -PPI  Transaminitis -Related to pancreatitis  History of bowel obstruction -No evidence of bowel obstruction on CT scan of the abdomen  Obtain blood cultures x2 Follow lipase levels Follow lactate  If she decompensates any further, may require to be on a ventilator   Best practice:  Diet: N.p.o. at present Pain/Anxiety/Delirium  protocol (if indicated): Morphine as ordered VAP protocol (if indicated): Not applicable DVT prophylaxis: Enoxaparin GI prophylaxis:  Pepcid Glucose control: SSI Mobility: Bedrest Code Status: Full code Family Communication: No family at bedside at present Disposition:   Labs   CBC: Recent Labs  Lab 11/23/2017 1618 12/18/17 0532  WBC 5.0 2.3*  NEUTROABS  --  1.8  HGB 13.1 14.4  HCT 43.2 50.1*  MCV 90.0 96.2  PLT 219 099    Basic Metabolic Panel: Recent Labs  Lab 11/21/2017 1618 12/18/17 0532  NA 138 139  K 5.0 6.1*  CL 103 105  CO2 25 17*  GLUCOSE 104* 133*  BUN 15 21  CREATININE 1.20* 2.04*  CALCIUM 8.7* 7.4*   GFR: Estimated Creatinine Clearance: 20.5 mL/min (A) (by C-G formula based on SCr of 2.04 mg/dL (H)). Recent Labs  Lab 12/15/2017 1618 12/18/17 0532  WBC 5.0 2.3*  LATICACIDVEN  --  7.2*    Liver Function Tests: Recent Labs  Lab 11/25/2017 1618 12/18/17 0532  AST 145* 166*  ALT 69* 113*  ALKPHOS 47 31*  BILITOT 1.7* 1.3*  PROT 7.3 6.4*  ALBUMIN 4.2 3.3*   Recent Labs  Lab 12/11/2017 1618  LIPASE 2,513*   No results for input(s): AMMONIA in the last 168 hours.  ABG    Component Value Date/Time   PHART 7.312 (L) 12/18/2017 0825   PCO2ART 25.3 (L) 12/18/2017 0825   PO2ART 72.9 (L) 12/18/2017 0825   HCO3 12.4 (L) 12/18/2017 0825   ACIDBASEDEF 12.1 (H) 12/18/2017 0825   O2SAT 92.9 12/18/2017 0825     Coagulation Profile: Recent Labs  Lab 12/18/17 0806  INR 1.17    Cardiac Enzymes: Recent Labs  Lab 12/18/17 0532  TROPONINI <0.03    HbA1C: Hgb A1c MFr Bld  Date/Time Value Ref Range Status  09/28/2010 03:06 AM 6.0 (H) <5.7 % Final    Comment:    (NOTE)                                                                       According to the ADA Clinical Practice Recommendations for 2011, when HbA1c is used as a screening test:  >=6.5%   Diagnostic of Diabetes Mellitus           (if abnormal result is confirmed) 5.7-6.4%   Increased risk of developing Diabetes Mellitus References:Diagnosis and Classification of Diabetes Mellitus,Diabetes IPJA,2505,39(JQBHA  1):S62-S69 and Standards of Medical Care in         Diabetes - 2011,Diabetes LPFX,9024,09 (Suppl 1):S11-S61.    CBG: No results for input(s): GLUCAP in the last 168 hours.  Review of Systems:   Review of Systems  Constitutional: Positive for fever.  HENT: Negative.   Eyes: Negative.   Respiratory: Positive for shortness of breath.   Cardiovascular: Negative.   Gastrointestinal: Negative.   Skin: Negative.   All other systems reviewed and are negative.    Past Medical History  She,  has a past medical history of Allergy, Anemia, Anxiety, Arthritis, Atrophic gastritis without mention of hemorrhage, Blind loop syndrome, Complication of anesthesia, Fatty liver, GERD (gastroesophageal reflux disease), Hiatal hernia, Hypertension, MVP (mitral valve prolapse), PVC's (premature ventricular contractions), Schatzki's ring, Small bowel obstruction ([place]), and Vitamin B12 deficiency.  Surgical History    Past Surgical History:  Procedure Laterality Date  . CATARACT EXTRACTION, BILATERAL  03-26-11  . HEMICOLECTOMY     and ileectomy Dr Vida Rigger; due to perforated viscus  . I&D KNEE WITH POLY EXCHANGE  04/03/2011   Procedure: IRRIGATION AND DEBRIDEMENT KNEE WITH POLY EXCHANGE;  Surgeon: Mcarthur Rossetti, MD;  Location: [facility];  Service: Orthopedics;  Laterality: Right;  . KNEE CLOSED REDUCTION  01/22/2011   Procedure: CLOSED MANIPULATION KNEE;  Surgeon: Mcarthur Rossetti;  Location: [place];  Service: Orthopedics;  Laterality: Right;  Manipulation under anesthesia right knee  . Nuclear stress test  Sept 2012   Normal  . REPLACEMENT TOTAL KNEE     right  . ROTATOR CUFF REPAIR  03-26-11   right arm  . SYNOVECTOMY  04/03/2011   Procedure: SYNOVECTOMY;  Surgeon: Mcarthur Rossetti, MD;  Location: [facility];  Service: Orthopedics;  Laterality: Right;  . TOTAL KNEE ARTHROPLASTY Left 11/13/2014   Procedure: TOTAL KNEE ARTHROPLASTY;  Surgeon: Paralee Cancel, MD;  Location: [facility];  Service:  Orthopedics;  Laterality: Left;  . TOTAL KNEE REVISION Right 07/14/2012   Procedure: RIGHT TOTAL KNEE REVISION;  Surgeon: Mauri Pole, MD;  Location: [facility];  Service: Orthopedics;  Laterality: Right;  . TUBAL LIGATION       Social History   reports that she has never smoked. She has never used smokeless tobacco. She reports that she does not drink alcohol or use drugs.   Family History   Her family history includes Coronary artery disease in her brother; Deep vein thrombosis in her sister; Diabetes in her brother and sister; Hypertension in her sister. There is no history of Colon cancer or Colon polyps.   Allergies No Known Allergies   Home Medications  Prior to Admission medications   Medication Sig Start Date End Date Taking? Authorizing Provider  acetaminophen (TYLENOL) 500 MG tablet Take 1,000 mg by mouth every 6 (six) hours as needed for moderate pain.   Yes [provider]  alprazolam Duanne Moron) 2 MG tablet TAKE 2 TABLETS AT BEDTIME AS NEEDED FOR SLEEP 07/05/17  Yes [provider]  amLODipine (NORVASC) 10 MG tablet Take 10 mg by mouth every morning.  11/01/17  Yes [provider]  calcium carbonate (TUMS - DOSED IN MG ELEMENTAL CALCIUM) 500 MG chewable tablet Chew 1 tablet by mouth daily as needed for indigestion or heartburn.   Yes [provider]  DEXILANT 60 MG capsule Take 60 mg by mouth daily.  07/03/17  Yes [provider]  famotidine (PEPCID) 20 MG tablet Take 1 tablet (20 mg total) by mouth daily. 06/19/16 12/03/2017 Yes Doreatha Lew, MD  loratadine (CLARITIN) 10 MG tablet Take 10 mg by mouth daily as needed for allergies.    Yes [provider]  losartan (COZAAR) 25 MG tablet Take 25 mg by mouth every morning. 11/01/17  Yes [provider]  potassium chloride (K-DUR) 10 MEQ tablet Take 1 tablet (10 mEq total) by mouth 2 (two) times daily. 06/19/16 11/23/2017 Yes Doreatha Lew, MD  traMADol (ULTRAM) 50 MG  tablet Take 50 mg by mouth every 6 (six) hours as needed for moderate pain or severe pain.  07/08/17  Yes [provider]  Vitamin D, Ergocalciferol, (DRISDOL) 50000 UNITS CAPS Take 50,000 Units by mouth every 7 (seven) days. Friday   Yes [provider]  ALPRAZolam (XANAX) 0.25 MG tablet Take 1 tablet (0.25 mg total) by mouth  4 (four) times daily as needed. ANXIETY/ SPASM Patient not taking: Reported on 12/14/2017 07/15/12   Danae Orleans, PA-C  polyethylene glycol [facility]) packet Take 17 g by mouth daily as needed for mild constipation. Patient not taking: Reported on 07/10/2017 02/28/16   Barton Dubois, MD     Critical care time: 35 min

## 2017-12-19 ENCOUNTER — Inpatient Hospital Stay (HOSPITAL_COMMUNITY): Payer: Medicare Other

## 2017-12-19 LAB — CBC WITH DIFFERENTIAL/PLATELET
ABS IMMATURE GRANULOCYTES: 0.02 10*3/uL (ref 0.00–0.07)
Basophils Absolute: 0 10*3/uL (ref 0.0–0.1)
Basophils Relative: 1 %
Eosinophils Absolute: 0 10*3/uL (ref 0.0–0.5)
Eosinophils Relative: 1 %
HCT: 24.4 % — ABNORMAL LOW (ref 36.0–46.0)
Hemoglobin: 6.5 g/dL — CL (ref 12.0–15.0)
Immature Granulocytes: 1 %
LYMPHS ABS: 0.6 10*3/uL — AB (ref 0.7–4.0)
Lymphocytes Relative: 29 %
MCH: 28.1 pg (ref 26.0–34.0)
MCHC: 26.6 g/dL — ABNORMAL LOW (ref 30.0–36.0)
MCV: 105.6 fL — ABNORMAL HIGH (ref 80.0–100.0)
Monocytes Absolute: 0.1 10*3/uL (ref 0.1–1.0)
Monocytes Relative: 4 %
Neutro Abs: 1.2 10*3/uL — ABNORMAL LOW (ref 1.7–7.7)
Neutrophils Relative %: 64 %
Platelets: 77 10*3/uL — ABNORMAL LOW (ref 150–400)
RBC: 2.31 MIL/uL — ABNORMAL LOW (ref 3.87–5.11)
RDW: 15.2 % (ref 11.5–15.5)
WBC: 1.9 10*3/uL — ABNORMAL LOW (ref 4.0–10.5)
nRBC: 1.6 % — ABNORMAL HIGH (ref 0.0–0.2)

## 2017-12-19 LAB — RENAL FUNCTION PANEL
ALBUMIN: 2.6 g/dL — AB (ref 3.5–5.0)
BUN: 24 mg/dL — ABNORMAL HIGH (ref 8–23)
CALCIUM: 5.1 mg/dL — AB (ref 8.9–10.3)
CO2: <7 mmol/L — ABNORMAL LOW (ref 22–32)
CREATININE: 2.48 mg/dL — AB (ref 0.44–1.00)
Chloride: 109 mmol/L (ref 98–111)
GFR calc Af Amer: 20 mL/min — ABNORMAL LOW (ref >60)
GFR calc non Af Amer: 17 mL/min — ABNORMAL LOW (ref >60)
Glucose, Bld: 209 mg/dL — ABNORMAL HIGH (ref 70–99)
Phosphorus: 6.7 mg/dL — ABNORMAL HIGH (ref 2.5–4.6)
Potassium: 5.7 mmol/L — ABNORMAL HIGH (ref 3.5–5.1)
Sodium: 138 mmol/L (ref 135–145)

## 2017-12-19 LAB — BLOOD GAS, ARTERIAL
Acid-base deficit: 25.4 mmol/L — ABNORMAL HIGH (ref 0.0–2.0)
Acid-base deficit: 26.1 mmol/L — ABNORMAL HIGH (ref 0.0–2.0)
Bicarbonate: 4.2 mmol/L — ABNORMAL LOW (ref 20.0–28.0)
Bicarbonate: 4.5 mmol/L — ABNORMAL LOW (ref 20.0–28.0)
Drawn by: 308601
Drawn by: 308601
FIO2: 60
FIO2: 60
MECHVT: 480 mL
MECHVT: 480 mL
O2 Saturation: 87.7 %
O2 Saturation: 92.6 %
PEEP: 5 cmH2O
PEEP: 5 cmH2O
Patient temperature: 35.8
Patient temperature: 98.2
RATE: 30 resp/min
RATE: 30 resp/min
pCO2 arterial: 21 mmHg — ABNORMAL LOW (ref 32.0–48.0)
pCO2 arterial: 23.6 mmHg — ABNORMAL LOW (ref 32.0–48.0)
pH, Arterial: 6.917 — CL (ref 7.350–7.450)
pH, Arterial: 6.926 — CL (ref 7.350–7.450)
pO2, Arterial: 81 mmHg — ABNORMAL LOW (ref 83.0–108.0)
pO2, Arterial: 98.7 mmHg (ref 83.0–108.0)

## 2017-12-19 LAB — HEPATIC FUNCTION PANEL
ALK PHOS: 12 U/L — AB (ref 38–126)
ALT: 198 U/L — ABNORMAL HIGH (ref 0–44)
AST: 198 U/L — ABNORMAL HIGH (ref 15–41)
Albumin: 2.6 g/dL — ABNORMAL LOW (ref 3.5–5.0)
Bilirubin, Direct: 0.3 mg/dL — ABNORMAL HIGH (ref 0.0–0.2)
Indirect Bilirubin: 0.3 mg/dL (ref 0.3–0.9)
Total Bilirubin: 0.6 mg/dL (ref 0.3–1.2)
Total Protein: 3.6 g/dL — ABNORMAL LOW (ref 6.5–8.1)

## 2017-12-19 LAB — MAGNESIUM: Magnesium: 1.4 mg/dL — ABNORMAL LOW (ref 1.7–2.4)

## 2017-12-19 LAB — APTT: aPTT: 69 seconds — ABNORMAL HIGH (ref 24–36)

## 2017-12-19 LAB — LIPASE, BLOOD: LIPASE: 1110 U/L — AB (ref 11–51)

## 2017-12-19 MED ORDER — STERILE WATER FOR INJECTION IV SOLN
INTRAVENOUS | Status: DC
  Administered 2017-12-19: 02:00:00 via INTRAVENOUS_CENTRAL
  Filled 2017-12-19 (×2): qty 150

## 2017-12-19 MED ORDER — SODIUM CHLORIDE 0.9 % IV SOLN
INTRAVENOUS | Status: DC
  Administered 2017-12-19: 01:00:00 via INTRAVENOUS

## 2017-12-19 MED ORDER — STERILE WATER FOR INJECTION IV SOLN
INTRAVENOUS | Status: DC
  Administered 2017-12-19: 02:00:00 via INTRAVENOUS_CENTRAL
  Filled 2017-12-19 (×3): qty 150

## 2017-12-19 MED ORDER — DOPAMINE-DEXTROSE 3.2-5 MG/ML-% IV SOLN: 2.5000 ug/kg/min | INTRAVENOUS | Status: DC

## 2017-12-19 MED ORDER — ALBUMIN HUMAN 5 % IV SOLN
50.0000 g | Freq: Once | INTRAVENOUS | Status: AC
  Administered 2017-12-19: 50 g via INTRAVENOUS
  Filled 2017-12-19: qty 1000

## 2017-12-19 MED ORDER — SODIUM CHLORIDE 0.9 % IV SOLN: INTRAVENOUS | Status: AC

## 2017-12-19 MED ORDER — HYDROCORTISONE NA SUCCINATE PF 100 MG IJ SOLR
100.0000 mg | Freq: Four times a day (QID) | INTRAMUSCULAR | Status: DC
  Administered 2017-12-19: 100 mg via INTRAVENOUS
  Filled 2017-12-19: qty 2

## 2017-12-19 MED ORDER — SODIUM CHLORIDE 0.9 % IV SOLN
100.0000 mg | Freq: Every day | INTRAVENOUS | Status: DC
  Filled 2017-12-19: qty 100

## 2017-12-19 MED ORDER — CALCIUM GLUCONATE-NACL 1-0.675 GM/50ML-% IV SOLN
1.0000 g | Freq: Once | INTRAVENOUS | Status: AC
  Administered 2017-12-19: 1000 mg via INTRAVENOUS
  Filled 2017-12-19: qty 50

## 2017-12-19 MED ORDER — HEPARIN SODIUM (PORCINE) 1000 UNIT/ML DIALYSIS
1000.0000 [IU] | INTRAMUSCULAR | Status: DC | PRN
  Filled 2017-12-19: qty 6

## 2017-12-19 MED ORDER — SODIUM BICARBONATE 8.4 % IV SOLN
100.0000 meq | Freq: Once | INTRAVENOUS | Status: AC
  Administered 2017-12-19: 100 meq via INTRAVENOUS
  Filled 2017-12-19: qty 100

## 2017-12-19 MED ORDER — SODIUM CHLORIDE 0.9 % FOR CRRT
INTRAVENOUS_CENTRAL | Status: DC | PRN
  Administered 2017-12-19: 02:00:00 via INTRAVENOUS_CENTRAL
  Filled 2017-12-19: qty 1000

## 2017-12-19 MED ORDER — ALTEPLASE 2 MG IJ SOLR: 2.0000 mg | Freq: Once | INTRAMUSCULAR | Status: DC | PRN

## 2017-12-19 MED ORDER — SODIUM CHLORIDE 0.9 % IV SOLN
INTRAVENOUS | Status: AC
  Administered 2017-12-19: 01:00:00 via INTRAVENOUS

## 2017-12-19 MED ORDER — PRISMASOL BGK 4/2.5 32-4-2.5 MEQ/L IV SOLN
INTRAVENOUS | Status: DC
  Administered 2017-12-19: 02:00:00 via INTRAVENOUS_CENTRAL
  Filled 2017-12-19 (×4): qty 5000

## 2017-12-19 DEATH — deceased

## 2017-12-20 LAB — CALCIUM, IONIZED

## 2017-12-21 ENCOUNTER — Telehealth: Payer: Self-pay

## 2017-12-21 NOTE — Telephone Encounter (Signed)
On 12/21/17 I received a dc from [employer] DC is for burial. Patient is a patient of Doctor Olalre. DC will be taken to Pulmonary Unit for signature.  On 12/22/17 I received dc back from Doctor Meriam Sprague. I got the dc ready and called the funeral home to let them know dc is ready for pickup.

## 2017-12-23 LAB — CULTURE, BLOOD (ROUTINE X 2)
Culture: NO GROWTH
Culture: NO GROWTH
Special Requests: ADEQUATE
Special Requests: ADEQUATE

## 2018-01-19 NOTE — Progress Notes (Signed)
Name: [name] [name]  Location: 61  Petra Kuba of Visit: End of life  Summary:  [name] was paged to offer and provide prayer. Chaplain sat with family before they went back into the room. Chaplain continues to near by.

## 2018-01-19 NOTE — Progress Notes (Addendum)
Assumed care of the patient post HD cath placement at approximately 2345. Orders received for CRRT therapy. Family at bedside and updated by MD and nursing of pt condition and plan of care. Family expressed wishes for pt status to be DNR and MD made aware. CRRT was initiated with family at bedside, within 30 minutes of therapy starting pt lost a pulse. Pt was pronounced at 241 and verified with 2nd RN. 182 ml of fentanyl drip was wasted in the waste container and witnessed with 2nd RN Tonette Lederer. The remaining unspiked fentanyl bag was returned and hand delivered to the pharmacy.

## 2018-01-19 NOTE — Progress Notes (Signed)
Pt. Asystole on monitor, no heart tones or breathe sounds noted on auscultation. Verified with second RN Genell. Time of death 33

## 2018-01-19 NOTE — Discharge Summary (Signed)
Physician Discharge Summary  Patient ID: [name] [name] MRN: [id number] DOB/AGE: 1928-07-17 85 y.o.  Admit date: 01-16-2018 Date of death: [id number]     Final diagnoses:   Septic shock Severe pancreatitis Acute kidney injury Severe metabolic acidosis  Other diagnoses Hypertension Mitral valve prolapse History of small bowel obstruction     Blind loop syndrome                 DISCHARGE SUMMARY  Patient was admitted 01-16-18 with abdominal pain began on the day of presentation with progression Working diagnosis on admission was acute pancreatitis-started on analgesics, antibiotics, fluid resuscitation Patient developed hypotension with concern for sepsis and worsening abdominal pain and was transferred to the stepdown unit for closer monitoring Critical care consulted for persistent hypotension- patient was started on pressors, continued fluid resuscitation, antibiotics.  She was started on BiPAP for shortness of breath. Optimization of pain management with opiates.  Family was at bedside and updated. Gastroenterology consultation-continue aggressive management for septic shock She however continued to deteriorate requiring increasing doses of pressors, With continuing deterioration patient was intubated with worsening septic shock, worsening respiratory failure and progressive kidney injury, profound metabolic acidosis Antibiotics escalated with addition of micafungin.  Vas-Cath was placed for CRRT Family was updated regarding patient's worsening life-threatening illness, patient status was updated to DO NOT RESUSCITATE status.  Patient continued to deteriorate and finally expired. Was pronounced dead at 0241 on 01-18-2018 Final diagnosis of septic shock   Signed: Sherrilyn Rist, MD [facility]

## 2018-01-19 NOTE — Procedures (Signed)
HD Venous Catheter Insertion Procedure Note JEANNINE PENNISI [id number] Nov 29, 1928  Procedure: Insertion of HD catheter , [facility]  Indications: EMERGENT CRRT Procedure Details Consent: Risks of procedure as well as the alternatives and risks of each were explained to the (patient/caregiver).  Consent for procedure obtained. Time Out: Verified patient identification, verified procedure, site/side was marked, verified correct patient position, special equipment/implants available, medications/allergies/relevent history reviewed, required imaging and test results available.  Performed  Maximum sterile technique was used including antiseptics, cap, gloves, gown, hand hygiene, mask and sheet. Skin prep: Chlorhexidine; local anesthetic administered A antimicrobial bonded/coated triple lumen catheter was placed in the left internal jugular vein using the Seldinger technique.  Evaluation Blood flow good Complications: No apparent complications Patient did tolerate procedure well. Chest X-ray ordered to verify placement.  CXR: tip of HD catheter in the SVC , no PTX.  Procedure performed under direct ultrasound guidance for real time vessel cannulation.      Taivon Haroon R. Synthia Fairbank MD 2018-01-02, 12:04 AM

## 2018-01-19 DEATH — deceased

## 2018-03-22 IMAGING — DX DG CHEST 1V PORT
1 series · 1 of 1 positions shown · non-contrast
Comparison: Portable exam 1526 hours compared to 07/05/2012

CLINICAL DATA: Hypertension, small-bowel obstruction, GERD, 2 days
of generalized abdominal pain with intermittent nausea and vomiting,
history hypertension and fatty liver

EXAM:
PORTABLE CHEST 1 VIEW

[chest ap]
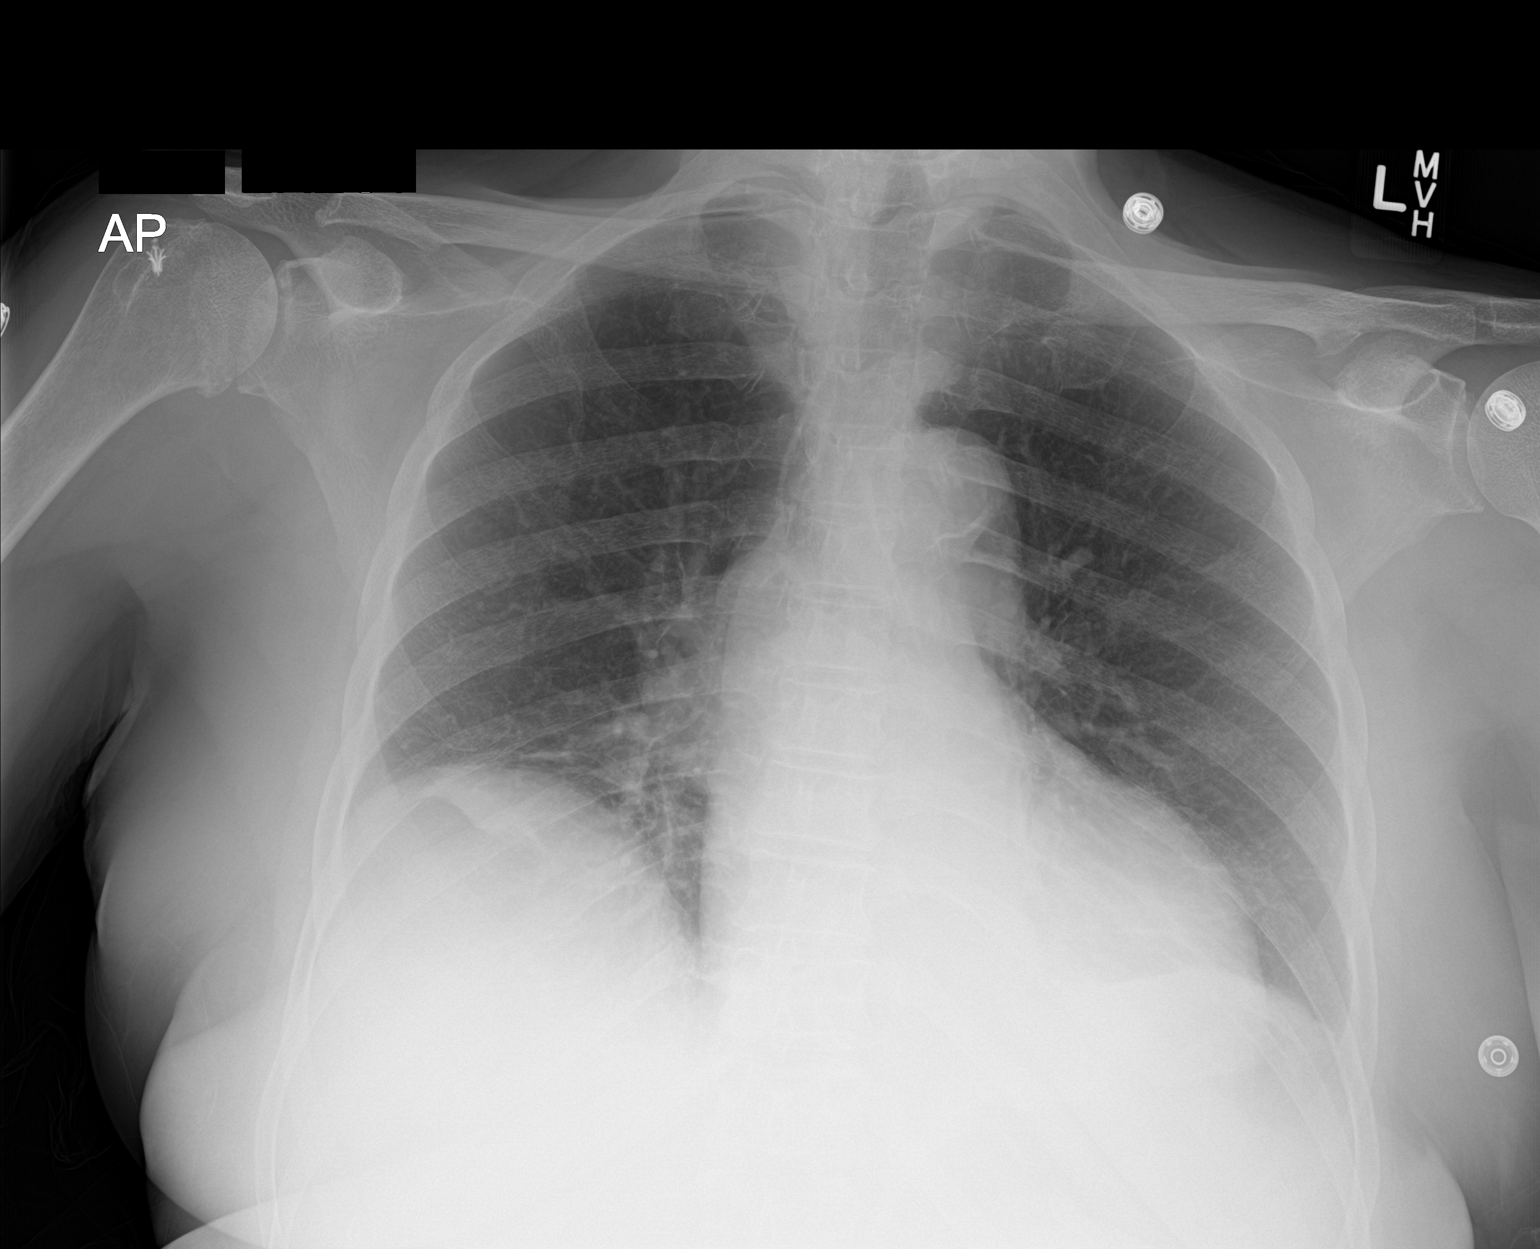

[1 of 1 positions shown; findings below may reference images not displayed]

FINDINGS: Upper normal size of cardiac silhouette.

Atherosclerotic calcification aorta.

Mediastinal contours and pulmonary vascularity normal.

Elevation of RIGHT diaphragm with RIGHT basilar atelectasis.

Remaining lungs clear.

No pleural effusion or pneumothorax.

Bones demineralized.
IMPRESSION: RIGHT basilar atelectasis.

Aortic atherosclerosis.

## 2018-03-24 IMAGING — DX DG ABD PORTABLE 1V
1 series · 1 of 1 positions shown · non-contrast
Comparison: [DATE].

CLINICAL DATA: NG tube placement.

EXAM:
PORTABLE ABDOMEN - 1 VIEW

[abdomen kub]
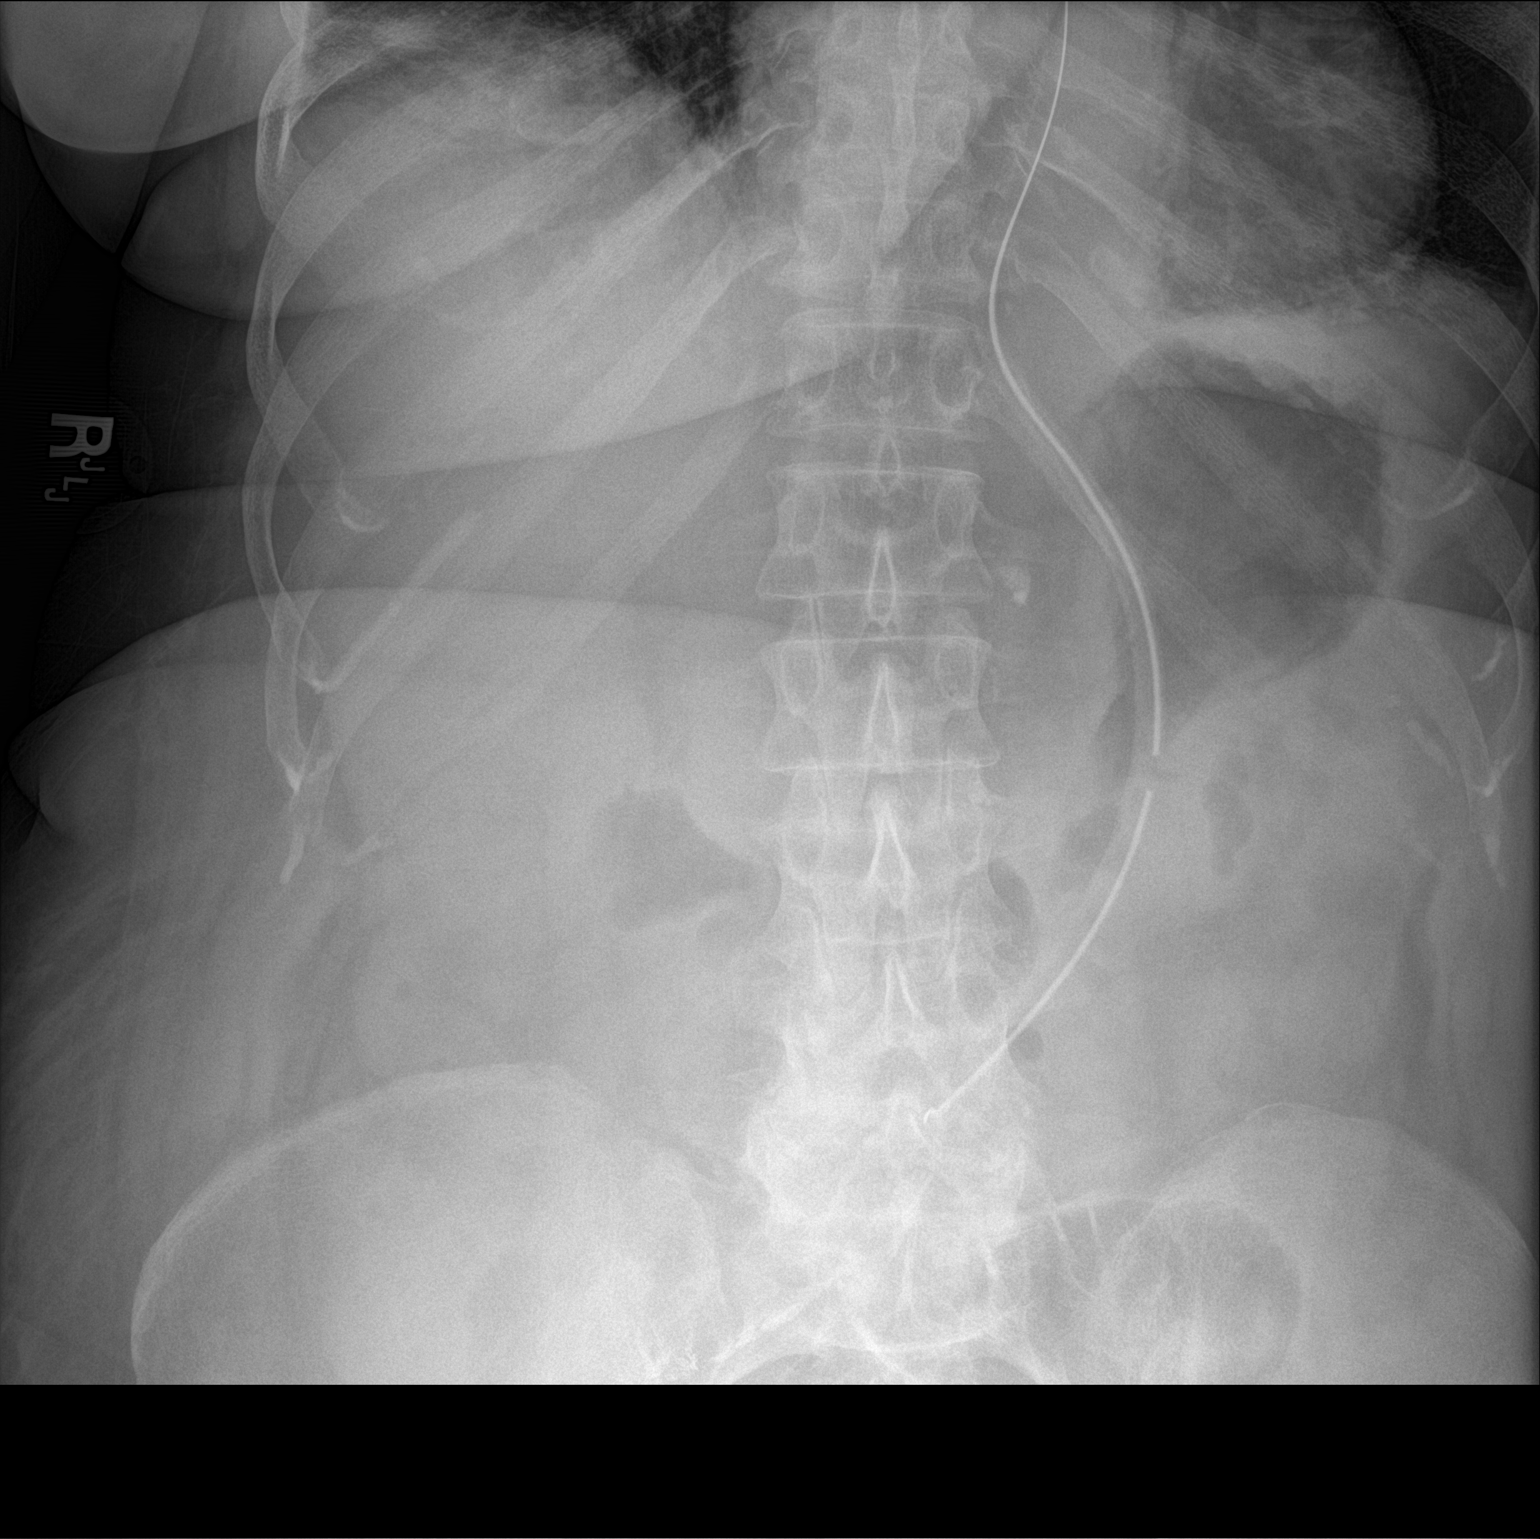

[1 of 1 positions shown; findings below may reference images not displayed]

FINDINGS: NG tube noted with its tip over the distal stomach. No bowel
distention. No free air. Basilar atelectasis and/or infiltrates
again noted. Vascular calcification.
IMPRESSION: 1. NG tube noted with its tip projected over the distal stomach. No
gastric or bowel distention.

2.  Basilar atelectasis and/or infiltrates.

## 2018-03-26 IMAGING — DX DG ABDOMEN 1V
1 series · 1 of 1 positions shown · non-contrast
Comparison: Radiograph June 17, 2016.

CLINICAL DATA: Acute lower abdominal pain.

EXAM:
ABDOMEN - 1 VIEW

[abdomen kub]
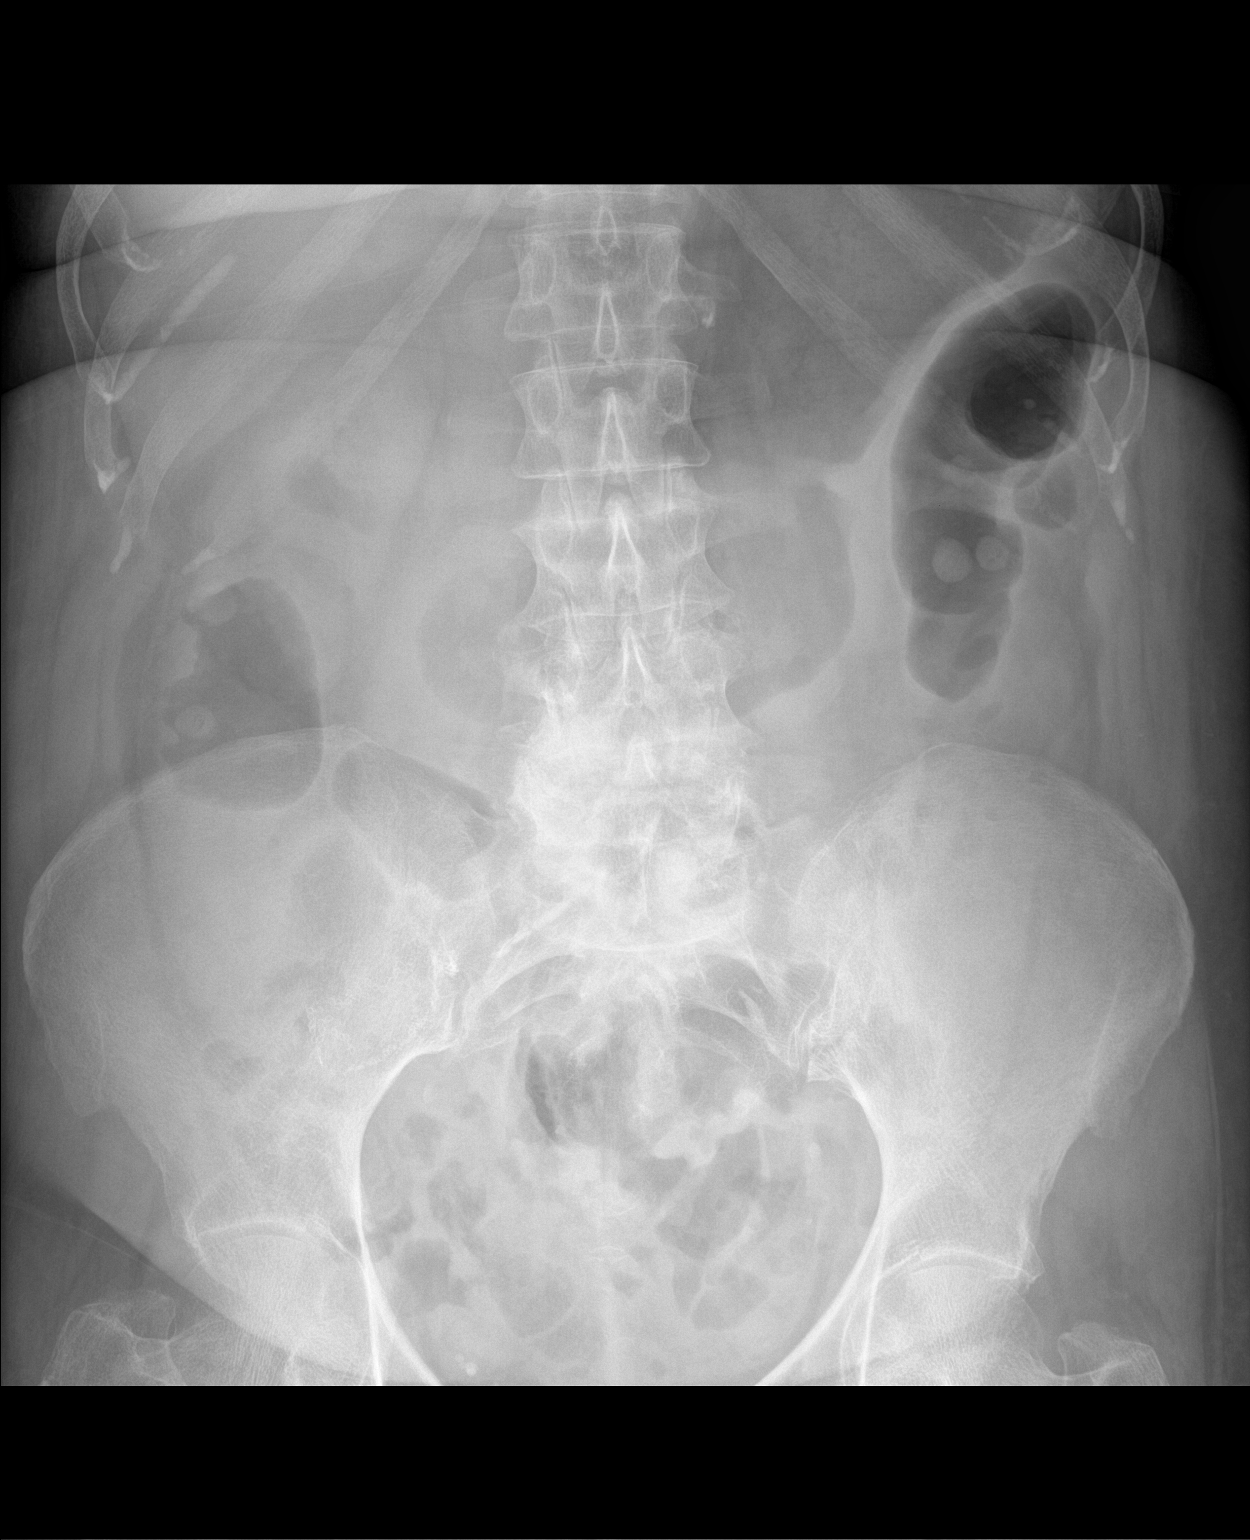

[1 of 1 positions shown; findings below may reference images not displayed]

FINDINGS: The bowel gas pattern is normal. Phleboliths are noted in the
pelvis.
IMPRESSION: No evidence of bowel obstruction or ileus.
